# Patient Record
Sex: Male | Born: 1980
Health system: Southern US, Community
[De-identification: ages and names within clinical notes are randomized; demographics above are authoritative.]

## PROBLEM LIST (undated history)

## (undated) DIAGNOSIS — K600 Acute anal fissure: Secondary | ICD-10-CM

## (undated) DIAGNOSIS — A0471 Enterocolitis due to Clostridium difficile, recurrent: Secondary | ICD-10-CM

## (undated) DIAGNOSIS — G4730 Sleep apnea, unspecified: Secondary | ICD-10-CM

## (undated) DIAGNOSIS — K219 Gastro-esophageal reflux disease without esophagitis: Secondary | ICD-10-CM

## (undated) DIAGNOSIS — K648 Other hemorrhoids: Secondary | ICD-10-CM

## (undated) DIAGNOSIS — Z79899 Other long term (current) drug therapy: Secondary | ICD-10-CM

## (undated) DIAGNOSIS — M1990 Unspecified osteoarthritis, unspecified site: Secondary | ICD-10-CM

## (undated) DIAGNOSIS — M9110 Juvenile osteochondrosis of head of femur [Legg-Calve-Perthes], unspecified leg: Secondary | ICD-10-CM

## (undated) DIAGNOSIS — K5190 Ulcerative colitis, unspecified, without complications: Secondary | ICD-10-CM

## (undated) DIAGNOSIS — E559 Vitamin D deficiency, unspecified: Secondary | ICD-10-CM

## (undated) DIAGNOSIS — K611 Rectal abscess: Secondary | ICD-10-CM

## (undated) HISTORY — DX: Rectal abscess: K61.1

## (undated) HISTORY — PX: KNEE ARTHROSCOPY: SUR90

## (undated) HISTORY — DX: Unspecified osteoarthritis, unspecified site: M19.90

## (undated) HISTORY — DX: Acute anal fissure: K60.0

## (undated) HISTORY — PX: HIP SURGERY: SHX245

## (undated) HISTORY — DX: Other hemorrhoids: K64.8

## (undated) HISTORY — DX: Juvenile osteochondrosis of head of femur (Legg-Calve-Perthes), unspecified leg: M91.10

## (undated) HISTORY — PX: TYMPANOSTOMY TUBE PLACEMENT: SHX32

## (undated) HISTORY — DX: Ulcerative colitis, unspecified, without complications: K51.90

## (undated) HISTORY — DX: Vitamin D deficiency, unspecified: E55.9

## (undated) HISTORY — DX: Enterocolitis due to Clostridium difficile, recurrent: A04.71

## (undated) HISTORY — DX: Sleep apnea, unspecified: G47.30

## (undated) HISTORY — DX: Gastro-esophageal reflux disease without esophagitis: K21.9

---
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
## 1998-11-25 ENCOUNTER — Emergency Department (HOSPITAL_COMMUNITY): Admission: EM | Admit: 1998-11-25 | Discharge: 1998-11-25 | Payer: Self-pay | Admitting: Emergency Medicine

## 2001-11-05 ENCOUNTER — Encounter: Payer: Self-pay | Admitting: Orthopedic Surgery

## 2001-11-05 ENCOUNTER — Ambulatory Visit (HOSPITAL_COMMUNITY): Admission: RE | Admit: 2001-11-05 | Discharge: 2001-11-05 | Payer: Self-pay | Admitting: Orthopedic Surgery

## 2010-12-28 ENCOUNTER — Other Ambulatory Visit (HOSPITAL_COMMUNITY): Payer: Self-pay | Admitting: Orthopedic Surgery

## 2010-12-28 ENCOUNTER — Ambulatory Visit (HOSPITAL_COMMUNITY)
Admission: RE | Admit: 2010-12-28 | Discharge: 2010-12-28 | Disposition: A | Discharge status: Home / Self Care | Payer: Managed Care, Other (non HMO) | Source: Ambulatory Visit | Attending: Orthopedic Surgery | Admitting: Orthopedic Surgery

## 2010-12-28 DIAGNOSIS — Z77018 Contact with and (suspected) exposure to other hazardous metals: Secondary | ICD-10-CM

## 2010-12-28 DIAGNOSIS — Z1389 Encounter for screening for other disorder: Secondary | ICD-10-CM | POA: Insufficient documentation

## 2013-10-19 ENCOUNTER — Ambulatory Visit (INDEPENDENT_AMBULATORY_CARE_PROVIDER_SITE_OTHER): Payer: Managed Care, Other (non HMO) | Admitting: Emergency Medicine

## 2013-10-19 VITALS — BP 134/86 | HR 88 | Temp 98.6°F | Resp 18 | Ht 65.5 in | Wt 217.0 lb

## 2013-10-19 DIAGNOSIS — J018 Other acute sinusitis: Secondary | ICD-10-CM

## 2013-10-19 DIAGNOSIS — J209 Acute bronchitis, unspecified: Secondary | ICD-10-CM

## 2013-10-19 MED ORDER — PROMETHAZINE-CODEINE 6.25-10 MG/5ML PO SYRP: 5.0000 mL | ORAL_SOLUTION | Freq: Four times a day (QID) | ORAL | Status: DC | PRN

## 2013-10-19 MED ORDER — PSEUDOEPHEDRINE-GUAIFENESIN ER 60-600 MG PO TB12: 1.0000 | ORAL_TABLET | Freq: Two times a day (BID) | ORAL | Status: AC

## 2013-10-19 MED ORDER — AMOXICILLIN-POT CLAVULANATE 875-125 MG PO TABS: 1.0000 | ORAL_TABLET | Freq: Two times a day (BID) | ORAL | Status: DC

## 2013-10-19 NOTE — Patient Instructions (Signed)
Bronchitis Bronchitis is the body's way of reacting to injury and/or infection (inflammation) of the bronchi. Bronchi are the air tubes that extend from the windpipe into the lungs. If the inflammation becomes severe, it may cause shortness of breath. CAUSES  Inflammation may be caused by:  A virus.  Germs (bacteria).  Dust.  Allergens.  Pollutants and many other irritants. The cells lining the bronchial tree are covered with tiny hairs (cilia). These constantly beat upward, away from the lungs, toward the mouth. This keeps the lungs free of pollutants. When these cells become too irritated and are unable to do their job, mucus begins to develop. This causes the characteristic cough of bronchitis. The cough clears the lungs when the cilia are unable to do their job. Without either of these protective mechanisms, the mucus would settle in the lungs. Then you would develop pneumonia. Smoking is a common cause of bronchitis and can contribute to pneumonia. Stopping this habit is the single most important thing you can do to help yourself. TREATMENT   Your caregiver may prescribe an antibiotic if the cough is caused by bacteria. Also, medicines that open up your airways make it easier to breathe. Your caregiver may also recommend or prescribe an expectorant. It will loosen the mucus to be coughed up. Only take over-the-counter or prescription medicines for pain, discomfort, or fever as directed by your caregiver.  Removing whatever causes the problem (smoking, for example) is critical to preventing the problem from getting worse.  Cough suppressants may be prescribed for relief of cough symptoms.  Inhaled medicines may be prescribed to help with symptoms now and to help prevent problems from returning.  For those with recurrent (chronic) bronchitis, there may be a need for steroid medicines. SEEK IMMEDIATE MEDICAL CARE IF:   During treatment, you develop more pus-like mucus (purulent  sputum).  You have a fever.  You become progressively more ill.  You have increased difficulty breathing, wheezing, or shortness of breath. It is necessary to seek immediate medical care if you are elderly or sick from any other disease. MAKE SURE YOU:   Understand these instructions.  Will watch your condition.  Will get help right away if you are not doing well or get worse. Document Released: 10/07/2005 Document Revised: 06/09/2013 Document Reviewed: 06/01/2013 [facility]ExitCare Patient Information 2014 [place], [place]. Sinusitis Sinusitis is redness, soreness, and swelling (inflammation) of the paranasal sinuses. Paranasal sinuses are air pockets within the bones of your face (beneath the eyes, the middle of the forehead, or above the eyes). In healthy paranasal sinuses, mucus is able to drain out, and air is able to circulate through them by way of your nose. However, when your paranasal sinuses are inflamed, mucus and air can become trapped. This can allow bacteria and other germs to grow and cause infection. Sinusitis can develop quickly and last only a short time (acute) or continue over a long period (chronic). Sinusitis that lasts for more than 12 weeks is considered chronic.  CAUSES  Causes of sinusitis include:  Allergies.  Structural abnormalities, such as displacement of the cartilage that separates your nostrils (deviated septum), which can decrease the air flow through your nose and sinuses and affect sinus drainage.  Functional abnormalities, such as when the small hairs (cilia) that line your sinuses and help remove mucus do not work properly or are not present. SYMPTOMS  Symptoms of acute and chronic sinusitis are the same. The primary symptoms are pain and pressure around the affected sinuses. Other  symptoms include:  Upper toothache.  Earache.  Headache.  Bad breath.  Decreased sense of smell and taste.  A cough, which worsens when you are lying  flat.  Fatigue.  Fever.  Thick drainage from your nose, which often is green and may contain pus (purulent).  Swelling and warmth over the affected sinuses. DIAGNOSIS  Your caregiver will perform a physical exam. During the exam, your caregiver may:  Look in your nose for signs of abnormal growths in your nostrils (nasal polyps).  Tap over the affected sinus to check for signs of infection.  View the inside of your sinuses (endoscopy) with a special imaging device with a light attached (endoscope), which is inserted into your sinuses. If your caregiver suspects that you have chronic sinusitis, one or more of the following tests may be recommended:  Allergy tests.  Nasal culture A sample of mucus is taken from your nose and sent to a lab and screened for bacteria.  Nasal cytology A sample of mucus is taken from your nose and examined by your caregiver to determine if your sinusitis is related to an allergy. TREATMENT  Most cases of acute sinusitis are related to a viral infection and will resolve on their own within 10 days. Sometimes medicines are prescribed to help relieve symptoms (pain medicine, decongestants, nasal steroid sprays, or saline sprays).  However, for sinusitis related to a bacterial infection, your caregiver will prescribe antibiotic medicines. These are medicines that will help kill the bacteria causing the infection.  Rarely, sinusitis is caused by a fungal infection. In theses cases, your caregiver will prescribe antifungal medicine. For some cases of chronic sinusitis, surgery is needed. Generally, these are cases in which sinusitis recurs more than 3 times per year, despite other treatments. HOME CARE INSTRUCTIONS   Drink plenty of water. Water helps thin the mucus so your sinuses can drain more easily.  Use a humidifier.  Inhale steam 3 to 4 times a day (for example, sit in the bathroom with the shower running).  Apply a warm, moist washcloth to your face 3  to 4 times a day, or as directed by your caregiver.  Use saline nasal sprays to help moisten and clean your sinuses.  Take over-the-counter or prescription medicines for pain, discomfort, or fever only as directed by your caregiver. SEEK IMMEDIATE MEDICAL CARE IF:  You have increasing pain or severe headaches.  You have nausea, vomiting, or drowsiness.  You have swelling around your face.  You have vision problems.  You have a stiff neck.  You have difficulty breathing. MAKE SURE YOU:   Understand these instructions.  Will watch your condition.  Will get help right away if you are not doing well or get worse. Document Released: 10/07/2005 Document Revised: 12/30/2011 Document Reviewed: 10/22/2011 [facility]ExitCare Patient Information 2014 [place]ExitCare, [place].

## 2013-10-19 NOTE — Progress Notes (Signed)
[facility] [address], [place] [address] [phone]  Date:  10/19/2013   Name:  John [name]   DOB:  08/07/1981   MRN:  [id number]  PCP:  No PCP Per Patient    Chief Complaint: URI   History of Present Illness:  John [name] is a 32 y.o. very pleasant male patient who presents with the following:  Ill since Saturday with cough and fever.  No myalgias or arthralgias.  Since Sunday has experienced a sore throat with odynophagia.  No nausea or vomiting.  No wheezing or shortness of breath.  Cough is productive a mucopurulent sputum.   Says he swallowed and felt something "pop" in his right neck that still hurts when he swallows.  No improvement with over the counter medications or other home remedies. Denies other complaint or health concern today.   There are no active problems to display for this patient.   History reviewed. No pertinent past medical history.  History reviewed. No pertinent past surgical history.  History  Substance Use Topics  . Smoking status: Never Smoker   . Smokeless tobacco: Not on file  . Alcohol Use: No    Family History  Problem Relation Age of Onset  . Diabetes Father   . Cancer Father   . Heart disease Father     No Known Allergies  Medication list has been reviewed and updated.  No current outpatient prescriptions on file prior to visit.   No current facility-administered medications on file prior to visit.    Review of Systems:  As per HPI, otherwise negative.    Physical Examination: Filed Vitals:   10/19/13 0900  BP: 134/86  Pulse: 88  Temp: 98.6 F (37 C)  Resp: 18   Filed Vitals:   10/19/13 0900  Height: 5' 5.5" (1.664 m)  Weight: 217 lb (98.431 kg)   Body mass index is 35.55 kg/(m^2). Ideal Body Weight: Weight in (lb) to have BMI = 25: 152.2  GEN: WDWN, NAD, Non-toxic, A & O x 3 HEENT: Atraumatic, Normocephalic. Neck supple. No masses, No LAD. Ears and Nose: No external deformity.   Purulent nasal drainage CV: RRR, No M/G/R. No JVD. No thrill. No extra heart sounds. PULM: CTA B, no wheezes, crackles, rhonchi. No retractions. No resp. distress. No accessory muscle use. ABD: S, NT, ND, +BS. No rebound. No HSM. EXTR: No c/c/e NEURO Normal gait.  PSYCH: Normally interactive. Conversant. Not depressed or anxious appearing.  Calm demeanor.    Assessment and Plan: Sinusitis Bronchitis augmentin mucinex d Phen c cod  Signed,  Phillips Odor, MD

## 2013-11-15 ENCOUNTER — Encounter: Payer: Self-pay | Admitting: Family Medicine

## 2013-11-15 ENCOUNTER — Ambulatory Visit (INDEPENDENT_AMBULATORY_CARE_PROVIDER_SITE_OTHER): Payer: Managed Care, Other (non HMO) | Admitting: Family Medicine

## 2013-11-15 VITALS — BP 116/80 | HR 74 | Temp 98.1°F | Resp 16 | Ht 65.0 in | Wt 213.6 lb

## 2013-11-15 DIAGNOSIS — R0683 Snoring: Secondary | ICD-10-CM

## 2013-11-15 DIAGNOSIS — R0989 Other specified symptoms and signs involving the circulatory and respiratory systems: Secondary | ICD-10-CM

## 2013-11-15 DIAGNOSIS — G4710 Hypersomnia, unspecified: Secondary | ICD-10-CM

## 2013-11-15 DIAGNOSIS — R400 Somnolence: Secondary | ICD-10-CM

## 2013-11-15 DIAGNOSIS — R5383 Other fatigue: Secondary | ICD-10-CM

## 2013-11-15 DIAGNOSIS — Z0000 Encounter for general adult medical examination without abnormal findings: Secondary | ICD-10-CM

## 2013-11-15 DIAGNOSIS — R0609 Other forms of dyspnea: Secondary | ICD-10-CM

## 2013-11-15 DIAGNOSIS — Z23 Encounter for immunization: Secondary | ICD-10-CM

## 2013-11-15 DIAGNOSIS — R5381 Other malaise: Secondary | ICD-10-CM

## 2013-11-15 LAB — CBC
HCT: 42.8 % (ref 39.0–52.0)
Hemoglobin: 14.9 g/dL (ref 13.0–17.0)
MCH: 30.2 pg (ref 26.0–34.0)
MCHC: 34.8 g/dL (ref 30.0–36.0)
MCV: 86.8 fL (ref 78.0–100.0)
Platelets: 287 10*3/uL (ref 150–400)
RBC: 4.93 MIL/uL (ref 4.22–5.81)
RDW: 13.8 % (ref 11.5–15.5)
WBC: 8.5 10*3/uL (ref 4.0–10.5)

## 2013-11-15 NOTE — Progress Notes (Signed)
Subjective:    Patient ID: John [name], male    DOB: 1981/09/28, 33 y.o.   MRN: [id number]  HPI John [name] is a 33 y.o. male  Here for CPE/annual exam. Fasting currently.   Tetanus/Tdap: over 10 years.  Flu vaccine: declines, no questions or concerns why. Just does not want done.  Dentist: last week. Optho/eye care eval: wears glasses. Due for exam. Last seen about a year ago.    FH: HTN and DM2 in both sides of family. Has not had physical recently. Father diagnosed with colon CA at 31yo.  Granfather with prostate cancer in late 60's-70's.   Hx of seasonal allergies - otc antihistamine if needed   Fatigue - all the time - past year. Does snore, but no known pauses. Daytime somnolence past year and half. Sleeping 7-8 hours per night sleep. Wakes up few times at night if hears noises. Weight 2-5 to 215 past 6-7 years. Denies depression, no other stressors.   There are no active problems to display for this patient.  No past medical history on file. No past surgical history on file. No Known Allergies Prior to Admission medications   Medication Sig Start Date End Date Taking? Authorizing Provider  amoxicillin-clavulanate (AUGMENTIN) 875-125 MG per tablet Take 1 tablet by mouth 2 (two) times daily. 10/19/13   Ellison Carwin, MD  promethazine-codeine (PHENERGAN WITH CODEINE) 6.25-10 MG/5ML syrup Take 5-10 mLs by mouth every 6 (six) hours as needed. 10/19/13   Ellison Carwin, MD  pseudoephedrine-guaifenesin [facility] D) 60-600 MG per tablet Take 1 tablet by mouth every 12 (twelve) hours. 10/19/13 10/19/14  Ellison Carwin, MD   History   Social History  . Marital Status: Married    Spouse Name: N/A    Number of Children: N/A  . Years of Education: N/A   Occupational History  . Not on file.   Social History Main Topics  . Smoking status: Never Smoker   . Smokeless tobacco: Not on file  . Alcohol Use: No  . Drug Use: No  . Sexual Activity: Yes   Other Topics  Concern  . Not on file   Social History Narrative  . 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 Nonsmoker. Married 70 years, 58 month old girl at home. No alcohol, no IDU.  Denies extramarital sexual contact, declines STI.    Review of Systems 13 point review of systems per patient health survey noted.  Negative other than as indicated on reviewed nursing note or above.      Objective:   Physical Exam  Vitals reviewed. Constitutional: He is oriented to person, place, and time. He appears well-developed and well-nourished.  HENT:  Head: Normocephalic and atraumatic.  Right Ear: External ear normal.  Left Ear: External ear normal.  Mouth/Throat: Oropharynx is clear and moist.  Eyes: Conjunctivae and EOM are normal. Pupils are equal, round, and reactive to light.  Neck: Normal range of motion. Neck supple. No thyromegaly present.  Cardiovascular: Normal rate, regular rhythm, normal heart sounds and intact distal pulses.   Pulmonary/Chest: Effort normal and breath sounds normal. No respiratory distress. He has no wheezes.  Abdominal: Soft. He exhibits no distension. There is no tenderness. Hernia confirmed negative in the right inguinal area and confirmed negative in the left inguinal area.  Musculoskeletal: Normal range of motion. He exhibits no edema and no tenderness.  Lymphadenopathy:    He has no cervical adenopathy.  Neurological: He is alert and oriented to person,  place, and time. He has normal reflexes.  Skin: Skin is warm and dry.  Psychiatric: He has a normal mood and affect. His behavior is normal.   Filed Vitals:   11/15/13 1503  BP: 116/80  Pulse: 74  Temp: 98.1 F (36.7 C)  TempSrc: Oral  Resp: 16  Height: 5\' 5"  (1.651 m)  Weight: 213 lb 9.6 oz (96.888 kg)  SpO2: 97%    Visual Acuity Screening   Right eye Left eye Both eyes  Without correction:     With correction: 20/20 20/20 20/20        Assessment & Plan:  John [name] is a 33 y.o.  male Snoring Daytime somnolence - Plan: Ambulatory referral to Sleep Studies for possible OSA. Return after this if fatigue persists.  Will check TSH, CBC in the meantime.   Need for Tdap vaccination - Plan: Tdap vaccine greater than or equal to 7yo IM given.   Annual physical exam - Plan: COMPLETE METABOLIC PANEL WITH GFR, Lipid panel, TSH, CBC, -anticipatory guidance provided below, labs above. Declined STI testing.    Patient Instructions  You should receive a call or letter about your lab results within the next week to 10 days.  tdap given today - repeat in 10 years (or after 5 years with injury).  We will refer you to sleep studies for evaluation for sleep apnea.  Return to the clinic or go to the nearest emergency room if any of your symptoms worsen or new symptoms occur.  Keeping you healthy  Get these tests  Blood pressure- Have your blood pressure checked once a year by your healthcare provider.  Normal blood pressure is 120/80.  Weight- Have your body mass index (BMI) calculated to screen for obesity.  BMI is a measure of body fat based on height and weight. You can also calculate your own BMI at [employer]velBags.it.  Cholesterol- Have your cholesterol checked regularly starting at age 35, sooner may be necessary if you have diabetes, high blood pressure, if a family member developed heart diseases at an early age or if you smoke.   Chlamydia, HIV, and other sexual transmitted disease- Get screened each year until the age of 34 then within three months of each new sexual partner.  Diabetes- Have your blood sugar checked regularly if you have high blood pressure, high cholesterol, a family history of diabetes or if you are overweight.  Get these vaccines  Flu shot- Every fall.  Tetanus shot- Every 10 years.  Menactra- Single dose; prevents meningitis.  Take these steps  Don't smoke- If you do smoke, ask your healthcare provider about quitting. For tips on how to  quit, go to www.smokefree.gov or call 1-800-QUIT-NOW.  Be physically active- Exercise 5 days a week for at least 30 minutes.  If you are not already physically active start slow and gradually work up to 30 minutes of moderate physical activity.  Examples of moderate activity include walking briskly, mowing the yard, dancing, swimming bicycling, etc.  Eat a healthy diet- Eat a variety of healthy foods such as fruits, vegetables, low fat milk, low fat cheese, yogurt, lean meats, poultry, fish, beans, tofu, etc.  For more information on healthy eating, go to www.thenutritionsource.org  Drink alcohol in moderation- Limit alcohol intake two drinks or less a day.  Never drink and drive.  Dentist- Brush and floss teeth twice daily; visit your dentis twice a year.  Depression-Your emotional health is as important as your physical health.  If you're  feeling down, losing interest in things you normally enjoy please talk with your healthcare provider.  Gun Safety- If you keep a gun in your home, keep it unloaded and with the safety lock on.  Bullets should be stored separately.  Helmet use- Always wear a helmet when riding a motorcycle, bicycle, rollerblading or skateboarding.  Safe sex- If you may be exposed to a sexually transmitted infection, use a condom  Seat belts- Seat bels can save your life; always wear one.  Smoke/Carbon Monoxide detectors- These detectors need to be installed on the appropriate level of your home.  Replace batteries at least once a year.  Skin Cancer- When out in the sun, cover up and use sunscreen SPF 15 or higher.  Violence- If anyone is threatening or hurting you, please tell your healthcare provider.

## 2013-11-15 NOTE — Patient Instructions (Signed)
You should receive a call or letter about your lab results within the next week to 10 days.  tdap given today - repeat in 10 years (or after 5 years with injury).  We will refer you to sleep studies for evaluation for sleep apnea.  Return to the clinic or go to the nearest emergency room if any of your symptoms worsen or new symptoms occur.  Keeping you healthy  Get these tests  Blood pressure- Have your blood pressure checked once a year by your healthcare provider.  Normal blood pressure is 120/80.  Weight- Have your body mass index (BMI) calculated to screen for obesity.  BMI is a measure of body fat based on height and weight. You can also calculate your own BMI at [employer]ags.it.  Cholesterol- Have your cholesterol checked regularly starting at age 43, sooner may be necessary if you have diabetes, high blood pressure, if a family member developed heart diseases at an early age or if you smoke.   Chlamydia, HIV, and other sexual transmitted disease- Get screened each year until the age of 65 then within three months of each new sexual partner.  Diabetes- Have your blood sugar checked regularly if you have high blood pressure, high cholesterol, a family history of diabetes or if you are overweight.  Get these vaccines  Flu shot- Every fall.  Tetanus shot- Every 10 years.  Menactra- Single dose; prevents meningitis.  Take these steps  Don't smoke- If you do smoke, ask your healthcare provider about quitting. For tips on how to quit, go to www.smokefree.gov or call 1-800-QUIT-NOW.  Be physically active- Exercise 5 days a week for at least 30 minutes.  If you are not already physically active start slow and gradually work up to 30 minutes of moderate physical activity.  Examples of moderate activity include walking briskly, mowing the yard, dancing, swimming bicycling, etc.  Eat a healthy diet- Eat a variety of healthy foods such as fruits, vegetables, low fat milk, low  fat cheese, yogurt, lean meats, poultry, fish, beans, tofu, etc.  For more information on healthy eating, go to www.thenutritionsource.org  Drink alcohol in moderation- Limit alcohol intake two drinks or less a day.  Never drink and drive.  Dentist- Brush and floss teeth twice daily; visit your dentis twice a year.  Depression-Your emotional health is as important as your physical health.  If you're feeling down, losing interest in things you normally enjoy please talk with your healthcare provider.  Gun Safety- If you keep a gun in your home, keep it unloaded and with the safety lock on.  Bullets should be stored separately.  Helmet use- Always wear a helmet when riding a motorcycle, bicycle, rollerblading or skateboarding.  Safe sex- If you may be exposed to a sexually transmitted infection, use a condom  Seat belts- Seat bels can save your life; always wear one.  Smoke/Carbon Monoxide detectors- These detectors need to be installed on the appropriate level of your home.  Replace batteries at least once a year.  Skin Cancer- When out in the sun, cover up and use sunscreen SPF 15 or higher.  Violence- If anyone is threatening or hurting you, please tell your healthcare provider.

## 2013-11-15 NOTE — Progress Notes (Signed)
   Subjective:    Patient ID: John [name], male    DOB: [birth year], 33 y.o.   MRN: [id number]  HPI    Review of Systems  Constitutional: Positive for activity change and fatigue.  HENT: Negative.   Eyes: Negative.   Respiratory: Negative.   Cardiovascular: Negative.   Gastrointestinal: Negative.   Endocrine: Negative.   Genitourinary: Negative.   Musculoskeletal: Negative.   Skin: Negative.   Allergic/Immunologic: Positive for environmental allergies.  Neurological: Negative.   Hematological: Negative.   Psychiatric/Behavioral: Negative.        Objective:   Physical Exam        Assessment & Plan:

## 2013-11-16 LAB — COMPLETE METABOLIC PANEL WITH GFR
ALT: 34 U/L (ref 0–53)
AST: 28 U/L (ref 0–37)
Albumin: 5 g/dL (ref 3.5–5.2)
Alkaline Phosphatase: 56 U/L (ref 39–117)
BUN: 13 mg/dL (ref 6–23)
CO2: 24 mEq/L (ref 19–32)
Calcium: 10.2 mg/dL (ref 8.4–10.5)
Chloride: 101 mEq/L (ref 96–112)
Creat: 0.92 mg/dL (ref 0.50–1.35)
GFR, Est African American: >89 mL/min
GFR, Est Non African American: >89 mL/min
Glucose, Bld: 83 mg/dL (ref 70–99)
Potassium: 4.2 mEq/L (ref 3.5–5.3)
Sodium: 140 mEq/L (ref 135–145)
Total Bilirubin: 1.3 mg/dL — ABNORMAL HIGH (ref 0.3–1.2)
Total Protein: 8.4 g/dL — ABNORMAL HIGH (ref 6.0–8.3)

## 2013-11-16 LAB — LIPID PANEL
Cholesterol: 187 mg/dL (ref 0–200)
HDL: 33 mg/dL — ABNORMAL LOW (ref >39)
LDL Cholesterol: 105 mg/dL — ABNORMAL HIGH (ref 0–99)
Total CHOL/HDL Ratio: 5.7 Ratio
Triglycerides: 243 mg/dL — ABNORMAL HIGH (ref <150)
VLDL: 49 mg/dL — ABNORMAL HIGH (ref 0–40)

## 2013-11-16 LAB — TSH: TSH: 3.324 u[IU]/mL (ref 0.350–4.500)

## 2014-06-07 ENCOUNTER — Encounter: Payer: Self-pay | Admitting: *Deleted

## 2014-06-08 ENCOUNTER — Ambulatory Visit (INDEPENDENT_AMBULATORY_CARE_PROVIDER_SITE_OTHER): Payer: Managed Care, Other (non HMO) | Admitting: Internal Medicine

## 2014-06-08 ENCOUNTER — Encounter: Payer: Self-pay | Admitting: Internal Medicine

## 2014-06-08 VITALS — BP 118/74 | HR 84 | Ht 65.0 in | Wt 217.0 lb

## 2014-06-08 DIAGNOSIS — Z800 Family history of malignant neoplasm of digestive organs: Secondary | ICD-10-CM

## 2014-06-08 DIAGNOSIS — K219 Gastro-esophageal reflux disease without esophagitis: Secondary | ICD-10-CM

## 2014-06-08 DIAGNOSIS — K648 Other hemorrhoids: Secondary | ICD-10-CM

## 2014-06-08 HISTORY — DX: Gastro-esophageal reflux disease without esophagitis: K21.9

## 2014-06-08 HISTORY — DX: Other hemorrhoids: K64.8

## 2014-06-08 MED ORDER — HYDROCORTISONE ACETATE 25 MG RE SUPP: 25.0000 mg | Freq: Every day | RECTAL | Status: DC

## 2014-06-08 NOTE — Progress Notes (Signed)
Subjective:    Patient ID: John [name], male    DOB: 1981-01-11, 33 y.o.   MRN: [id number]  HPI John is here with c/o intermittent streaking of bright red blood on stool for a few months. No constipation, strainig, etc. Has some concern because father ad grandfather had colon cancer. Has not had a colonoscopy yet.  No Known Allergies Outpatient Prescriptions Prior to Visit  Medication Sig Dispense Refill  . amoxicillin-clavulanate (AUGMENTIN) 875-125 MG per tablet Take 1 tablet by mouth 2 (two) times daily.  20 tablet  0  . promethazine-codeine (PHENERGAN WITH CODEINE) 6.25-10 MG/5ML syrup Take 5-10 mLs by mouth every 6 (six) hours as needed.  120 mL  0  . pseudoephedrine-guaifenesin (MUCINEX D) 60-600 MG per tablet Take 1 tablet by mouth every 12 (twelve) hours.  18 tablet  0   No facility-administered medications prior to visit.   Past Medical History  Diagnosis Date  . GERD (gastroesophageal reflux disease)   . 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 Esophageal reflux 06/08/2014  . Hemorrhoids, internal, with bleeding 06/08/2014   Past Surgical History  Procedure Laterality Date  . Knee arthroscopy Left    History   Social History  . Marital Status: Married    Spouse Name: N/A    Number of Children: 1  . Years of Education: N/A   Occupational History  . Diesel Statistician   Social History Main Topics  . Smoking status: Never Smoker   . Smokeless tobacco: Never Used  . Alcohol Use: No  . Drug Use: No  . Sexual Activity: Yes          Social History Narrative   Married (Dottie [place] CMA [facility]), 1 daughter born 2014   Engineer, building services [place]   Enjoys softball   Family History  Problem Relation Age of Onset  . Diabetes Father   . Colon cancer Father   . Heart disease Father   . Hyperlipidemia Father   . Heart disease Mother   . Hyperlipidemia Mother   . Heart disease Brother   . Heart disease Maternal Grandmother   . Heart disease  Maternal Grandfather   . Mental illness Maternal Grandfather   . Heart disease Paternal Grandmother   . Heart disease Paternal Grandfather   . Colon cancer Paternal Grandfather    Review of Systems URI - on Tx    Objective:   Physical Exam General:  Well-developed, well-nourished and in no acute distress Eyes:  anicteric. Lungs: Clear to auscultation bilaterally. Heart:  S1S2, no rubs, murmurs, gallops. Abdomen:  soft, non-tender, no hepatosplenomegaly, hernia, or mass and BS+.   Rectal: NL anoderm, slightly increased tone, no mass, prostate NL  Neuro:  A&O x 3.  Psych:  appropriate mood and  Affect.   Anoscopy was performed with the patient in the left lateral decubitus positionand revealed Grade I internal hemorrhoids all positions  Data Reviewed: Lab Results  Component Value Date   WBC 8.5 11/15/2013   HGB 14.9 11/15/2013   HCT 42.8 11/15/2013   MCV 86.8 11/15/2013   PLT 287 11/15/2013       Assessment & Plan:  Hemorrhoids, internal, with bleeding Grade 1 all 3 positions Treat w/ HC suppository Reserve banding for recurrent and persistent problems  Family history of colon cancer - father and paternal grandfather Colonoscopy by 52 - with recent sxs reasonable to do now though I think bleeding hemorrhoidal. I told  him to let me know if he wants to schedule and that if bleeding does not stop with [facility] suppositories then definitely need to schedule colonoscopy.  Esophageal reflux Intermittent sxs Try diet change with reduction of caffeine, prn antacids for now

## 2014-06-08 NOTE — Patient Instructions (Signed)
Today you have been given a handout to read on hemorrhoids.  We have sent the following medications to your pharmacy for you to pick up at your convenience: Rectal suppositories  Try to decrease your caffeine consumption.  If you decide to do a colonoscopy let us know and we will get this set up for you.   I appreciate the opportunity to care for you.

## 2014-06-09 DIAGNOSIS — Z800 Family history of malignant neoplasm of digestive organs: Secondary | ICD-10-CM | POA: Insufficient documentation

## 2014-06-09 NOTE — Assessment & Plan Note (Signed)
Grade 1 all 3 positions Treat w/ HC suppository Reserve banding for recurrent and persistent problems

## 2014-06-09 NOTE — Assessment & Plan Note (Signed)
Intermittent sxs Try diet change with reduction of caffeine, prn antacids for now

## 2014-06-09 NOTE — Assessment & Plan Note (Signed)
Colonoscopy by 23 - with recent sxs reasonable to do now though I think bleeding hemorrhoidal. I told him to let me know if he wants to schedule and that if bleeding does not stop with HC suppositories then definitely need to schedule colonoscopy.

## 2014-09-28 ENCOUNTER — Other Ambulatory Visit: Payer: Self-pay

## 2014-09-28 DIAGNOSIS — K625 Hemorrhage of anus and rectum: Secondary | ICD-10-CM

## 2014-09-28 MED ORDER — PEG-KCL-NACL-NASULF-NA ASC-C 100 G PO SOLR: 1.0000 | Freq: Once | ORAL | Status: AC

## 2014-10-20 ENCOUNTER — Encounter: Payer: Self-pay | Admitting: Internal Medicine

## 2014-11-11 ENCOUNTER — Encounter: Payer: Managed Care, Other (non HMO) | Admitting: Internal Medicine

## 2014-11-18 ENCOUNTER — Encounter: Payer: Self-pay | Admitting: Internal Medicine

## 2014-11-18 ENCOUNTER — Ambulatory Visit (AMBULATORY_SURGERY_CENTER): Payer: Managed Care, Other (non HMO) | Admitting: Internal Medicine

## 2014-11-18 VITALS — BP 124/76 | HR 79 | Temp 99.2°F | Resp 17 | Ht 65.0 in | Wt 217.0 lb

## 2014-11-18 DIAGNOSIS — Z800 Family history of malignant neoplasm of digestive organs: Secondary | ICD-10-CM

## 2014-11-18 DIAGNOSIS — Z1211 Encounter for screening for malignant neoplasm of colon: Secondary | ICD-10-CM

## 2014-11-18 MED ORDER — SODIUM CHLORIDE 0.9 % IV SOLN: 500.0000 mL | INTRAVENOUS | Status: DC

## 2014-11-18 NOTE — Patient Instructions (Signed)
YOU HAD AN ENDOSCOPIC PROCEDURE TODAY AT THE [facility]: Refer to the procedure report that was given to you for any specific questions about what was found during the examination.  If the procedure report does not answer your questions, please call your gastroenterologist to clarify.  If you requested that your care partner not be given the details of your procedure findings, then the procedure report has been included in a sealed envelope for you to review at your convenience later.  YOU SHOULD EXPECT: Some feelings of bloating in the abdomen. Passage of more gas than usual.  Walking can help get rid of the air that was put into your GI tract during the procedure and reduce the bloating. If you had a lower endoscopy (such as a colonoscopy or flexible sigmoidoscopy) you may notice spotting of blood in your stool or on the toilet paper. If you underwent a bowel prep for your procedure, then you may not have a normal bowel movement for a few days.  DIET: Your first meal following the procedure should be a light meal and then it is ok to progress to your normal diet.  A half-sandwich or bowl of soup is an example of a good first meal.  Heavy or fried foods are harder to digest and may make you feel nauseous or bloated.  Likewise meals heavy in dairy and vegetables can cause extra gas to form and this can also increase the bloating.  Drink plenty of fluids but you should avoid alcoholic beverages for 24 hours.  ACTIVITY: Your care partner should take you home directly after the procedure.  You should plan to take it easy, moving slowly for the rest of the day.  You can resume normal activity the day after the procedure however you should NOT DRIVE or use heavy machinery for 24 hours (because of the sedation medicines used during the test).    SYMPTOMS TO REPORT IMMEDIATELY: A gastroenterologist can be reached at any hour.  During normal business hours, 8:30 AM to 5:00 PM Monday through Friday,  call [phone].  After hours and on weekends, please call the GI answering service at [phone] who will take a message and have the physician on call contact you.   Following lower endoscopy (colonoscopy or flexible sigmoidoscopy):  Excessive amounts of blood in the stool  Significant tenderness or worsening of abdominal pains  Swelling of the abdomen that is new, acute  Fever of 100F or higher    FOLLOW UP: If any biopsies were taken you will be contacted by phone or by letter within the next 1-3 weeks.  Call your gastroenterologist if you have not heard about the biopsies in 3 weeks.  Our staff will call the home number listed on your records the next business day following your procedure to check on you and address any questions or concerns that you may have at that time regarding the information given to you following your procedure. This is a courtesy call and so if there is no answer at the home number and we have not heard from you through the emergency physician on call, we will assume that you have returned to your regular daily activities without incident.  SIGNATURES/CONFIDENTIALITY: You and/or your care partner have signed paperwork which will be entered into your electronic medical record.  These signatures attest to the fact that that the information above on your After Visit Summary has been reviewed and is understood.  Full responsibility of the confidentiality   of this discharge information lies with you and/or your care-partner.     

## 2014-11-18 NOTE — Op Note (Signed)
[place]  [address] [place], [id number]   COLONOSCOPY PROCEDURE REPORT  PATIENT: [name], John A  MR#: [id number] BIRTHDATE: 1981-03-27 , 33  yrs. old GENDER: male ENDOSCOPIST: Gatha Mayer, MD, [facility] PROCEDURE DATE:  11/18/2014 PROCEDURE:   Colonoscopy, screening First Screening Colonoscopy - Avg.  risk and is 50 yrs.  old or older Yes.  Prior Negative Screening - Now for repeat screening. N/A  History of Adenoma - Now for follow-up colonoscopy & has been > or = to 3 yrs.  N/A  Polyps Removed Today? No.  Polyps Removed Today? No.  Recommend repeat exam, <10 yrs? Polyps Removed Today? No.  Recommend repeat exam, <10 yrs? Yes.  Polyps Removed Today? No.  Recommend repeat exam, <10 yrs? Yes.  High risk (family or personal hx). ASA CLASS:   Class II INDICATIONS:first colonoscopy and patient's immediate family history of colon cancer. MEDICATIONS: Propofol 300 mg IV and Monitored anesthesia care  DESCRIPTION OF PROCEDURE:   After the risks benefits and alternatives of the procedure were thoroughly explained, informed consent was obtained.  The digital rectal exam revealed no abnormalities of the rectum, revealed no prostatic nodules, and revealed the prostate was not enlarged.   The LB [id number] endoscope was introduced through the anus and advanced to the cecum, which was identified by both the appendix and ileocecal valve. No adverse events experienced.   The quality of the prep was excellent, using MoviPrep  The instrument was then slowly withdrawn as the colon was fully examined.      COLON FINDINGS: The colonic mucosa appeared normal throughout. Small internal hemorrhoids were found in the rectum.Marland Kitchen  Retroflexed views rectal and right colon revealed no new abnormalities. The time to cecum=2 minutes 25 seconds.  Withdrawal time=9 minutes 32 seconds. The scope was withdrawn and the procedure completed. COMPLICATIONS: There were no immediate  complications.  ENDOSCOPIC IMPRESSION: 1.   The colonic mucosa appeared normal w/ excellent prep 2.   Small internal hemorrhoids in rectum  RECOMMENDATIONS: 1.  Repeat colonoscopy in 5 years. 2.  Consider hemorrhoid banding if desired  eSigned:  Gatha Mayer, MD, [facility] 11/18/2014 2:23 PM   cc: The Patient

## 2014-11-18 NOTE — Progress Notes (Signed)
Procedure ends, to recovery, report given and VSS. 

## 2014-11-21 ENCOUNTER — Telehealth: Payer: Self-pay

## 2014-11-21 NOTE — Telephone Encounter (Signed)
  Follow up Call-  Call back number 11/18/2014  Post procedure Call Back phone  # [phone] or call Dottie on 3rd floor  Permission to leave phone message No     Patient questions:  Do you have a fever, pain , or abdominal swelling? No. Pain Score  0 *  Have you tolerated food without any problems? Yes.    Have you been able to return to your normal activities? Yes.    Do you have any questions about your discharge instructions: Diet   No. Medications  No. Follow up visit  No.  Do you have questions or concerns about your Care? No.  Actions: * If pain score is 4 or above: No action needed, pain <4.

## 2015-04-03 ENCOUNTER — Encounter: Payer: Self-pay | Admitting: Physician Assistant

## 2015-04-03 ENCOUNTER — Encounter (INDEPENDENT_AMBULATORY_CARE_PROVIDER_SITE_OTHER): Payer: Self-pay

## 2015-04-03 ENCOUNTER — Telehealth: Payer: Self-pay | Admitting: Internal Medicine

## 2015-04-03 ENCOUNTER — Ambulatory Visit (INDEPENDENT_AMBULATORY_CARE_PROVIDER_SITE_OTHER): Payer: Managed Care, Other (non HMO) | Admitting: Physician Assistant

## 2015-04-03 ENCOUNTER — Other Ambulatory Visit (INDEPENDENT_AMBULATORY_CARE_PROVIDER_SITE_OTHER): Payer: Managed Care, Other (non HMO)

## 2015-04-03 VITALS — BP 128/68 | HR 80 | Ht 65.0 in | Wt 218.1 lb

## 2015-04-03 DIAGNOSIS — K649 Unspecified hemorrhoids: Secondary | ICD-10-CM

## 2015-04-03 DIAGNOSIS — R197 Diarrhea, unspecified: Secondary | ICD-10-CM

## 2015-04-03 DIAGNOSIS — R195 Other fecal abnormalities: Secondary | ICD-10-CM | POA: Diagnosis not present

## 2015-04-03 LAB — CBC WITH DIFFERENTIAL/PLATELET
Basophils Absolute: 0 10*3/uL (ref 0.0–0.1)
Basophils Relative: 0.6 % (ref 0.0–3.0)
Eosinophils Absolute: 0.4 10*3/uL (ref 0.0–0.7)
Eosinophils Relative: 6.5 % — ABNORMAL HIGH (ref 0.0–5.0)
HCT: 36 % — ABNORMAL LOW (ref 39.0–52.0)
Hemoglobin: 12 g/dL — ABNORMAL LOW (ref 13.0–17.0)
Lymphocytes Relative: 31.3 % (ref 12.0–46.0)
Lymphs Abs: 2.1 10*3/uL (ref 0.7–4.0)
MCHC: 33.3 g/dL (ref 30.0–36.0)
MCV: 89.3 fl (ref 78.0–100.0)
Monocytes Absolute: 0.7 10*3/uL (ref 0.1–1.0)
Monocytes Relative: 9.7 % (ref 3.0–12.0)
Neutro Abs: 3.5 10*3/uL (ref 1.4–7.7)
Neutrophils Relative %: 51.9 % (ref 43.0–77.0)
Platelets: 360 10*3/uL (ref 150.0–400.0)
RBC: 4.03 Mil/uL — ABNORMAL LOW (ref 4.22–5.81)
RDW: 14.6 % (ref 11.5–15.5)
WBC: 6.8 10*3/uL (ref 4.0–10.5)

## 2015-04-03 LAB — TSH: TSH: 5.21 u[IU]/mL — ABNORMAL HIGH (ref 0.35–4.50)

## 2015-04-03 LAB — IGA: IgA: 205 mg/dL (ref 68–378)

## 2015-04-03 MED ORDER — SACCHAROMYCES BOULARDII 250 MG PO CAPS: 250.0000 mg | ORAL_CAPSULE | Freq: Two times a day (BID) | ORAL | Status: DC

## 2015-04-03 MED ORDER — DICYCLOMINE HCL 20 MG PO TABS: 20.0000 mg | ORAL_TABLET | Freq: Two times a day (BID) | ORAL | Status: DC

## 2015-04-03 MED ORDER — HYDROCORTISONE ACETATE 25 MG RE SUPP: 25.0000 mg | Freq: Two times a day (BID) | RECTAL | Status: DC

## 2015-04-03 NOTE — Progress Notes (Signed)
Patient ID: John [name], male   DOB: 03/18/1981, 34 y.o.   MRN: [id number]     History of Present Illness: John [name] is a pleasant 34 year old male known to Dr. Carlean Purl. He recently had a colonoscopy due to a family history of colon cancer. The colonoscopy was performed on 11/18/2014. The colonic mucosa appeared normal. He was noted to have small internal hemorrhoids in the rectum. He was advised to repeat a colonoscopy in 5 years. He is here today with complaints of diarrhea of one month's duration. He is having diarrhea anywhere from 3-10 times daily. His diarrhea is watery to mushy in consistency and moderate to large in volume. His diarrhea is very strong smelling. He has no nocturnal stooling. He has no urinary hesitancy or urgency. He has no fever or chills or night sweats. He has no nausea or vomiting. He has not used NSAIDs. He tried Imodium which slowed down but then he felt constipated and the diarrhea came back. He has no mucus with his stools. His appetite is good. His weight has been stable. Prior to the onset he had not had any biotics nor has he traveled outside of the country or required any new pets. He does have well water   Past Medical History  Diagnosis Date  . GERD (gastroesophageal reflux disease)   . 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 Esophageal reflux 06/08/2014  . Hemorrhoids, internal, with bleeding 06/08/2014    Past Surgical History  Procedure Laterality Date  . Knee arthroscopy Left    Family History  Problem Relation Age of Onset  . Diabetes Father   . Colon cancer Father 39  . Heart disease Father   . Hyperlipidemia Father   . Heart disease Mother   . Hyperlipidemia Mother   . Heart disease Brother   . Heart disease Maternal Grandmother   . Heart disease Maternal Grandfather   . Mental illness Maternal Grandfather   . Heart disease Paternal Grandmother   . Heart disease Paternal Grandfather   . Colon cancer Paternal Grandfather    History    Substance Use Topics  . Smoking status: Never Smoker   . Smokeless tobacco: Never Used  . Alcohol Use: No   Current Outpatient Prescriptions  Medication Sig Dispense Refill  . loperamide (IMODIUM A-D) 2 MG tablet Take 2 mg by mouth as needed for diarrhea or loose stools.    . dicyclomine (BENTYL) 20 MG tablet Take 1 tablet (20 mg total) by mouth 2 (two) times daily. 60 tablet 3  . hydrocortisone (ANUSOL-HC) 25 MG suppository Place 1 suppository (25 mg total) rectally 2 (two) times daily. X 10 days 20 suppository 1  . saccharomyces boulardii (FLORASTOR) 250 MG capsule Take 1 capsule (250 mg total) by mouth 2 (two) times daily. 60 capsule 3   No current facility-administered medications for this visit.   No Known Allergies    Review of Systems: Per history of present illness otherwise negative   Endoscopies: See history of present illness   Physical Exam: General: Pleasant, well developed male in no acute distress Head: Normocephalic and atraumatic Eyes:  sclerae anicteric, conjunctiva pink  Ears: Normal auditory acuity Lungs: Clear throughout to auscultation Heart: Regular rate and rhythm Abdomen: Soft, non distended, non-tender. No masses, no hepatomegaly. Normal bowel sounds Rectal: Scant brown stool heme positive, internal hemorrhoids Musculoskeletal: Symmetrical with no gross deformities  Extremities: No edema  Neurological: Alert oriented x 4, grossly nonfocal Psychological:  Alert and cooperative. Normal mood and affect  Assessment and Recommendations: 34 year old male with a one-month history of diarrhea here for evaluation. A CBC with differential and TSH will be obtained along with an IgA and TTG as the patient states his stools look oily. A stool for C. difficile will be obtained with the GI pathogen panel along with a pancreatic fecal elastase. In the meantime he's been instructed to adhere to a low residue low-fat diet and to avoid dairy until his diarrhea  resolves. He will empirically be started on florastor 250 mg 2 capsules twice daily for 30 days. He will be given a trial of Bentyl 20 mg twice a day. He will use Anusol HC suppositories 1 per rectum twice a day 10 days for his hemorrhoids. Further recommendations will be made pending the findings of the above testing. He will follow-up in 3-4 weeks, sooner if needed     Matricia Begnaud, Vita Barley PA-C 04/03/2015,

## 2015-04-03 NOTE — Telephone Encounter (Signed)
Patient will come today to see Arta Bruce, PA

## 2015-04-03 NOTE — Patient Instructions (Signed)
We have sent  medications to your pharmacy for you to pick up at your convenience. Your physician has requested that you go to the basement for  lab work before leaving today. No dairy til diarrhea resolves. You have an appointment on 05/19/15 830 am with Dr Carlean Purl.    Fat and Cholesterol Control Diet Fat and cholesterol levels in your blood and organs are influenced by your diet. High levels of fat and cholesterol may lead to diseases of the heart, small and large blood vessels, gallbladder, liver, and pancreas. CONTROLLING FAT AND CHOLESTEROL WITH DIET Although exercise and lifestyle factors are important, your diet is key. That is because certain foods are known to raise cholesterol and others to lower it. The goal is to balance foods for their effect on cholesterol and more importantly, to replace saturated and trans fat with other types of fat, such as monounsaturated fat, polyunsaturated fat, and omega-3 fatty acids. On average, a person should consume no more than 15 to 17 g of saturated fat daily. Saturated and trans fats are considered "bad" fats, and they will raise LDL cholesterol. Saturated fats are primarily found in animal products such as meats, butter, and cream. However, that does not mean you need to give up all your favorite foods. Today, there are good tasting, low-fat, low-cholesterol substitutes for most of the things you like to eat. Choose low-fat or nonfat alternatives. Choose round or loin cuts of red meat. These types of cuts are lowest in fat and cholesterol. Chicken (without the skin), fish, veal, and ground [place] breast are great choices. Eliminate fatty meats, such as hot dogs and salami. Even shellfish have little or no saturated fat. Have a 3 oz (85 g) portion when you eat lean meat, poultry, or fish. Trans fats are also called "partially hydrogenated oils." They are oils that have been scientifically manipulated so that they are solid at room temperature resulting in a  longer shelf life and improved taste and texture of foods in which they are added. Trans fats are found in stick margarine, some tub margarines, cookies, crackers, and baked goods.  When baking and cooking, oils are a great substitute for butter. The monounsaturated oils are especially beneficial since it is believed they lower LDL and raise HDL. The oils you should avoid entirely are saturated tropical oils, such as coconut and palm.  Remember to eat a lot from food groups that are naturally free of saturated and trans fat, including fish, fruit, vegetables, beans, grains (barley, rice, couscous, bulgur wheat), and pasta (without cream sauces).  IDENTIFYING FOODS THAT LOWER FAT AND CHOLESTEROL  Soluble fiber may lower your cholesterol. This type of fiber is found in fruits such as apples, vegetables such as broccoli, potatoes, and carrots, legumes such as beans, peas, and lentils, and grains such as barley. Foods fortified with plant sterols (phytosterol) may also lower cholesterol. You should eat at least 2 g per day of these foods for a cholesterol lowering effect.  Read package labels to identify low-saturated fats, trans fat free, and low-fat foods at the supermarket. Select cheeses that have only 2 to 3 g saturated fat per ounce. Use a heart-healthy tub margarine that is free of trans fats or partially hydrogenated oil. When buying baked goods (cookies, crackers), avoid partially hydrogenated oils. Breads and muffins should be made from whole grains (whole-wheat or whole oat flour, instead of "flour" or "enriched flour"). Buy non-creamy canned soups with reduced salt and no added fats.  FOOD PREPARATION  TECHNIQUES  Never deep-fry. If you must fry, either stir-fry, which uses very little fat, or use non-stick cooking sprays. When possible, broil, bake, or roast meats, and steam vegetables. Instead of putting butter or margarine on vegetables, use lemon and herbs, applesauce, and cinnamon (for squash and  sweet potatoes). Use nonfat yogurt, salsa, and low-fat dressings for salads.  LOW-SATURATED FAT / LOW-FAT FOOD SUBSTITUTES Meats / Saturated Fat (g)  Avoid: Steak, marbled (3 oz/85 g) / 11 g  Choose: Steak, lean (3 oz/85 g) / 4 g  Avoid: Hamburger (3 oz/85 g) / 7 g  Choose: Hamburger, lean (3 oz/85 g) / 5 g  Avoid: Ham (3 oz/85 g) / 6 g  Choose: Ham, lean cut (3 oz/85 g) / 2.4 g  Avoid: Chicken, with skin, dark meat (3 oz/85 g) / 4 g  Choose: Chicken, skin removed, dark meat (3 oz/85 g) / 2 g  Avoid: Chicken, with skin, light meat (3 oz/85 g) / 2.5 g  Choose: Chicken, skin removed, light meat (3 oz/85 g) / 1 g Dairy / Saturated Fat (g)  Avoid: Whole milk (1 cup) / 5 g  Choose: Low-fat milk, 2% (1 cup) / 3 g  Choose: Low-fat milk, 1% (1 cup) / 1.5 g  Choose: Skim milk (1 cup) / 0.3 g  Avoid: Hard cheese (1 oz/28 g) / 6 g  Choose: Skim milk cheese (1 oz/28 g) / 2 to 3 g  Avoid: Cottage cheese, 4% fat (1 cup) / 6.5 g  Choose: Low-fat cottage cheese, 1% fat (1 cup) / 1.5 g  Avoid: Ice cream (1 cup) / 9 g  Choose: Sherbet (1 cup) / 2.5 g  Choose: Nonfat frozen yogurt (1 cup) / 0.3 g  Choose: Frozen fruit bar / trace  Avoid: Whipped cream (1 tbs) / 3.5 g  Choose: Nondairy whipped topping (1 tbs) / 1 g Condiments / Saturated Fat (g)  Avoid: Mayonnaise (1 tbs) / 2 g  Choose: Low-fat mayonnaise (1 tbs) / 1 g  Avoid: Butter (1 tbs) / 7 g  Choose: Extra light margarine (1 tbs) / 1 g  Avoid: Coconut oil (1 tbs) / 11.8 g  Choose: Olive oil (1 tbs) / 1.8 g  Choose: Corn oil (1 tbs) / 1.7 g  Choose: Safflower oil (1 tbs) / 1.2 g  Choose: Sunflower oil (1 tbs) / 1.4 g  Choose: Soybean oil (1 tbs) / 2.4 g  Choose: Canola oil (1 tbs) / 1 g Document Released: 10/07/2005 Document Revised: 02/01/2013 Document Reviewed: 01/05/2014 Exit[employer] Patient Information 2015 [place], [place]. This information is not intended to replace advice given to you by your health care  provider. Make sure you discuss any questions you have with your health care provider.

## 2015-04-04 ENCOUNTER — Other Ambulatory Visit: Payer: Self-pay | Admitting: *Deleted

## 2015-04-04 DIAGNOSIS — R195 Other fecal abnormalities: Secondary | ICD-10-CM

## 2015-04-04 LAB — TISSUE TRANSGLUTAMINASE, IGA: Tissue Transglutaminase Ab, IgA: 1 U/mL (ref <4)

## 2015-04-05 ENCOUNTER — Ambulatory Visit (INDEPENDENT_AMBULATORY_CARE_PROVIDER_SITE_OTHER): Payer: Managed Care, Other (non HMO) | Admitting: Physician Assistant

## 2015-04-05 ENCOUNTER — Encounter: Payer: Self-pay | Admitting: Physician Assistant

## 2015-04-05 VITALS — BP 122/70 | HR 82 | Temp 99.2°F | Resp 16 | Ht 65.0 in | Wt 213.6 lb

## 2015-04-05 DIAGNOSIS — R195 Other fecal abnormalities: Secondary | ICD-10-CM

## 2015-04-05 DIAGNOSIS — R7989 Other specified abnormal findings of blood chemistry: Secondary | ICD-10-CM | POA: Diagnosis not present

## 2015-04-05 LAB — COMPLETE METABOLIC PANEL WITH GFR
ALT: 19 U/L (ref 0–53)
AST: 25 U/L (ref 0–37)
Albumin: 4.2 g/dL (ref 3.5–5.2)
Alkaline Phosphatase: 62 U/L (ref 39–117)
BUN: 13 mg/dL (ref 6–23)
CO2: 25 mEq/L (ref 19–32)
Calcium: 9.4 mg/dL (ref 8.4–10.5)
Chloride: 106 mEq/L (ref 96–112)
Creat: 1 mg/dL (ref 0.50–1.35)
GFR, Est African American: >89 mL/min
GFR, Est Non African American: >89 mL/min
Glucose, Bld: 87 mg/dL (ref 70–99)
Potassium: 4 mEq/L (ref 3.5–5.3)
Sodium: 140 mEq/L (ref 135–145)
Total Bilirubin: 0.5 mg/dL (ref 0.2–1.2)
Total Protein: 7.6 g/dL (ref 6.0–8.3)

## 2015-04-05 LAB — T4, FREE: Free T4: 0.75 ng/dL — ABNORMAL LOW (ref 0.80–1.80)

## 2015-04-05 NOTE — Progress Notes (Signed)
John [name]  MRN: [id number] DOB: 25-Nov-1980  Subjective:  Pt presents to clinic because he had lab work at [facility] and he was told he had high TSH.  He has been seeing [facility] for persistent diarrhea over the past several month. Yesterday, he received the results of a TSH of 5.21 and was sent here for evaluation. TSH before that (from 11/15/13) was 3.324. Pt reports he has felt fatigued, but he has felt that way for several years, has a 22 year old daughter and a 85 week old son, and does not sleep well at night. He has been told that he may have sleep apnea and sent to get a sleep study, but he never went. Pt has no other complaints or symptoms. He denies changes to his hair and skin, weight gain, cold intolerance, constipation, and puffy face.  Pt also states that some labs were ordered for him by his GI doctor, and he was told to go get his blood drawn. He requested that if we draw blood today to evaluate his thyroid, that we perform those labs as well, so that he does not need to get his blood drawn again.   Patient Active Problem List   Diagnosis Date Noted  . Family history of colon cancer - father and paternal grandfather 06/09/2014  . Hemorrhoids, internal, with bleeding 06/08/2014  . Esophageal reflux 06/08/2014    Current Outpatient Prescriptions on File Prior to Visit  Medication Sig Dispense Refill  . hydrocortisone (ANUSOL-HC) 25 MG suppository Place 1 suppository (25 mg total) rectally 2 (two) times daily. X 10 days 20 suppository 1  . saccharomyces boulardii (FLORASTOR) 250 MG capsule Take 1 capsule (250 mg total) by mouth 2 (two) times daily. 60 capsule 3  . dicyclomine (BENTYL) 20 MG tablet Take 1 tablet (20 mg total) by mouth 2 (two) times daily. (Patient not taking: Reported on 04/05/2015) 60 tablet 3   No current facility-administered medications on file prior to visit.    No Known Allergies  Review of Systems  Constitutional: Positive for fatigue (explained - with  2 small children). Negative for fever and chills.  Endocrine: Negative for cold intolerance and heat intolerance.   Objective:  BP 122/70 mmHg  Pulse 82  Temp(Src) 99.2 F (37.3 C) (Oral)  Resp 16  Ht 5' 5"  (1.651 m)  Wt 213 lb 9.6 oz (96.888 kg)  BMI 35.54 kg/m2  SpO2 97%  Physical Exam  Constitutional: He is oriented to person, place, and time and well-developed, well-nourished, and in no distress.  HENT:  Head: Normocephalic and atraumatic.  Right Ear: External ear normal.  Left Ear: External ear normal.  Eyes: Conjunctivae are normal.  Neck: Normal range of motion. No thyroid mass and no thyromegaly present.  Cardiovascular: Normal rate, regular rhythm and normal heart sounds.   No murmur heard. Pulmonary/Chest: Effort normal and breath sounds normal.  Neurological: He is alert and oriented to person, place, and time. Gait normal.  Skin: Skin is warm and dry.  Psychiatric: Mood, memory, affect and judgment normal.    Assessment and Plan :  High serum thyroid stimulating hormone (TSH) - Plan: COMPLETE METABOLIC PANEL WITH GFR, T4, Free  Heme + stool - Plan: IBC panel, Ferritin, Folate, Vitamin B12  At his f/u appt we will discussed the fatigue further and administer a Epworth Sleep questionnaire.  We will wait for labs to determine if treatment is indicated.   Windell Hummingbird PA-C  [facility] 04/05/2015 3:29 PM

## 2015-04-05 NOTE — Progress Notes (Signed)
Patient ID: John [name], male    DOB: June 02, 1981, 34 y.o.   MRN: [id number]  PCP: John Ray DAVID, MD   Subjective:  HPI Pt presents to clinic for evaluation of a high TSH lab result.  He has been seeing [facility] for persistent diarrhea over the past several month. Yesterday, he received the results of a TSH of 5.21 and was sent here for evaluation. TSH before that (from 11/15/13) was 3.324. Pt reports he has felt fatigued, but he has felt that way for several years, has a 36 year old daughter and a 68 week old son, and does not sleep well at night. He has been told that he may have sleep apnea and sent to get a sleep study, but he never went. Pt has no other complaints or symptoms. He denies changes to his hair and skin, weight gain, cold intolerance, constipation, and puffy face.  Pt also states that some labs were ordered for him by his GI doctor, and he was told to go get his blood drawn. He requested that if we draw blood today to evaluate his thyroid, that we perform those labs as well, so that he does not need to get his blood drawn again.   Review of Systems  Constitutional: Positive for fatigue. Negative for fever, chills, diaphoresis, activity change, appetite change and unexpected weight change.  HENT: Negative.   Eyes: Negative.   Respiratory: Negative.   Cardiovascular: Negative.   Gastrointestinal: Positive for diarrhea, blood in stool and anal bleeding (being followed with GI). Negative for nausea, vomiting, abdominal pain, constipation, abdominal distention and rectal pain.  Endocrine: Negative.   Musculoskeletal: Negative for myalgias and arthralgias.  Skin: Negative.   Neurological: Negative.   Hematological: Negative.      Patient Active Problem List   Diagnosis Date Noted  . Family history of colon cancer - father and paternal grandfather 06/09/2014  . Hemorrhoids, internal, with bleeding 06/08/2014  . Esophageal reflux 06/08/2014    Past Medical  History  Diagnosis Date  . GERD (gastroesophageal reflux disease)   . 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 Esophageal reflux 06/08/2014  . Hemorrhoids, internal, with bleeding 06/08/2014    Prior to Admission medications   Medication Sig Start Date End Date Taking? Authorizing Provider  hydrocortisone (ANUSOL-HC) 25 MG suppository Place 1 suppository (25 mg total) rectally 2 (two) times daily. X 10 days 04/03/15  Yes Lori P Hvozdovic, PA-C  saccharomyces boulardii (FLORASTOR) 250 MG capsule Take 1 capsule (250 mg total) by mouth 2 (two) times daily. 04/03/15  Yes Lori P Hvozdovic, PA-C  dicyclomine (BENTYL) 20 MG tablet Take 1 tablet (20 mg total) by mouth 2 (two) times daily. Patient not taking: Reported on 04/05/2015 04/03/15   Cecille Rubin P Hvozdovic, PA-C    No Known Allergies  Past Medical, Surgical Family and Social History reviewed and updated.   Objective:   Vitals: BP 122/70 mmHg  Pulse 82  Temp(Src) 99.2 F (37.3 C) (Oral)  Resp 16  Ht 5\' 5"  (1.651 m)  Wt 213 lb 9.6 oz (96.888 kg)  BMI 35.54 kg/m2  SpO2 97%   Physical Exam  Constitutional: He is oriented to person, place, and time. He appears well-developed and well-nourished. No distress.  HENT:  Head: Normocephalic and atraumatic.  Right Ear: External ear normal.  Left Ear: External ear normal.  Eyes: Conjunctivae and EOM are normal. Pupils are equal, round, and reactive to light.  Neck: Normal  range of motion. Neck supple. No thyromegaly present.  Cardiovascular: Normal rate, regular rhythm, normal heart sounds and intact distal pulses.  Exam reveals no gallop and no friction rub.   No murmur heard. Pulmonary/Chest: Effort normal and breath sounds normal. No respiratory distress. He has no wheezes. He has no rales.  Abdominal: Soft. Bowel sounds are normal. He exhibits no distension. There is no tenderness. There is no rebound and no guarding.  Lymphadenopathy:       Head (right side): No submental, no submandibular, no  tonsillar, no preauricular, no posterior auricular and no occipital adenopathy present.       Head (left side): No submental, no submandibular, no tonsillar, no preauricular, no posterior auricular and no occipital adenopathy present.    He has no cervical adenopathy.  Neurological: He is alert and oriented to person, place, and time. He has normal reflexes.  Skin: Skin is warm, dry and intact.  Psychiatric: He has a normal mood and affect. His speech is normal and behavior is normal.    Assessment & Plan:   John was seen today for an elevated TSH.  Diagnoses and all orders for this visit:  High serum thyroid stimulating hormone (TSH) -    Pt's only symptoms of hypothyroidism is fatigue, which could be due to other causes such as having young children or not sleeping well due to sleep apnea. Plan is to do further testing and evaluated free T4 to determine if he needs treatment or not. -     Await lab results. -     Follow up via MyChart with lab results and can send in a prescription of levothyroxine if needed. -     Follow up in clinic in 8 weeks. Orders: -     COMPLETE METABOLIC PANEL WITH GFR -     T4, Free  Heme + stool -    Pt should continue to follow-up with [facility], follow specialist plan. -     Will send lab results to [place]. Orders: -     IBC panel -     Ferritin -     Folate -     Vitamin B12     Susan Arana, PA-S [facility] 04/05/2015 4:51 PM

## 2015-04-06 ENCOUNTER — Other Ambulatory Visit: Payer: Managed Care, Other (non HMO)

## 2015-04-06 DIAGNOSIS — R195 Other fecal abnormalities: Secondary | ICD-10-CM

## 2015-04-06 DIAGNOSIS — K649 Unspecified hemorrhoids: Secondary | ICD-10-CM

## 2015-04-06 DIAGNOSIS — R197 Diarrhea, unspecified: Secondary | ICD-10-CM

## 2015-04-07 ENCOUNTER — Other Ambulatory Visit: Payer: Self-pay | Admitting: *Deleted

## 2015-04-07 LAB — CLOSTRIDIUM DIFFICILE BY PCR: Toxigenic C. Difficile by PCR: DETECTED — CR

## 2015-04-07 MED ORDER — METRONIDAZOLE 500 MG PO TABS: ORAL_TABLET | ORAL | Status: DC

## 2015-04-12 ENCOUNTER — Other Ambulatory Visit: Payer: Self-pay | Admitting: Physician Assistant

## 2015-04-12 DIAGNOSIS — E038 Other specified hypothyroidism: Secondary | ICD-10-CM

## 2015-04-12 LAB — PANCREATIC ELASTASE, FECAL: Pancreatic Elastase-1, Stool: >500 mcg/g

## 2015-04-12 MED ORDER — LEVOTHROID 25 MCG PO TABS: 25.0000 ug | ORAL_TABLET | Freq: Every day | ORAL | Status: DC

## 2015-04-13 ENCOUNTER — Other Ambulatory Visit: Payer: Self-pay

## 2015-04-13 MED ORDER — METRONIDAZOLE 500 MG PO TABS: 500.0000 mg | ORAL_TABLET | Freq: Three times a day (TID) | ORAL | Status: DC

## 2015-04-21 ENCOUNTER — Telehealth: Payer: Self-pay | Admitting: Internal Medicine

## 2015-04-21 ENCOUNTER — Telehealth: Payer: Self-pay | Admitting: *Deleted

## 2015-04-21 ENCOUNTER — Other Ambulatory Visit: Payer: Self-pay | Admitting: *Deleted

## 2015-04-21 MED ORDER — VANCOMYCIN HCL 125 MG PO CAPS: 125.0000 mg | ORAL_CAPSULE | Freq: Four times a day (QID) | ORAL | Status: DC

## 2015-04-21 MED ORDER — DIPHENOXYLATE-ATROPINE 2.5-0.025 MG PO TABS: 1.0000 | ORAL_TABLET | Freq: Four times a day (QID) | ORAL | Status: DC | PRN

## 2015-04-21 NOTE — Telephone Encounter (Signed)
Called rx for Vancomycin to [facility] as per patient request.

## 2015-04-21 NOTE — Telephone Encounter (Signed)
Spoke with patient's wife and she states he is still having diarrhea after taking Flagyl. They are going out of town today and would like Rx ASAP.

## 2015-04-25 NOTE — Telephone Encounter (Signed)
Prescription sent to Rx

## 2015-05-09 ENCOUNTER — Telehealth: Payer: Self-pay | Admitting: *Deleted

## 2015-05-09 LAB — GASTROINTESTINAL PATHOGEN PANEL PCR
C. difficile Tox A/B, PCR: POSITIVE
Campylobacter, PCR: NOT DETECTED
Cryptosporidium, PCR: NOT DETECTED
E coli (ETEC) LT/ST PCR: NOT DETECTED
E coli (STEC) stx1/stx2, PCR: NOT DETECTED
E coli 0157, PCR: NOT DETECTED
Giardia lamblia, PCR: NOT DETECTED
Norovirus, PCR: NOT DETECTED
Rotavirus A, PCR: NOT DETECTED
Salmonella, PCR: NOT DETECTED
Shigella, PCR: NOT DETECTED

## 2015-05-09 NOTE — Telephone Encounter (Signed)
Great. Thank you very much for checking with him. Sore report for the confusion. He will not need to do the taper at this time.

## 2015-05-09 NOTE — Telephone Encounter (Signed)
Spoke with patient and he states he is doing good. Solid stools for several days now. He did complete the Vancomycin. No new specimen.

## 2015-05-10 ENCOUNTER — Other Ambulatory Visit: Payer: Self-pay | Admitting: Internal Medicine

## 2015-05-10 ENCOUNTER — Telehealth: Payer: Self-pay | Admitting: Internal Medicine

## 2015-05-10 NOTE — Telephone Encounter (Signed)
No diarrhea Has completed vancomycin Advised he did not have to keep f/u appt if all ok

## 2015-05-19 ENCOUNTER — Ambulatory Visit: Payer: Managed Care, Other (non HMO) | Admitting: Internal Medicine

## 2015-06-27 ENCOUNTER — Other Ambulatory Visit: Payer: Self-pay | Admitting: Internal Medicine

## 2015-06-27 MED ORDER — DIPHENOXYLATE-ATROPINE 2.5-0.025 MG PO TABS: 1.0000 | ORAL_TABLET | Freq: Four times a day (QID) | ORAL | Status: DC | PRN

## 2015-06-27 NOTE — Telephone Encounter (Signed)
Is he having diarrhea again? If so may refill but needs another C diff PCR

## 2015-06-27 NOTE — Addendum Note (Signed)
Addended by: [place], Zayquan Bogard E on: 06/27/2015 01:41 PM   Modules accepted: Orders

## 2015-06-27 NOTE — Telephone Encounter (Signed)
Patient is having intermittent diarrhea with formed soft stools in between. 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 If diarrhea persists, he will need C Diff. For now, refill Lomotil.

## 2015-06-27 NOTE — Telephone Encounter (Signed)
May I refill Sir? 

## 2015-06-27 NOTE — Telephone Encounter (Signed)
Sent to [facility]

## 2015-06-29 ENCOUNTER — Encounter: Payer: Self-pay | Admitting: Physician Assistant

## 2015-06-29 ENCOUNTER — Ambulatory Visit (INDEPENDENT_AMBULATORY_CARE_PROVIDER_SITE_OTHER): Payer: Managed Care, Other (non HMO) | Admitting: Physician Assistant

## 2015-06-29 VITALS — BP 124/76 | HR 76 | Temp 99.0°F | Resp 16 | Ht 64.0 in | Wt 215.4 lb

## 2015-06-29 DIAGNOSIS — E039 Hypothyroidism, unspecified: Secondary | ICD-10-CM

## 2015-06-29 NOTE — Progress Notes (Signed)
   John [name]  MRN: [id number] DOB: 1981/05/23  Subjective:  Pt presents to clinic for recheck of his hypothyroidism.  He took the medication for a month but then he stopped it because he did not understand that he had to keep taking it.  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  Patient Active Problem List   Diagnosis Date Noted  . Hypothyroidism 06/29/2015  . Family history of colon cancer - father and paternal grandfather 06/09/2014  . Hemorrhoids, internal, with bleeding 06/08/2014  . Esophageal reflux 06/08/2014    Current Outpatient Prescriptions on File Prior to Visit  Medication Sig Dispense Refill  . LEVOTHROID 25 MCG tablet Take 1 tablet (25 mcg total) by mouth daily before breakfast. 30 tablet 2   No current facility-administered medications on file prior to visit.    Allergies  Allergen Reactions  . Ibuprofen     Lips swelling, rash, legs hurting    Review of Systems Objective:  BP 124/76 mmHg  Pulse 76  Temp(Src) 99 F (37.2 C) (Oral)  Resp 16  Ht 5\' 4"  (1.626 m)  Wt 215 lb 6.4 oz (97.705 kg)  BMI 36.96 kg/m2  SpO2 97%  Physical Exam  Constitutional: He is oriented to person, place, and time and well-developed, well-nourished, and in no distress.  HENT:  Head: Normocephalic and atraumatic.  Right Ear: External ear normal.  Left Ear: External ear normal.  Eyes: Conjunctivae are normal.  Neck: Normal range of motion.  Pulmonary/Chest: Effort normal.  Neurological: He is alert and oriented to person, place, and time. Gait normal.  Skin: Skin is warm and dry.  Psychiatric: Mood, memory, affect and judgment normal.    Assessment and Plan :  Hypothyroidism, unspecified hypothyroidism type - Plan: TSH, T4, Free, CANCELED: TSH, CANCELED: T4, Free   Pt will RTC in 8 weeks for lab only visit and then recheck with me in 4 months.  Windell Hummingbird PA-C  [facility] 06/29/2015 4:50 PM

## 2015-06-29 NOTE — Patient Instructions (Signed)
Restart thyroid medications  In 2 months return to clinic for a lab draw only  In 4 months recheck with me

## 2015-07-04 ENCOUNTER — Other Ambulatory Visit: Payer: Self-pay

## 2015-07-04 DIAGNOSIS — R197 Diarrhea, unspecified: Secondary | ICD-10-CM

## 2015-07-06 ENCOUNTER — Other Ambulatory Visit: Payer: Managed Care, Other (non HMO)

## 2015-07-06 DIAGNOSIS — R197 Diarrhea, unspecified: Secondary | ICD-10-CM

## 2015-07-07 LAB — CLOSTRIDIUM DIFFICILE BY PCR: Toxigenic C. Difficile by PCR: NOT DETECTED

## 2015-07-07 NOTE — Progress Notes (Signed)
Quick Note:  C diff negative  If he is not using a probiotic he should start one My dx is post-infectious IBS  Will f/u Monday re: sxs and further Rx ______

## 2015-07-08 ENCOUNTER — Ambulatory Visit (INDEPENDENT_AMBULATORY_CARE_PROVIDER_SITE_OTHER): Payer: Managed Care, Other (non HMO) | Admitting: Family Medicine

## 2015-07-08 VITALS — BP 118/60 | HR 98 | Temp 100.0°F | Resp 18 | Ht 66.0 in | Wt 212.0 lb

## 2015-07-08 DIAGNOSIS — Z8719 Personal history of other diseases of the digestive system: Secondary | ICD-10-CM | POA: Diagnosis not present

## 2015-07-08 DIAGNOSIS — R197 Diarrhea, unspecified: Secondary | ICD-10-CM

## 2015-07-08 DIAGNOSIS — R1032 Left lower quadrant pain: Secondary | ICD-10-CM

## 2015-07-08 DIAGNOSIS — A09 Infectious gastroenteritis and colitis, unspecified: Secondary | ICD-10-CM | POA: Diagnosis not present

## 2015-07-08 DIAGNOSIS — Z8619 Personal history of other infectious and parasitic diseases: Secondary | ICD-10-CM

## 2015-07-08 LAB — POCT CBC
Granulocyte percent: 71.2 %G (ref 37–80)
HCT, POC: 35.6 % — AB (ref 43.5–53.7)
Hemoglobin: 11.1 g/dL — AB (ref 14.1–18.1)
Lymph, poc: 2.2 (ref 0.6–3.4)
MCH, POC: 23.8 pg — AB (ref 27–31.2)
MCHC: 31.2 g/dL — AB (ref 31.8–35.4)
MCV: 76.3 fL — AB (ref 80–97)
MID (cbc): 1.5 — AB (ref 0–0.9)
MPV: 6.2 fL (ref 0–99.8)
POC Granulocyte: 9.2 — AB (ref 2–6.9)
POC LYMPH PERCENT: 17.2 %L (ref 10–50)
POC MID %: 11.6 %M (ref 0–12)
Platelet Count, POC: 464 10*3/uL — AB (ref 142–424)
RBC: 4.66 M/uL — AB (ref 4.69–6.13)
RDW, POC: 14.7 %
WBC: 12.9 10*3/uL — AB (ref 4.6–10.2)

## 2015-07-08 LAB — POCT SEDIMENTATION RATE: POCT SED RATE: 69 mm/hr — AB (ref 0–22)

## 2015-07-08 MED ORDER — METRONIDAZOLE 500 MG PO TABS: 500.0000 mg | ORAL_TABLET | Freq: Three times a day (TID) | ORAL | Status: DC

## 2015-07-08 MED ORDER — HYDROCODONE-ACETAMINOPHEN 5-325 MG PO TABS: 1.0000 | ORAL_TABLET | ORAL | Status: DC | PRN

## 2015-07-08 NOTE — Patient Instructions (Addendum)
Take the metronidazole one pill 3 times daily. No alcohol on this medication.  Take the hydrocodone pain pills only if needed for severe pain, one every 4-6 hours.  In the event of acute worsening of pain or generalized illness or increased bleeding go to the emergency room  Clostridium Difficile Infection Clostridium difficile (C. difficile) is a bacteria found in the intestinal tract or colon. Under certain conditions, it causes diarrhea and sometimes severe disease. The severe form of the disease is known as pseudomembranous colitis (often called C. difficile colitis). This disease can damage the lining of the colon or cause the colon to become enlarged (toxic megacolon). CAUSES Your colon normally contains many different bacteria, including C. difficile. The balance of bacteria in your colon can change during illness. This is especially true when you take antibiotic medicine. Taking antibiotics may allow the C. difficile to grow, multiply excessively, and make a toxin that then causes illness. The elderly and people with certain medical conditions have a greater risk of getting C. difficile infections. SYMPTOMS  Watery diarrhea.  Fever.  Fatigue.  Loss of appetite.  Nausea.  Abdominal swelling, pain, or tenderness.  Dehydration. DIAGNOSIS Your symptoms may make your caregiver suspect a C. difficile infection, especially if you have used antibiotics in the preceding weeks. However, there are only 2 ways to know for certain whether you have a C. difficile infection:  A lab test that finds the toxin in your stool.  The specific appearance of an abnormality (pseudomembrane) in your colon. This can only be seen by doing a sigmoidoscopy or colonoscopy. These procedures involve passing an instrument through your rectum to look at the inside of your colon. Your caregiver will help determine if these tests are necessary. TREATMENT  Most people are successfully treated with one of two  specific antibiotics, usually given by mouth. Other antibiotics you are receiving are stopped if possible.  Intravenous (IV) fluids and correction of electrolyte imbalance may be necessary.  Rarely, surgery may be needed to remove the infected part of the intestines.  Careful hand washing by you and your caregivers is important to prevent the spread of infection. In the hospital, your caregivers may also put on gowns and gloves to prevent the spread of the C. difficile bacteria. Your room is also cleaned regularly with a solution containing bleach or a product that is known to kill C. difficile. HOME CARE INSTRUCTIONS  Drink enough fluids to keep your urine clear or pale yellow. Avoid milk, caffeine, and alcohol.  Ask your caregiver for specific rehydration instructions.  Try eating small, frequent meals rather than large meals.  Take your antibiotics as directed. Finish them even if you start to feel better.  Do not use medicines to slow diarrhea. This could delay healing or cause complications.  Wash your hands thoroughly after using the bathroom and before preparing food.  Make sure people who live with you wash their hands often, too.  Carefully disinfect all surfaces with a product that contains chlorine bleach. SEEK MEDICAL CARE IF:  Diarrhea persists longer than expected or recurs after completing your course of antibiotic treatment for the C. difficile infection.  You have trouble staying hydrated. SEEK IMMEDIATE MEDICAL CARE IF:  You develop a new fever.  You have increasing abdominal pain or tenderness.  There is blood in your stools, or your stools are dark black and tarry.  You cannot hold down food or liquids. MAKE SURE YOU:  Understand these instructions.  Will watch your  condition.  Will get help right away if you are not doing well or get worse. Document Released: 07/17/2005 Document Revised: 02/21/2014 Document Reviewed: 03/15/2011 [facility] Patient  Information 2015 [place], [place]. This information is not intended to replace advice given to you by your health care provider. Make sure you discuss any questions you have with your health care provider.

## 2015-07-08 NOTE — Progress Notes (Signed)
Patient ID: John [name], male    DOB: May 07, 1981  Age: 34 y.o. MRN: [id number]  Chief Complaint  Patient presents with  . Abdominal Pain    x's 2 days with blood in the stool, diarrhea and fever  . Diarrhea  . Fever    Subjective:   Patient has a history of Clostridium difficile earlier this year. They ended up ultimately having to treat him with vancomycin. He has a gastroenterologist. He did have a colonoscopy not long ago which was clean, no diverticula or polyps. His wife works for Dr. Carlean Purl, the gastroenterologist. Apparently they talked earlier and a few days ago check today Clostridium difficile which was negative. He has been passing blood and diarrhea and having left lower quadrant pain. This been going on for several days. No rectal pain.  Current allergies, medications, problem list, past/family and social histories reviewed.  Objective:  BP 118/60 mmHg  Pulse 98  Temp(Src) 100 F (37.8 C) (Oral)  Resp 18  Ht 5' 6"  (1.676 m)  Wt 212 lb (96.163 kg)  BMI 34.23 kg/m2  SpO2 96%  Chest clear. Heart regular. Abdomen has normal bowel sounds. Soft without organomegaly or masses. Very tender right along the sigmoid colon region. Anus appears normal with no lesions.  We ordered labs and a stool test. He didn't collect a stool specimen and it is a visibly bloody diarrhea.    Assessment & Plan:   Assessment: 1. Bloody diarrhea   2. History of Clostridium difficile colitis   3. LLQ abdominal pain       Plan: Orders Placed This Encounter  Procedures  . Clostridium Difficile by PCR    History of C. difficile earlier this year. This makes him high enough risk that I wish to repeat it even though he has had one already recently.    Order Specific Question:  Is your patient experiencing loose or watery stools (3 or more in 24 hours)?    Answer:  Yes    Order Specific Question:  Has the patient received laxatives in the last 24 hours?    Answer:  No    Order Specific  Question:  Has a negative Cdiff test resulted in the last 7 days?    Answer:  Yes (testing not recommeded)  . POCT CBC  . POCT SEDIMENTATION RATE    Meds ordered this encounter  Medications  . naproxen sodium (ANAPROX) 220 MG tablet    Sig: Take 220 mg by mouth 2 (two) times daily with a meal.  . metroNIDAZOLE (FLAGYL) 500 MG tablet    Sig: Take 1 tablet (500 mg total) by mouth 3 (three) times daily. DO NOT CONSUME ALCOHOL WHILE TAKING THIS MEDICATION.    Dispense:  30 tablet    Refill:  0  . HYDROcodone-acetaminophen (NORCO) 5-325 MG per tablet    Sig: Take 1 tablet by mouth every 4 (four) hours as needed.    Dispense:  12 tablet    Refill:  0     Results for orders placed or performed in visit on 07/08/15  POCT CBC  Result Value Ref Range   WBC 12.9 (A) 4.6 - 10.2 K/uL   Lymph, poc 2.2 0.6 - 3.4   POC LYMPH PERCENT 17.2 10 - 50 %L   MID (cbc) 1.5 (A) 0 - 0.9   POC MID % 11.6 0 - 12 %M   POC Granulocyte 9.2 (A) 2 - 6.9   Granulocyte percent 71.2 37 - 80 %G  RBC 4.66 (A) 4.69 - 6.13 M/uL   Hemoglobin 11.1 (A) 14.1 - 18.1 g/dL   HCT, POC 35.6 (A) 43.5 - 53.7 %   MCV 76.3 (A) 80 - 97 fL   MCH, POC 23.8 (A) 27 - 31.2 pg   MCHC 31.2 (A) 31.8 - 35.4 g/dL   RDW, POC 14.7 %   Platelet Count, POC 464 (A) 142 - 424 K/uL   MPV 6.2 0 - 99.8 fL       Patient Instructions  Take the metronidazole one pill 3 times daily. No alcohol on this medication.  Take the hydrocodone pain pills only if needed for severe pain, one every 4-6 hours.  In the event of acute worsening of pain or generalized illness or increased bleeding go to the emergency room  Clostridium Difficile Infection Clostridium difficile (C. difficile) is a bacteria found in the intestinal tract or colon. Under certain conditions, it causes diarrhea and sometimes severe disease. The severe form of the disease is known as pseudomembranous colitis (often called C. difficile colitis). This disease can damage the lining  of the colon or cause the colon to become enlarged (toxic megacolon). CAUSES Your colon normally contains many different bacteria, including C. difficile. The balance of bacteria in your colon can change during illness. This is especially true when you take antibiotic medicine. Taking antibiotics may allow the C. difficile to grow, multiply excessively, and make a toxin that then causes illness. The elderly and people with certain medical conditions have a greater risk of getting C. difficile infections. SYMPTOMS  Watery diarrhea.  Fever.  Fatigue.  Loss of appetite.  Nausea.  Abdominal swelling, pain, or tenderness.  Dehydration. DIAGNOSIS Your symptoms may make your caregiver suspect a C. difficile infection, especially if you have used antibiotics in the preceding weeks. However, there are only 2 ways to know for certain whether you have a C. difficile infection:  A lab test that finds the toxin in your stool.  The specific appearance of an abnormality (pseudomembrane) in your colon. This can only be seen by doing a sigmoidoscopy or colonoscopy. These procedures involve passing an instrument through your rectum to look at the inside of your colon. Your caregiver will help determine if these tests are necessary. TREATMENT  Most people are successfully treated with one of two specific antibiotics, usually given by mouth. Other antibiotics you are receiving are stopped if possible.  Intravenous (IV) fluids and correction of electrolyte imbalance may be necessary.  Rarely, surgery may be needed to remove the infected part of the intestines.  Careful hand washing by you and your caregivers is important to prevent the spread of infection. In the hospital, your caregivers may also put on gowns and gloves to prevent the spread of the C. difficile bacteria. Your room is also cleaned regularly with a solution containing bleach or a product that is known to kill C. difficile. HOME CARE  INSTRUCTIONS  Drink enough fluids to keep your urine clear or pale yellow. Avoid milk, caffeine, and alcohol.  Ask your caregiver for specific rehydration instructions.  Try eating small, frequent meals rather than large meals.  Take your antibiotics as directed. Finish them even if you start to feel better.  Do not use medicines to slow diarrhea. This could delay healing or cause complications.  Wash your hands thoroughly after using the bathroom and before preparing food.  Make sure people who live with you wash their hands often, too.  Carefully disinfect all surfaces with  a product that contains chlorine bleach. SEEK MEDICAL CARE IF:  Diarrhea persists longer than expected or recurs after completing your course of antibiotic treatment for the C. difficile infection.  You have trouble staying hydrated. SEEK IMMEDIATE MEDICAL CARE IF:  You develop a new fever.  You have increasing abdominal pain or tenderness.  There is blood in your stools, or your stools are dark black and tarry.  You cannot hold down food or liquids. MAKE SURE YOU:  Understand these instructions.  Will watch your condition.  Will get help right away if you are not doing well or get worse. Document Released: 07/17/2005 Document Revised: 02/21/2014 Document Reviewed: 03/15/2011 [facility] Patient Information 2015 [place], [place]. This information is not intended to replace advice given to you by your health care Idania Desouza. Make sure you discuss any questions you have with your health care Briella Hobday.      No Follow-up on file.   HOPPER,DAVID, MD 07/08/2015

## 2015-07-10 ENCOUNTER — Telehealth: Payer: Self-pay | Admitting: Internal Medicine

## 2015-07-10 ENCOUNTER — Telehealth: Payer: Self-pay

## 2015-07-10 DIAGNOSIS — R197 Diarrhea, unspecified: Secondary | ICD-10-CM

## 2015-07-10 LAB — CLOSTRIDIUM DIFFICILE BY PCR: Toxigenic C. Difficile by PCR: NOT DETECTED

## 2015-07-10 NOTE — Telephone Encounter (Signed)
Letter printed and faxed

## 2015-07-10 NOTE — Telephone Encounter (Signed)
Pt states he would like to have an oow note faxed to [phone] attn: Dottie and you may reach pt at [phone] if needed The note need to be for Today and Tomorrow

## 2015-07-10 NOTE — Telephone Encounter (Signed)
Instructions given to patient's wife.  Referral sent

## 2015-07-10 NOTE — Telephone Encounter (Signed)
Persistent diarrhea, bleeding and had fever. C diff PCR neg To [facility] 2 d ago retested for C diff - pending, wbc 12 Started metronidazole  Fever gone, stil w/ LLQ pain and bloody diarrhea   Have recommended unprepped flex sig Wed 9/20 0730 If C diff returns + will probably switch to vancomycin and cancel but do not want to lose time in w/u if needed

## 2015-07-11 ENCOUNTER — Encounter (HOSPITAL_COMMUNITY): Payer: Self-pay

## 2015-07-11 ENCOUNTER — Encounter (HOSPITAL_COMMUNITY)
Admission: EM | Disposition: A | Discharge status: Home / Self Care | Payer: Self-pay | Source: Home / Self Care | Attending: Internal Medicine

## 2015-07-11 ENCOUNTER — Inpatient Hospital Stay (HOSPITAL_COMMUNITY)
Admission: EM | Admit: 2015-07-11 | Discharge: 2015-07-12 | DRG: 378 | Disposition: A | Discharge status: Home / Self Care | Payer: Managed Care, Other (non HMO) | Attending: Internal Medicine | Admitting: Internal Medicine

## 2015-07-11 DIAGNOSIS — K529 Noninfective gastroenteritis and colitis, unspecified: Secondary | ICD-10-CM | POA: Diagnosis present

## 2015-07-11 DIAGNOSIS — E876 Hypokalemia: Secondary | ICD-10-CM | POA: Diagnosis present

## 2015-07-11 DIAGNOSIS — Z8249 Family history of ischemic heart disease and other diseases of the circulatory system: Secondary | ICD-10-CM

## 2015-07-11 DIAGNOSIS — A09 Infectious gastroenteritis and colitis, unspecified: Secondary | ICD-10-CM | POA: Diagnosis not present

## 2015-07-11 DIAGNOSIS — K2900 Acute gastritis without bleeding: Secondary | ICD-10-CM | POA: Diagnosis present

## 2015-07-11 DIAGNOSIS — T380X5A Adverse effect of glucocorticoids and synthetic analogues, initial encounter: Secondary | ICD-10-CM | POA: Diagnosis present

## 2015-07-11 DIAGNOSIS — D75839 Thrombocytosis, unspecified: Secondary | ICD-10-CM

## 2015-07-11 DIAGNOSIS — D473 Essential (hemorrhagic) thrombocythemia: Secondary | ICD-10-CM | POA: Diagnosis present

## 2015-07-11 DIAGNOSIS — D72829 Elevated white blood cell count, unspecified: Secondary | ICD-10-CM | POA: Diagnosis present

## 2015-07-11 DIAGNOSIS — K922 Gastrointestinal hemorrhage, unspecified: Secondary | ICD-10-CM

## 2015-07-11 DIAGNOSIS — D62 Acute posthemorrhagic anemia: Secondary | ICD-10-CM | POA: Diagnosis present

## 2015-07-11 DIAGNOSIS — K219 Gastro-esophageal reflux disease without esophagitis: Secondary | ICD-10-CM | POA: Diagnosis present

## 2015-07-11 DIAGNOSIS — Z833 Family history of diabetes mellitus: Secondary | ICD-10-CM | POA: Diagnosis not present

## 2015-07-11 DIAGNOSIS — K449 Diaphragmatic hernia without obstruction or gangrene: Secondary | ICD-10-CM | POA: Diagnosis present

## 2015-07-11 DIAGNOSIS — E871 Hypo-osmolality and hyponatremia: Secondary | ICD-10-CM | POA: Diagnosis present

## 2015-07-11 DIAGNOSIS — Z800 Family history of malignant neoplasm of digestive organs: Secondary | ICD-10-CM | POA: Diagnosis not present

## 2015-07-11 DIAGNOSIS — K625 Hemorrhage of anus and rectum: Secondary | ICD-10-CM | POA: Diagnosis present

## 2015-07-11 DIAGNOSIS — R112 Nausea with vomiting, unspecified: Secondary | ICD-10-CM | POA: Diagnosis present

## 2015-07-11 DIAGNOSIS — E039 Hypothyroidism, unspecified: Secondary | ICD-10-CM | POA: Diagnosis present

## 2015-07-11 DIAGNOSIS — K210 Gastro-esophageal reflux disease with esophagitis: Secondary | ICD-10-CM | POA: Diagnosis present

## 2015-07-11 DIAGNOSIS — R197 Diarrhea, unspecified: Secondary | ICD-10-CM | POA: Diagnosis present

## 2015-07-11 DIAGNOSIS — K5190 Ulcerative colitis, unspecified, without complications: Secondary | ICD-10-CM | POA: Diagnosis present

## 2015-07-11 HISTORY — PX: ESOPHAGOGASTRODUODENOSCOPY: SHX5428

## 2015-07-11 HISTORY — PX: FLEXIBLE SIGMOIDOSCOPY: SHX5431

## 2015-07-11 LAB — CBC
HCT: 33 % — ABNORMAL LOW (ref 39.0–52.0)
Hemoglobin: 10.9 g/dL — ABNORMAL LOW (ref 13.0–17.0)
MCH: 25.5 pg — ABNORMAL LOW (ref 26.0–34.0)
MCHC: 33 g/dL (ref 30.0–36.0)
MCV: 77.1 fL — ABNORMAL LOW (ref 78.0–100.0)
Platelets: 433 10*3/uL — ABNORMAL HIGH (ref 150–400)
RBC: 4.28 MIL/uL (ref 4.22–5.81)
RDW: 13.9 % (ref 11.5–15.5)
WBC: 17.3 10*3/uL — ABNORMAL HIGH (ref 4.0–10.5)

## 2015-07-11 LAB — CBC WITH DIFFERENTIAL/PLATELET
Basophils Absolute: 0 10*3/uL (ref 0.0–0.1)
Basophils Relative: 0 %
Eosinophils Absolute: 0.2 10*3/uL (ref 0.0–0.7)
Eosinophils Relative: 1 %
HCT: 32.6 % — ABNORMAL LOW (ref 39.0–52.0)
Hemoglobin: 10.7 g/dL — ABNORMAL LOW (ref 13.0–17.0)
Lymphocytes Relative: 11 %
Lymphs Abs: 1.7 10*3/uL (ref 0.7–4.0)
MCH: 25.3 pg — ABNORMAL LOW (ref 26.0–34.0)
MCHC: 32.8 g/dL (ref 30.0–36.0)
MCV: 77.1 fL — ABNORMAL LOW (ref 78.0–100.0)
Monocytes Absolute: 1.7 10*3/uL — ABNORMAL HIGH (ref 0.1–1.0)
Monocytes Relative: 11 %
Neutro Abs: 12.2 10*3/uL — ABNORMAL HIGH (ref 1.7–7.7)
Neutrophils Relative %: 77 %
Platelets: 432 10*3/uL — ABNORMAL HIGH (ref 150–400)
RBC: 4.23 MIL/uL (ref 4.22–5.81)
RDW: 14 % (ref 11.5–15.5)
WBC: 15.8 10*3/uL — ABNORMAL HIGH (ref 4.0–10.5)

## 2015-07-11 LAB — COMPREHENSIVE METABOLIC PANEL
ALT: 27 U/L (ref 17–63)
AST: 19 U/L (ref 15–41)
Albumin: 3.4 g/dL — ABNORMAL LOW (ref 3.5–5.0)
Alkaline Phosphatase: 73 U/L (ref 38–126)
Anion gap: 13 (ref 5–15)
BUN: 11 mg/dL (ref 6–20)
CO2: 24 mmol/L (ref 22–32)
Calcium: 8.5 mg/dL — ABNORMAL LOW (ref 8.9–10.3)
Chloride: 97 mmol/L — ABNORMAL LOW (ref 101–111)
Creatinine, Ser: 0.87 mg/dL (ref 0.61–1.24)
GFR calc Af Amer: >60 mL/min (ref >60)
GFR calc non Af Amer: >60 mL/min (ref >60)
Glucose, Bld: 117 mg/dL — ABNORMAL HIGH (ref 65–99)
Potassium: 3.1 mmol/L — ABNORMAL LOW (ref 3.5–5.1)
Sodium: 134 mmol/L — ABNORMAL LOW (ref 135–145)
Total Bilirubin: 0.5 mg/dL (ref 0.3–1.2)
Total Protein: 8 g/dL (ref 6.5–8.1)

## 2015-07-11 LAB — MAGNESIUM: Magnesium: 2 mg/dL (ref 1.7–2.4)

## 2015-07-11 LAB — BASIC METABOLIC PANEL
Anion gap: 12 (ref 5–15)
BUN: 11 mg/dL (ref 6–20)
CO2: 25 mmol/L (ref 22–32)
Calcium: 8.7 mg/dL — ABNORMAL LOW (ref 8.9–10.3)
Chloride: 99 mmol/L — ABNORMAL LOW (ref 101–111)
Creatinine, Ser: 0.94 mg/dL (ref 0.61–1.24)
GFR calc Af Amer: >60 mL/min (ref >60)
GFR calc non Af Amer: >60 mL/min (ref >60)
Glucose, Bld: 102 mg/dL — ABNORMAL HIGH (ref 65–99)
Potassium: 3.4 mmol/L — ABNORMAL LOW (ref 3.5–5.1)
Sodium: 136 mmol/L (ref 135–145)

## 2015-07-11 LAB — PROTIME-INR
INR: 1.24 (ref 0.00–1.49)
Prothrombin Time: 15.8 seconds — ABNORMAL HIGH (ref 11.6–15.2)

## 2015-07-11 LAB — TYPE AND SCREEN
ABO/RH(D): A POS
Antibody Screen: NEGATIVE

## 2015-07-11 LAB — APTT: aPTT: 30 seconds (ref 24–37)

## 2015-07-11 LAB — PHOSPHORUS: Phosphorus: 3.6 mg/dL (ref 2.5–4.6)

## 2015-07-11 LAB — POC OCCULT BLOOD, ED: Fecal Occult Bld: POSITIVE — AB

## 2015-07-11 LAB — TSH: TSH: 2.729 u[IU]/mL (ref 0.350–4.500)

## 2015-07-11 LAB — ABO/RH: ABO/RH(D): A POS

## 2015-07-11 SURGERY — EGD (ESOPHAGOGASTRODUODENOSCOPY)
Anesthesia: Moderate Sedation

## 2015-07-11 SURGERY — SIGMOIDOSCOPY, FLEXIBLE
Anesthesia: Moderate Sedation

## 2015-07-11 MED ORDER — POTASSIUM CHLORIDE CRYS ER 20 MEQ PO TBCR
40.0000 meq | EXTENDED_RELEASE_TABLET | Freq: Once | ORAL | Status: AC
  Administered 2015-07-11: 40 meq via ORAL
  Filled 2015-07-11: qty 2

## 2015-07-11 MED ORDER — ACETAMINOPHEN 325 MG PO TABS
650.0000 mg | ORAL_TABLET | Freq: Four times a day (QID) | ORAL | Status: DC | PRN
  Administered 2015-07-11: 650 mg via ORAL
  Filled 2015-07-11: qty 2

## 2015-07-11 MED ORDER — MESALAMINE 1.2 G PO TBEC
4.8000 g | DELAYED_RELEASE_TABLET | Freq: Every day | ORAL | Status: DC
  Administered 2015-07-11 – 2015-07-12 (×2): 4.8 g via ORAL
  Filled 2015-07-11 (×3): qty 4

## 2015-07-11 MED ORDER — CHLORHEXIDINE GLUCONATE 0.12 % MT SOLN
15.0000 mL | Freq: Two times a day (BID) | OROMUCOSAL | Status: DC
  Administered 2015-07-12: 15 mL via OROMUCOSAL
  Filled 2015-07-11 (×4): qty 15

## 2015-07-11 MED ORDER — METRONIDAZOLE 500 MG PO TABS
500.0000 mg | ORAL_TABLET | Freq: Three times a day (TID) | ORAL | Status: DC
  Administered 2015-07-11 (×3): 500 mg via ORAL
  Filled 2015-07-11 (×6): qty 1

## 2015-07-11 MED ORDER — SODIUM CHLORIDE 0.9 % IV BOLUS (SEPSIS)
1000.0000 mL | Freq: Once | INTRAVENOUS | Status: AC
  Administered 2015-07-11: 1000 mL via INTRAVENOUS

## 2015-07-11 MED ORDER — ENOXAPARIN SODIUM 40 MG/0.4ML ~~LOC~~ SOLN
40.0000 mg | SUBCUTANEOUS | Status: DC
  Administered 2015-07-11: 40 mg via SUBCUTANEOUS
  Filled 2015-07-11 (×2): qty 0.4

## 2015-07-11 MED ORDER — ACETAMINOPHEN 650 MG RE SUPP: 650.0000 mg | Freq: Four times a day (QID) | RECTAL | Status: DC | PRN

## 2015-07-11 MED ORDER — BUTAMBEN-TETRACAINE-BENZOCAINE 2-2-14 % EX AERO
INHALATION_SPRAY | CUTANEOUS | Status: DC | PRN
  Administered 2015-07-11: 1 via TOPICAL

## 2015-07-11 MED ORDER — ONDANSETRON HCL 4 MG/2ML IJ SOLN
4.0000 mg | Freq: Once | INTRAMUSCULAR | Status: AC
  Administered 2015-07-11: 4 mg via INTRAVENOUS
  Filled 2015-07-11: qty 2

## 2015-07-11 MED ORDER — DIPHENHYDRAMINE HCL 50 MG/ML IJ SOLN
INTRAMUSCULAR | Status: DC | PRN
  Administered 2015-07-11: 25 mg via INTRAVENOUS

## 2015-07-11 MED ORDER — MIDAZOLAM HCL 10 MG/2ML IJ SOLN
INTRAMUSCULAR | Status: DC | PRN
  Administered 2015-07-11: 2 mg via INTRAVENOUS
  Administered 2015-07-11: 3 mg via INTRAVENOUS
  Administered 2015-07-11 (×2): 2 mg via INTRAVENOUS

## 2015-07-11 MED ORDER — FENTANYL CITRATE (PF) 100 MCG/2ML IJ SOLN
INTRAMUSCULAR | Status: AC
  Filled 2015-07-11: qty 2

## 2015-07-11 MED ORDER — DIPHENHYDRAMINE HCL 50 MG/ML IJ SOLN
INTRAMUSCULAR | Status: AC
  Filled 2015-07-11: qty 1

## 2015-07-11 MED ORDER — SODIUM CHLORIDE 0.9 % IV SOLN
INTRAVENOUS | Status: DC
  Administered 2015-07-11 (×2): via INTRAVENOUS

## 2015-07-11 MED ORDER — ONDANSETRON HCL 4 MG PO TABS
4.0000 mg | ORAL_TABLET | Freq: Four times a day (QID) | ORAL | Status: DC | PRN
Start: 2015-07-11 — End: 2015-07-12

## 2015-07-11 MED ORDER — POTASSIUM CHLORIDE 10 MEQ/100ML IV SOLN
10.0000 meq | INTRAVENOUS | Status: AC
  Administered 2015-07-11: 10 meq via INTRAVENOUS
  Filled 2015-07-11 (×3): qty 100

## 2015-07-11 MED ORDER — ONDANSETRON HCL 4 MG/2ML IJ SOLN: 4.0000 mg | Freq: Four times a day (QID) | INTRAMUSCULAR | Status: DC | PRN

## 2015-07-11 MED ORDER — MIDAZOLAM HCL 5 MG/ML IJ SOLN
INTRAMUSCULAR | Status: AC
  Filled 2015-07-11: qty 2

## 2015-07-11 MED ORDER — FENTANYL CITRATE (PF) 100 MCG/2ML IJ SOLN
INTRAMUSCULAR | Status: DC | PRN
  Administered 2015-07-11: 50 ug via INTRAVENOUS
  Administered 2015-07-11 (×2): 25 ug via INTRAVENOUS

## 2015-07-11 MED ORDER — HYDROMORPHONE HCL 1 MG/ML IJ SOLN: 0.5000 mg | INTRAMUSCULAR | Status: DC | PRN

## 2015-07-11 MED ORDER — SODIUM CHLORIDE 0.9 % IJ SOLN
3.0000 mL | Freq: Two times a day (BID) | INTRAMUSCULAR | Status: DC
  Administered 2015-07-11: 3 mL via INTRAVENOUS

## 2015-07-11 MED ORDER — METHYLPREDNISOLONE SODIUM SUCC 40 MG IJ SOLR
40.0000 mg | Freq: Every day | INTRAMUSCULAR | Status: DC
  Administered 2015-07-11 – 2015-07-12 (×2): 40 mg via INTRAVENOUS
  Filled 2015-07-11 (×2): qty 1

## 2015-07-11 MED ORDER — LEVOTHYROXINE SODIUM 25 MCG PO TABS
25.0000 ug | ORAL_TABLET | Freq: Every day | ORAL | Status: DC
  Administered 2015-07-11 – 2015-07-12 (×2): 25 ug via ORAL
  Filled 2015-07-11 (×3): qty 1

## 2015-07-11 NOTE — ED Provider Notes (Signed)
CSN: [id number]     Arrival date & time 07/11/15  0344 History   None    Chief Complaint  Patient presents with  . Rectal Bleeding     (Consider location/radiation/quality/duration/timing/severity/associated sxs/prior Treatment) HPI  Blood pressure 139/78, pulse 87, temperature 98.1 F (36.7 C), temperature source Oral, resp. rate 18, height 5\' 5"  (1.651 m), weight 215 lb (97.523 kg), SpO2 99 %.  John [name] is a 34 y.o. male complaining of persistent bright red blood per rectum mixed with normally colored stool onset 1 week ago, tactile fever and left lower quadrant abdominal pain blood-streaked emesis onset at midnight last night. Patient has history of C. difficile, he's been tested twice in the last week. He had a colonoscopy in April by Dr. Carlean Purl are without abnormalities. Patient reports mild intermittent colicky left lower quadrant pain easily controlled with Tylenol and Vicodin also reports fever Tmax 103 x2 days ago. Patient also started to have bloody emesis onset last night, 2 episodes   Past Medical History  Diagnosis Date  . GERD (gastroesophageal reflux disease)   . 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 Esophageal reflux 06/08/2014  . Hemorrhoids, internal, with bleeding 06/08/2014   Past Surgical History  Procedure Laterality Date  . Knee arthroscopy Left    Family History  Problem Relation Age of Onset  . Diabetes Father   . Colon cancer Father 56  . Heart disease Father   . Hyperlipidemia Father   . Heart disease Mother   . Hyperlipidemia Mother   . Heart disease Brother   . Heart disease Maternal Grandmother   . Heart disease Maternal Grandfather   . Mental illness Maternal Grandfather   . Heart disease Paternal Grandmother   . Heart disease Paternal Grandfather   . Colon cancer Paternal Grandfather    Social History  Substance Use Topics  . Smoking status: Never Smoker   . Smokeless tobacco: Never Used  . Alcohol Use: No    Review of Systems  10  systems reviewed and found to be negative, except as noted in the HPI.   Allergies  Ibuprofen  Home Medications   Prior to Admission medications   Medication Sig Start Date End Date Taking? Authorizing Provider  acetaminophen (TYLENOL) 500 MG tablet Take 1,000 mg by mouth every 6 (six) hours as needed.   Yes Historical Provider, MD  LEVOTHROID 25 MCG tablet Take 1 tablet (25 mcg total) by mouth daily before breakfast. 04/12/15  Yes Mancel Bale, PA-C  metroNIDAZOLE (FLAGYL) 500 MG tablet Take 1 tablet (500 mg total) by mouth 3 (three) times daily. DO NOT CONSUME ALCOHOL WHILE TAKING THIS MEDICATION. 07/08/15  Yes Posey Boyer, MD  HYDROcodone-acetaminophen (NORCO) 5-325 MG per tablet Take 1 tablet by mouth every 4 (four) hours as needed. 07/08/15   Posey Boyer, MD   BP 135/67 mmHg  Pulse 79  Temp(Src) 98.8 F (37.1 C) (Oral)  Resp 18  Ht 5\' 5"  (1.651 m)  Wt 215 lb (97.523 kg)  BMI 35.78 kg/m2  SpO2 97% Physical Exam  Constitutional: He is oriented to person, place, and time. He appears well-developed and well-nourished. No distress.  HENT:  Head: Normocephalic.  Mouth/Throat: Oropharynx is clear and moist.  Eyes: Conjunctivae and EOM are normal. Pupils are equal, round, and reactive to light.  Neck: Normal range of motion.  Cardiovascular: Normal rate, regular rhythm and intact distal pulses.   Pulmonary/Chest: Effort normal and breath sounds normal.  No stridor. No respiratory distress. He has no wheezes. He has no rales. He exhibits no tenderness.  Abdominal: Soft. Bowel sounds are normal. He exhibits no distension and no mass. There is no tenderness. There is no rebound and no guarding.  Genitourinary:  Digital rectal exam with normal rectal tone, normal stool color.   Musculoskeletal: Normal range of motion.  Neurological: He is alert and oriented to person, place, and time.  Psychiatric: He has a normal mood and affect.  Nursing note and vitals reviewed.   ED Course   Procedures (including critical care time) Labs Review Labs Reviewed  COMPREHENSIVE METABOLIC PANEL - Abnormal; Notable for the following:    Sodium 134 (*)    Potassium 3.1 (*)    Chloride 97 (*)    Glucose, Bld 117 (*)    Calcium 8.5 (*)    Albumin 3.4 (*)    All other components within normal limits  CBC - Abnormal; Notable for the following:    WBC 17.3 (*)    Hemoglobin 10.9 (*)    HCT 33.0 (*)    MCV 77.1 (*)    MCH 25.5 (*)    Platelets 433 (*)    All other components within normal limits  POC OCCULT BLOOD, ED - Abnormal; Notable for the following:    Fecal Occult Bld POSITIVE (*)    All other components within normal limits  CULTURE, BLOOD (ROUTINE X 2)  CULTURE, BLOOD (ROUTINE X 2)  TYPE AND SCREEN  ABO/RH    Imaging Review No results found. I have personally reviewed and evaluated these images and lab results as part of my medical decision-making.   EKG Interpretation None      MDM   Final diagnoses:  Lower GI bleed  Thrombocythemia  Leukocytosis    Filed Vitals:   07/11/15 0400 07/11/15 0654  BP: 139/78 135/67  Pulse: 87 79  Temp: 98.1 F (36.7 C) 98.8 F (37.1 C)  TempSrc: Oral Oral  Resp: 18 18  Height: 5\' 5"  (1.651 m)   Weight: 215 lb (97.523 kg)   SpO2: 99% 97%    Medications  sodium chloride 0.9 % bolus 1,000 mL (1,000 mLs Intravenous New Bag/Given 07/11/15 0704)  ondansetron (ZOFRAN) injection 4 mg (4 mg Intravenous Given 07/11/15 0704)  potassium chloride SA (K-DUR,KLOR-CON) CR tablet 40 mEq (40 mEq Oral Given 07/11/15 0704)    John [name] is a pleasant 34 y.o. male presenting with persistent bloody diarrhea over the course of one week. Patient has mild intermittent colicky left lower abdominal pain, abdominal exam is benign today. He declines pain medication. Patient has had 2 negative C. difficile test in the last week. Patient reports fever at home, he is taking Flagyl for presumed C. difficile. He follows with Dr. Carlean Purl who has a  flexible sigmoidoscopy scheduled tomorrow at 7:30 AM. Patients white blood cell count is rising to 17.3. His potassium is low at 3.1. H/H 10.9/33.0, was 11.1/35.6 x3 days ago and 12.0/36.0 3 months ago.   Gastroenterology consult from Dr. Hilarie Fredrickson appreciated: He recommends admitting the patient to the hospital for serial CBCs, he will expedite the flexible sigmoidoscopy. Dr. Charlies Silvers will admit Pt to tele bed.    Monico Blitz, PA-C 07/11/15 8786  Jola Schmidt, MD 07/11/15 [id number]

## 2015-07-11 NOTE — H&P (Addendum)
[facility] History and Physical  John [name] YFV:[id number] DOB: 04/10/1981 DOA: 07/11/2015  Referring physician: ER PA: Monico Blitz PCP: Merri Ray DAVID, MD  Chief Complaint: Bloody diarrhea  HPI:  34 year old male with past medical history of hypothyroidism, has had C. difficile infection about few months ago and was treated with Flagyl and because of persistent diarrhea he was changed to vancomycin. He subsequently has gotten better although stool was never fully formed. He recently had oral surgery and was placed on clindamycin and then he noted his diarrhea worsened. Patient reports blood in the stool but no pus. He has had quite a few episodes of diarrhea prior to this admission. He was seen in urgent care center 4 days prior to this admission and was placed back on Flagyl. His stool was sent for C. difficile analysis and it was negative. Because the diarrhea never improved even with Flagyl he was advised to come to ED for evaluation. He reports associated fevers of up to 10 31F on Saturday (4 days ago). No significant reports of abdominal pain. No nausea or vomiting. No other reports of chest pain, shortness of breath or palpitations. No lightheadedness or dizziness or loss of consciousness.  In ED, patient was hemodynamically stable. Blood work was notable for white blood cell count of 17.3, hemoglobin 10.9, sodium 134, potassium 3.1. GI has seen the patient in consultation and plans for endoscopy and flexible sigmoidoscopy today.   Assessment & Plan    Principal Problem:   Bloody diarrhea / Acute blood loss anemia - Patient presents with bloody diarrhea. Of concern is obviously C. difficile infection. He has had C. difficile about few months ago prior to this admission but most recent stool analysis negative for C. Difficile. Stool sent for GI pathogen panel and stool culture. - We resume the patient's Flagyl which he was started 07/08/2015. We'll see with GI if they  still recommend continuing this. - Continue supportive care with IV fluids. - GI has seen the patient in consultation and recommended endoscopy and flexible sigmoidoscopy. - Hemoglobin is stable at 10.9. Continue to monitor CBC on daily basis. - Continue to keep nothing by mouth prior to procedures.  Active Problems:   Leukocytosis - Unclear etiology. Possible infectious diarrhea, unspecified organism at this time. As mentioned, recent stool for C. difficile was negative. - GI pathogen panel and stool culture is pending - We have resumed patient's Flagyl which was started 07/08/2015.    Hypothyroidism - Resume Synthroid. - Check TSH      Hypokalemia - Secondary to GI losses.  - Supplemented.     Hyponatremia - Likely prerenal, secondary to GI losses.  - Continue IV fluids.  - Follow-up BMP tomorrow morning.     DVT prophylaxis:  - SCD's bilaterally   Radiological Exams on Admission: No results found.   Code Status: Full Family Communication: Plan of care discussed with the patient and his parents at the bedside  Disposition Plan: Admit for further evaluation  Leisa Lenz, MD  [facility] Pager [phone]  Time spent in minutes: 75 minutes  Review of Systems:  Constitutional: Negative for fever, chills and malaise/fatigue. Negative for diaphoresis.  HENT: Negative for hearing loss, ear pain, nosebleeds, congestion, sore throat, neck pain, tinnitus and ear discharge.   Eyes: Negative for blurred vision, double vision, photophobia, pain, discharge and redness.  Respiratory: Negative for cough, hemoptysis, sputum production, shortness of breath, wheezing and stridor.   Cardiovascular: Negative for chest pain, palpitations, orthopnea, claudication  and leg swelling.  Gastrointestinal: per GI.  Genitourinary: Negative for dysuria, urgency, frequency, hematuria and flank pain.  Musculoskeletal: Negative for myalgias, back pain, joint pain and falls.  Skin: Negative  for itching and rash.  Neurological: Negative for dizziness and weakness. Negative for tingling, tremors, sensory change, speech change, focal weakness, loss of consciousness and headaches.  Endo/Heme/Allergies: Negative for environmental allergies and polydipsia. Does not bruise/bleed easily.  Psychiatric/Behavioral: Negative for suicidal ideas. The patient is not nervous/anxious.      Past Medical History  Diagnosis Date  . GERD (gastroesophageal reflux disease)   . 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 Esophageal reflux 06/08/2014  . Hemorrhoids, internal, with bleeding 06/08/2014   Past Surgical History  Procedure Laterality Date  . Knee arthroscopy Left    Social History:  reports that he has never smoked. He has never used smokeless tobacco. He reports that he does not drink alcohol or use illicit drugs.  Allergies  Allergen Reactions  . Ibuprofen     Lips swelling, rash, legs hurting    Family History:  Family History  Problem Relation Age of Onset  . Diabetes Father   . Colon cancer Father 107  . Heart disease Father   . Hyperlipidemia Father   . Heart disease Mother   . Hyperlipidemia Mother   . Heart disease Brother   . Heart disease Maternal Grandmother   . Heart disease Maternal Grandfather   . Mental illness Maternal Grandfather   . Heart disease Paternal Grandmother   . Heart disease Paternal Grandfather   . Colon cancer Paternal Grandfather      Prior to Admission medications   Medication Sig Start Date End Date Taking? Authorizing Adoni Greenough  acetaminophen (TYLENOL) 500 MG tablet Take 1,000 mg by mouth every 6 (six) hours as needed.   Yes Historical Caitlyn Buchanan, MD  LEVOTHROID 25 MCG tablet Take 1 tablet (25 mcg total) by mouth daily before breakfast. 04/12/15  Yes Mancel Bale, PA-C  metroNIDAZOLE (FLAGYL) 500 MG tablet Take 1 tablet (500 mg total) by mouth 3 (three) times daily. DO NOT CONSUME ALCOHOL WHILE TAKING THIS MEDICATION. 07/08/15  Yes Posey Boyer,  MD  HYDROcodone-acetaminophen (NORCO) 5-325 MG per tablet Take 1 tablet by mouth every 4 (four) hours as needed. 07/08/15   Posey Boyer, MD   Physical Exam: Filed Vitals:   07/11/15 0400 07/11/15 0654  BP: 139/78 135/67  Pulse: 87 79  Temp: 98.1 F (36.7 C) 98.8 F (37.1 C)  TempSrc: Oral Oral  Resp: 18 18  Height: 5' 5"  (1.651 m)   Weight: 97.523 kg (215 lb)   SpO2: 99% 97%    Physical Exam  Constitutional: Appears well-developed and well-nourished. No distress.  HENT: Normocephalic. No tonsillar erythema or exudates Eyes: Conjunctivae  are normal. No scleral icterus.  Neck: Normal ROM. Neck supple. No JVD. No tracheal deviation. No thyromegaly.  CVS: RRR, S1/S2 +, no murmurs, no gallops, no carotid bruit.  Pulmonary: Effort and breath sounds normal, no stridor, rhonchi, wheezes, rales.  Abdominal: Soft. BS +,  no distension, tenderness, rebound or guarding.  Musculoskeletal: Normal range of motion. No edema and no tenderness.  Lymphadenopathy: No lymphadenopathy noted, cervical, inguinal. Neuro: Alert. Normal reflexes, muscle tone coordination. No focal neurologic deficits. Skin: Skin is warm and dry. No rash noted.  No erythema. No pallor.  Psychiatric: Normal mood and affect. Behavior, judgment, thought content normal.   Labs on Admission:  Basic Metabolic Panel:  Recent Labs Lab 07/11/15 0441  NA 134*  K 3.1*  CL 97*  CO2 24  GLUCOSE 117*  BUN 11  CREATININE 0.87  CALCIUM 8.5*   Liver Function Tests:  Recent Labs Lab 07/11/15 0441  AST 19  ALT 27  ALKPHOS 73  BILITOT 0.5  PROT 8.0  ALBUMIN 3.4*   No results for input(s): LIPASE, AMYLASE in the last 168 hours. No results for input(s): AMMONIA in the last 168 hours. CBC:  Recent Labs Lab 07/08/15 1041 07/11/15 0441  WBC 12.9* 17.3*  HGB 11.1* 10.9*  HCT 35.6* 33.0*  MCV 76.3* 77.1*  PLT  --  433*   Cardiac Enzymes: No results for input(s): CKTOTAL, CKMB, CKMBINDEX, TROPONINI in the last  168 hours. BNP: Invalid input(s): POCBNP CBG: No results for input(s): GLUCAP in the last 168 hours.  If 7PM-7AM, please contact night-coverage [email or web address] Password [facility] 07/11/2015, 7:27 AM

## 2015-07-11 NOTE — Op Note (Signed)
[facility] [place], [id number]   FLEXIBLE SIGMOIDOSCOPY PROCEDURE REPORT  PATIENT: [name], John A  MR#: [id number] BIRTHDATE: 28-May-1981 , 42  yrs. old GENDER: male ENDOSCOPIST: Jerene Bears, MD PROCEDURE DATE:  07/11/2015 PROCEDURE:   Sigmoidoscopy, diagnostic and Sigmoidoscopy with biopsy  ASA CLASS:   Class II INDICATIONS:Bloody diarrhea, lower abdominal cramping, history of C. difficile with recently negative PCR. MEDICATIONS: this was the total dose used for all procedures at this session, Benadryl 25 mg IV, Fentanyl 100 mcg IV, and Versed 8 mg IV   DESCRIPTION OF PROCEDURE:   After the risks benefits and alternatives of the procedure were thoroughly explained, informed consent was obtained.  Digital exam revealed no abnormalities of the rectum. The Pentax adult colonoscope was introduced through the anus  and advanced to the descending colon , The exam was Without limitations.    The quality of the prep was adequate.  Estimated blood loss is zero unless otherwise noted in this procedure report. The instrument was then slowly withdrawn as the mucosa was fully examined   COLON FINDINGS: Moderate to severe colitis and proctitis throughout the examined colon (rectum to distal descending colon). Inflammation is continuous and described by edema, erythema, superficial ulceration, friability, granularity, and loss of normal mucosal pattern. The appearance is consistent with ulcerative colitis.  Multiple biopsies of the area were performed using cold forceps.    Retroflexion was not performed.    The scope was then withdrawn from the patient and the procedure terminated.  COMPLICATIONS: There were no immediate complications.  ENDOSCOPIC IMPRESSION: Moderate to severe colitis from the rectum to distal descending colon, most consistent with IBD; multiple biopsies obtained      RECOMMENDATIONS: 1.  Begin methylprednisolone 40 mg IV 2.   Begin Lialda 4.8 g daily 3.  Avoid NSAIDs 4.  Office follow-up with Dr.  Carlean Purl  eSigned:  Jerene Bears, MD 07/11/2015 3:40 PM   CC: the patient, Silvano Rusk, PCP  PATIENT NAME:  [name], John A MR#: [id number]

## 2015-07-11 NOTE — Care Management Note (Signed)
Case Management Note  Patient Details  Name: John [name] MRN: [id number] Date of Birth: [birth year]  Subjective/Objective:                   Bloody diarrhea, nausea,vomiting Fever,and hematemesis Action/Plan:  Discharge planning Expected Discharge Date:   (unknown)               Expected Discharge Plan:  Home/Self Care  In-House Referral:     Discharge planning Services  CM Consult  Post Acute Care Choice:    Choice offered to:     DME Arranged:    DME Agency:     HH Arranged:    HH Agency:     Status of Service:  In process, will continue to follow  Medicare Important Message Given:    Date Medicare IM Given:    Medicare IM give by:    Date Additional Medicare IM Given:    Additional Medicare Important Message give by:     If discussed at [place] of Stay Meetings, dates discussed:    Additional Comments: Utilization Review complete.  CM will continue to follow for needs; anticipate home self care at discharge.   Dellie Catholic, RN 07/11/2015, 1:00 PM

## 2015-07-11 NOTE — Consult Note (Signed)
Consultation  Referring Provider: [facility] Primary Care Physician:  John Ray DAVID, MD Primary Gastroenterologist:  Dr.Gessner  Reason for Consultation:   Bloody diarrhea, nausea,vomiting  Fever,and hematemesis  HPI: John [name] is a 34 y.o. male   Known to Dr.Gessner  Who had undergone Colonoscopy in 10/2014 for screening due to family hx of Colon cancer in pt's father,-this was a negative exam except for small internal hemorrhoids. Pt was then seen in 03/2015 with c/o one month hx of diarrhea with 3-4 BM's daily. He was found to be C diff +, presumably community acquired as no recent antibiotics, etc. He was treated initially with Flagyl, then required vancomycin due to persistent sxs. He has not ever gotten completely back to his baseline , and had continued with intermittent mushy to loose stools but only 1-2 daily.  Unfortunately he was treated with Clindamycin  About 2 weeks ago after a dental surgery , and started having diarrhea again which became bloody. He has had BM's every 2-3 hours over the past week all mixed with blood. He c/o some lower abdominal   Cramping. 4 nights ago he had a temp 203 for at home last evening had a temp of 101. He has had progressive weakness and malaise. He had gone to urgent care on 07/06/2015 and was restarted on metronidazole. He also was retested for C. difficile on 9:15 which was negative and again on 07/08/2015 which was negative. He became nauseated about 3 days ago but has only had a couple of episodes of vomiting area and one of those was last evening and he vomited up some streaks of bright red blood. He presented to the emergency room early this morning, because of all of the above symptoms. Labs today show WBC of 17.3, hemoglobin 10.9 hematocrit of 33.1 and MCV of 77, potassium of 3.1. Hemoglobin had been 12 in June.   Past Medical History  Diagnosis Date  . GERD (gastroesophageal reflux disease)   . 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  Esophageal reflux 06/08/2014  . Hemorrhoids, internal, with bleeding 06/08/2014    Past Surgical History  Procedure Laterality Date  . Knee arthroscopy Left     Prior to Admission medications   Medication Sig Start Date End Date Taking? Authorizing Provider  acetaminophen (TYLENOL) 500 MG tablet Take 1,000 mg by mouth every 6 (six) hours as needed.   Yes Historical Provider, MD  LEVOTHROID 25 MCG tablet Take 1 tablet (25 mcg total) by mouth daily before breakfast. 04/12/15  Yes Mancel Bale, PA-C  metroNIDAZOLE (FLAGYL) 500 MG tablet Take 1 tablet (500 mg total) by mouth 3 (three) times daily. DO NOT CONSUME ALCOHOL WHILE TAKING THIS MEDICATION. 07/08/15  Yes Posey Boyer, MD  HYDROcodone-acetaminophen (NORCO) 5-325 MG per tablet Take 1 tablet by mouth every 4 (four) hours as needed. 07/08/15   Posey Boyer, MD    No current facility-administered medications for this encounter.   Current Outpatient Prescriptions  Medication Sig Dispense Refill  . acetaminophen (TYLENOL) 500 MG tablet Take 1,000 mg by mouth every 6 (six) hours as needed.    Marland Kitchen LEVOTHROID 25 MCG tablet Take 1 tablet (25 mcg total) by mouth daily before breakfast. 30 tablet 2  . metroNIDAZOLE (FLAGYL) 500 MG tablet Take 1 tablet (500 mg total) by mouth 3 (three) times daily. DO NOT CONSUME ALCOHOL WHILE TAKING THIS MEDICATION. 30 tablet 0  . HYDROcodone-acetaminophen (NORCO) 5-325 MG per tablet Take  1 tablet by mouth every 4 (four) hours as needed. 12 tablet 0    Allergies as of 07/11/2015 - Review Complete 07/11/2015  Allergen Reaction Noted  . Ibuprofen  06/29/2015    Family History  Problem Relation Age of Onset  . Diabetes Father   . Colon cancer Father 82  . Heart disease Father   . Hyperlipidemia Father   . Heart disease Mother   . Hyperlipidemia Mother   . Heart disease Brother   . Heart disease Maternal Grandmother   . Heart disease Maternal Grandfather   . Mental illness Maternal Grandfather   . Heart  disease Paternal Grandmother   . Heart disease Paternal Grandfather   . Colon cancer Paternal Grandfather     Social History   Social History  . Marital Status: Married    Spouse Name: N/A  . Number of Children: 2  . Years of Education: N/A   Occupational History  . Diesel Statistician   Social History Main Topics  . Smoking status: Never Smoker   . Smokeless tobacco: Never Used  . Alcohol Use: No  . Drug Use: No  . Sexual Activity: Yes   Other Topics Concern  . Not on file   Social History Narrative   Married (Dottie Swingle CMA [facility]), 1 daughter born 2014   Engineer, building services [place]   Enjoys softball    Review of Systems: Pertinent positive and negative review of systems were noted in the above HPI section.  All other review of systems was otherwise negative.  Physical Exam: Vital signs in last 24 hours: Temp:  [98.1 F (36.7 C)-98.8 F (37.1 C)] 98.8 F (37.1 C) (09/20 0654) Pulse Rate:  [79-87] 79 (09/20 0654) Resp:  [18] 18 (09/20 0654) BP: (135-139)/(67-78) 135/67 mmHg (09/20 0654) SpO2:  [97 %-99 %] 97 % (09/20 0654) Weight:  [215 lb (97.523 kg)] 215 lb (97.523 kg) (09/20 0400)   General:   Alert,  Well-developed, well-nourished,WM  pleasant and cooperative in NAD, several family members at bedside Head:  Normocephalic and atraumatic. Eyes:  Sclera clear, no icterus.   Conjunctiva pink. Ears:  Normal auditory acuity. Nose:  No deformity, discharge,  or lesions. Mouth:  No deformity or lesions.   Neck:  Supple; no masses or thyromegaly. Lungs:  Clear throughout to auscultation.   No wheezes, crackles, or rhonchi. Heart:  Regular rate and rhythm; no murmurs, clicks, rubs,  or gallops. Abdomen:  Soft,tender LMQ/LLQ,no guarding or rebound, BS active,nonpalp mass or hsm.   Rectal: not done -bloody stool noted per ER Msk:  Symmetrical without gross deformities. . Pulses:  Normal pulses noted. Extremities:  Without clubbing or  edema. Neurologic:  Alert and  oriented x4;  grossly normal neurologically. Skin:  Intact without significant lesions or rashes.. Psych:  Alert and cooperative. Normal mood and affect.  Intake/Output from previous day:   Intake/Output this shift:    Lab Results:  Recent Labs  07/08/15 1041 07/11/15 0441  WBC 12.9* 17.3*  HGB 11.1* 10.9*  HCT 35.6* 33.0*  PLT  --  433*   BMET  Recent Labs  07/11/15 0441  NA 134*  K 3.1*  CL 97*  CO2 24  GLUCOSE 117*  BUN 11  CREATININE 0.87  CALCIUM 8.5*   LFT  Recent Labs  07/11/15 0441  PROT 8.0  ALBUMIN 3.4*  AST 19  ALT 27  ALKPHOS 73  BILITOT 0.5   PT/INR No results for input(s): LABPROT,  INR in the last 72 hours. Hepatitis Panel No results for input(s): HEPBSAG, HCVAB, HEPAIGM, HEPBIGM in the last 72 hours.        IMPRESSION:   #25 34 year old male with history of C. difficile initially diagnosed June 2016 treated with metronidazole and then vancomycin. He has had recurrence of profuse diarrhea over the past week after a course of clindamycin. Diarrhea has been bloody, he has lower abdominal crampy pain fever, nausea, and one episode of blood-streaked emesis. 2 recent C. difficile have been negative though remains on differential Rule out other infectious etiologies. Am concerned that he may have underlying IBD. Hematemesis was minimal with blood streaking and likely secondary to retching and/or mild esophagitis #2 anemia now microcytic #3 hypokalemia secondary to above #4 hypothyroidism #5 family history of colon cancer-colonoscopy January 2016 negative septum internal hemorrhoids.  PLAN: #1 GI pathogen panel and repeat stool for C. difficile have been collected #2 hydrate, IV PPI and antiemetics #3 Will schedule for unprepped flexible sigmoidoscopy and EGD with Dr.Pyrtle this afternoon. Both procedures have been explained in detail the to the patient and family and they are agreeable to proceed. #4  potassium replacement  per medicine service   Amy Valley  07/11/2015, 8:52 AM

## 2015-07-11 NOTE — ED Notes (Signed)
Bed: WTR5 Expected date:  Expected time:  Means of arrival:  Comments: 

## 2015-07-11 NOTE — Op Note (Signed)
[facility] [place], [address]   ENDOSCOPY PROCEDURE REPORT  PATIENT: John [name], John [name]  MR#: [id number] BIRTHDATE: 04/05/1981 , 34  yrs. old GENDER: male ENDOSCOPIST: Jerene Bears, MD PROCEDURE DATE:  07/11/2015 PROCEDURE:  EGD, diagnostic ASA CLASS:     Class II INDICATIONS:  nausea, vomiting, scant hematemesis, bloody diarrhea.  MEDICATIONS: this was the total dose used for all procedures at this session, Benadryl 25 mg IV, Fentanyl 100 mcg IV, and Versed 9 mg IV  TOPICAL ANESTHETIC: Cetacaine Spray  DESCRIPTION OF PROCEDURE: After the risks benefits and alternatives of the procedure were thoroughly explained, informed consent was obtained.  The Pentax Gastroscope [id number] endoscope was introduced through the mouth and advanced to the second portion of the duodenum , Without limitations.  The instrument was slowly withdrawn as the mucosa was fully examined.  ESOPHAGUS: Reflux esophagitis was found at the gastroesophageal junction.  Esophagitis was LA Class B.  Esophagus otherwise normal   STOMACH: [name] small hiatal hernia was noted.   Acute gastritis (erythema) was found in the cardia.  This is consistent with retch-related gastritis.  The stomach otherwise appeared normal.  DUODENUM: The duodenal mucosa showed no abnormalities in the bulb and 2nd part of the duodenum.  Retroflexed views revealed as previously described.     The scope was then withdrawn from the patient and the procedure completed.  COMPLICATIONS: There were no immediate complications.  ENDOSCOPIC IMPRESSION: 1.   Reflux esophagitis at the gastroesophageal junction 2.   Small hiatal hernia 3.   Retch gastritis (inflammation) was found in the cardia 4.   The stomach otherwise appeared normal 5.   The duodenal mucosa showed no abnormalities in the bulb and 2nd part of the duodenum  RECOMMENDATIONS: 1.  Zantac 75 mg or Pepcid 20 mg twice daily for 2-4 weeks 2.  Anti-emetics  as needed  eSigned:  Jerene Bears, MD 07/11/2015 3:46 PM  CC: the patient, PCP, Silvano Rusk

## 2015-07-11 NOTE — ED Notes (Signed)
Report given to Elnita Maxwell, RN  Tech asked pt if he could provide a stool sample at this time, pt said he could not.

## 2015-07-11 NOTE — ED Notes (Signed)
Pt complains f bright red rectal bleeding for one week and an intermittent fever

## 2015-07-11 NOTE — ED Notes (Signed)
Pt has hx of cdiff, was negative on Thursday

## 2015-07-12 ENCOUNTER — Other Ambulatory Visit: Payer: Managed Care, Other (non HMO) | Admitting: Internal Medicine

## 2015-07-12 DIAGNOSIS — K6389 Other specified diseases of intestine: Secondary | ICD-10-CM

## 2015-07-12 LAB — CBC
HCT: 32.1 % — ABNORMAL LOW (ref 39.0–52.0)
Hemoglobin: 10.5 g/dL — ABNORMAL LOW (ref 13.0–17.0)
MCH: 25.4 pg — ABNORMAL LOW (ref 26.0–34.0)
MCHC: 32.7 g/dL (ref 30.0–36.0)
MCV: 77.5 fL — ABNORMAL LOW (ref 78.0–100.0)
Platelets: 449 10*3/uL — ABNORMAL HIGH (ref 150–400)
RBC: 4.14 MIL/uL — ABNORMAL LOW (ref 4.22–5.81)
RDW: 14.2 % (ref 11.5–15.5)
WBC: 16.9 10*3/uL — ABNORMAL HIGH (ref 4.0–10.5)

## 2015-07-12 LAB — BASIC METABOLIC PANEL
Anion gap: 10 (ref 5–15)
BUN: 13 mg/dL (ref 6–20)
CO2: 26 mmol/L (ref 22–32)
Calcium: 8.9 mg/dL (ref 8.9–10.3)
Chloride: 101 mmol/L (ref 101–111)
Creatinine, Ser: 0.84 mg/dL (ref 0.61–1.24)
GFR calc Af Amer: >60 mL/min (ref >60)
GFR calc non Af Amer: >60 mL/min (ref >60)
Glucose, Bld: 140 mg/dL — ABNORMAL HIGH (ref 65–99)
Potassium: 3.9 mmol/L (ref 3.5–5.1)
Sodium: 137 mmol/L (ref 135–145)

## 2015-07-12 LAB — GLUCOSE, CAPILLARY: Glucose-Capillary: 108 mg/dL — ABNORMAL HIGH (ref 65–99)

## 2015-07-12 MED ORDER — MESALAMINE 1.2 G PO TBEC: 4.8000 g | DELAYED_RELEASE_TABLET | Freq: Every day | ORAL | Status: DC

## 2015-07-12 MED ORDER — RANITIDINE HCL 75 MG PO TABS: 75.0000 mg | ORAL_TABLET | Freq: Two times a day (BID) | ORAL | Status: DC

## 2015-07-12 MED ORDER — PREDNISONE 10 MG PO TABS: 40.0000 mg | ORAL_TABLET | Freq: Every day | ORAL | Status: DC

## 2015-07-12 NOTE — Progress Notes (Signed)
Patient ID: John [name], male   DOB: Jan 04, 1981, 34 y.o.   MRN: [id number]    Progress Note   Subjective   Feeling better- stool frequency the same but no blood with last BM. Denies any pain or cramping, feels he could eat -no further nausea or vomiting. Stool studies pending  Colon bx pending   Objective   Vital signs in last 24 hours: Temp:  [98.5 F (36.9 C)-102.5 F (39.2 C)] 98.5 F (36.9 C) (09/21 4332) Pulse Rate:  [70-139] 70 (09/21 0614) Resp:  [20-31] 20 (09/21 0614) BP: (128-213)/(58-109) 129/73 mmHg (09/21 0614) SpO2:  [93 %-100 %] 99 % (09/21 0614) Last BM Date: 07/11/15 General:    white male  in NAD Heart:  Regular rate and rhythm; no murmurs Lungs: Respirations even and unlabored, lungs CTA bilaterally Abdomen:  Soft, minimally tender and nondistended. Normal bowel sounds. Extremities:  Without edema. Neurologic:  Alert and oriented,  grossly normal neurologically. Psych:  Cooperative. Normal mood and affect.  Intake/Output from previous day:   Intake/Output this shift:    Lab Results:  Recent Labs  07/11/15 0441 07/11/15 1015 07/12/15 0543  WBC 17.3* 15.8* 16.9*  HGB 10.9* 10.7* 10.5*  HCT 33.0* 32.6* 32.1*  PLT 433* 432* 449*   BMET  Recent Labs  07/11/15 0441 07/11/15 1015 07/12/15 0543  NA 134* 136 137  K 3.1* 3.4* 3.9  CL 97* 99* 101  CO2 24 25 26   GLUCOSE 117* 102* 140*  BUN 11 11 13   CREATININE 0.87 0.94 0.84  CALCIUM 8.5* 8.7* 8.9   LFT  Recent Labs  07/11/15 0441  PROT 8.0  ALBUMIN 3.4*  AST 19  ALT 27  ALKPHOS 73  BILITOT 0.5   PT/INR  Recent Labs  07/11/15 1015  LABPROT 15.8*  INR 1.24         Assessment / Plan:    #1 34 yo male with new dx of colitis- probably ulcerative colitis-moderately severe BX pending as are stool studies but negative for cdiff within past week. He is stable, and feeling better [facility] for discharge home today after he get Solumedrol this am Please discharge on prednisone  40 mg  po qam x 2 weeks then decrease to 30 mg po daily- will taper thereafter.  Lialda  4 tabs po qam  Stop Flagyl  imodium as needed Soft diet  home to rest-out of work this week  Will arrange follow up with Amy Esterwood PA-C or Dr Carlean Purl in 2 weeks- pt's wife works at our office- they are aware to call for any worsening of sxs after disharge   Principal Problem:   Bloody diarrhea Active Problems:   Hypothyroidism   Acute blood loss anemia   Leukocytosis   Hypokalemia   Hyponatremia     LOS: 1 day   Amy Esterwood  07/12/2015, 9:27 AM

## 2015-07-12 NOTE — Progress Notes (Signed)
Pt discharged home. IV d/c with cath intact. VSS. Discharge instructions with prescriptions given and pt verbalized understanding.

## 2015-07-12 NOTE — Discharge Instructions (Addendum)
Bloody Stools Bloody stools often mean that there is a problem in the digestive tract. Your caregiver may use the term "melena" to describe black, tarry, and bad smelling stools or "hematochezia" to describe red or maroon-colored stools. Blood seen in the stool can be caused by bleeding anywhere along the intestinal tract.  A black stool usually means that blood is coming from the upper part of the gastrointestinal tract (esophagus, stomach, or small bowel). Passing maroon-colored stools or bright red blood usually means that blood is coming from lower down in the large bowel or the rectum. However, sometimes massive bleeding in the stomach or small intestine can cause bright red bloody stools.  Consuming black licorice, lead, iron pills, medicines containing bismuth subsalicylate, or blueberries can also cause black stools. Your caregiver can test black stools to see if blood is present. It is important that the cause of the bleeding be found. Treatment can then be started, and the problem can be corrected. Rectal bleeding may not be serious, but you should not assume everything is okay until you know the cause.It is very important to follow up with your caregiver or a specialist in gastrointestinal problems. CAUSES  Blood in the stools can come from various underlying causes.Often, the cause is not found during your first visit. Testing is often needed to discover the cause of bleeding in the gastrointestinal tract. Causes range from simple to serious or even life-threatening.Possible causes include:  Hemorrhoids.These are veins that are full of blood (engorged) in the rectum. They cause pain, inflammation, and may bleed.  Anal fissures.These are areas of painful tearing which may bleed. They are often caused by passing hard stool.  Diverticulosis.These are pouches that form on the colon over time, with age, and may bleed significantly.  Diverticulitis.This is inflammation in areas with  diverticulosis. It can cause pain, fever, and bloody stools, although bleeding is rare.  Proctitis and colitis. These are inflamed areas of the rectum or colon. They may cause pain, fever, and bloody stools.  Polyps and cancer. Colon cancer is a leading cause of preventable cancer death.It often starts out as precancerous polyps that can be removed during a colonoscopy, preventing progression into cancer. Sometimes, polyps and cancer may cause rectal bleeding.  Gastritis and ulcers.Bleeding from the upper gastrointestinal tract (near the stomach) may travel through the intestines and produce black, sometimes tarry, often bad smelling stools. In certain cases, if the bleeding is fast enough, the stools may not be black, but red and the condition may be life-threatening. SYMPTOMS  You may have stools that are bright red and bloody, that are normal color with blood on them, or that are dark black and tarry. In some cases, you may only have blood in the toilet bowl. Any of these cases need medical care. You may also have:  Pain at the anus or anywhere in the rectum.  Lightheadedness or feeling faint.  Extreme weakness.  Nausea or vomiting.  Fever. DIAGNOSIS Your caregiver may use the following methods to find the cause of your bleeding:  Taking a medical history. Age is important. Older people tend to develop polyps and cancer more often. If there is anal pain and a hard, large stool associated with bleeding, a tear of the anus may be the cause. If blood drips into the toilet after a bowel movement, bleeding hemorrhoids may be the problem. The color and frequency of the bleeding are additional considerations. In most cases, the medical history provides clues, but seldom the final  answer.  A visual and finger (digital) exam. Your caregiver will inspect the anal area, looking for tears and hemorrhoids. A finger exam can provide information when there is tenderness or a growth inside. In men, the  prostate is also examined.  Endoscopy. Several types of small, long scopes (endoscopes) are used to view the colon.  In the office, your caregiver may use a rigid, or more commonly, a flexible viewing sigmoidoscope. This exam is called flexible sigmoidoscopy. It is performed in 5 to 10 minutes.  A more thorough exam is accomplished with a colonoscope. It allows your caregiver to view the entire 5 to 6 foot long colon. Medicine to help you relax (sedative) is usually given for this exam. Frequently, a bleeding lesion may be present beyond the reach of the sigmoidoscope. So, a colonoscopy may be the best exam to start with. Both exams are usually done on an outpatient basis. This means the patient does not stay overnight in the hospital or surgery center.  An upper endoscopy may be needed to examine your stomach. Sedation is used and a flexible endoscope is put in your mouth, down to your stomach.  A barium enema X-ray. This is an X-ray exam. It uses liquid barium inserted by enema into the rectum. This test alone may not identify an actual bleeding point. X-rays highlight abnormal shadows, such as those made by lumps (tumors), diverticuli, or colitis. TREATMENT  Treatment depends on the cause of your bleeding.   For bleeding from the stomach or colon, the caregiver doing your endoscopy or colonoscopy may be able to stop the bleeding as part of the procedure.  Inflammation or infection of the colon can be treated with medicines.  Many rectal problems can be treated with creams, suppositories, or warm baths.  Surgery is sometimes needed.  Blood transfusions are sometimes needed if you have lost a lot of blood.  For any bleeding problem, let your caregiver know if you take aspirin or other blood thinners regularly. HOME CARE INSTRUCTIONS   Take any medicines exactly as prescribed.  Keep your stools soft by eating a diet high in fiber. Prunes (1 to 3 a day) work well for many people.  Drink  enough water and fluids to keep your urine clear or pale yellow.  Take sitz baths if advised. A sitz bath is when you sit in a bathtub with warm water for 10 to 15 minutes to soak, soothe, and cleanse the rectal area.  If enemas or suppositories are advised, be sure you know how to use them. Tell your caregiver if you have problems with this.  Monitor your bowel movements to look for signs of improvement or worsening. SEEK MEDICAL CARE IF:   You do not improve in the time expected.  Your condition worsens after initial improvement.  You develop any new symptoms. SEEK IMMEDIATE MEDICAL CARE IF:   You develop severe or prolonged rectal bleeding.  You vomit blood.  You feel weak or faint.  You have a fever. MAKE SURE YOU:  Understand these instructions.  Will watch your condition.  Will get help right away if you are not doing well or get worse. Document Released: 09/27/2002 Document Revised: 12/30/2011 Document Reviewed: 02/22/2011 [facility] Patient Information 2015 [place], [place]. This information is not intended to replace advice given to you by your health care provider. Make sure you discuss any questions you have with your health care provider. Prednisone tablets What is this medicine? PREDNISONE (PRED ni sone) is a corticosteroid.  It is commonly used to treat inflammation of the skin, joints, lungs, and other organs. Common conditions treated include asthma, allergies, and arthritis. It is also used for other conditions, such as blood disorders and diseases of the adrenal glands. This medicine may be used for other purposes; ask your health care provider or pharmacist if you have questions. COMMON BRAND NAME(S): Deltasone, Predone, Sterapred, Sterapred DS What should I tell my health care provider before I take this medicine? They need to know if you have any of these conditions: -Cushing's syndrome -diabetes -glaucoma -heart disease -high blood pressure -infection  (especially a virus infection such as chickenpox, cold sores, or herpes) -kidney disease -liver disease -mental illness -myasthenia gravis -osteoporosis -seizures -stomach or intestine problems -thyroid disease -an unusual or allergic reaction to lactose, prednisone, other medicines, foods, dyes, or preservatives -pregnant or trying to get pregnant -breast-feeding How should I use this medicine? Take this medicine by mouth with a glass of water. Follow the directions on the prescription label. Take this medicine with food. If you are taking this medicine once a day, take it in the morning. Do not take more medicine than you are told to take. Do not suddenly stop taking your medicine because you may develop a severe reaction. Your doctor will tell you how much medicine to take. If your doctor wants you to stop the medicine, the dose may be slowly lowered over time to avoid any side effects. Talk to your pediatrician regarding the use of this medicine in children. Special care may be needed. Overdosage: If you think you have taken too much of this medicine contact a poison control center or emergency room at once. NOTE: This medicine is only for you. Do not share this medicine with others. What if I miss a dose? If you miss a dose, take it as soon as you can. If it is almost time for your next dose, talk to your doctor or health care professional. You may need to miss a dose or take an extra dose. Do not take double or extra doses without advice. What may interact with this medicine? Do not take this medicine with any of the following medications: -metyrapone -mifepristone This medicine may also interact with the following medications: -aminoglutethimide -amphotericin B -aspirin and aspirin-like medicines -barbiturates -certain medicines for diabetes, like glipizide or glyburide -cholestyramine -cholinesterase inhibitors -cyclosporine -digoxin -diuretics -ephedrine -male hormones,  like estrogens and birth control pills -isoniazid -ketoconazole -NSAIDS, medicines for pain and inflammation, like ibuprofen or naproxen -phenytoin -rifampin -toxoids -vaccines -warfarin This list may not describe all possible interactions. Give your health care provider a list of all the medicines, herbs, non-prescription drugs, or dietary supplements you use. Also tell them if you smoke, drink alcohol, or use illegal drugs. Some items may interact with your medicine. What should I watch for while using this medicine? Visit your doctor or health care professional for regular checks on your progress. If you are taking this medicine over a prolonged period, carry an identification card with your name and address, the type and dose of your medicine, and your doctor's name and address. This medicine may increase your risk of getting an infection. Tell your doctor or health care professional if you are around anyone with measles or chickenpox, or if you develop sores or blisters that do not heal properly. If you are going to have surgery, tell your doctor or health care professional that you have taken this medicine within the last twelve months. Ask your  doctor or health care professional about your diet. You may need to lower the amount of salt you eat. This medicine may affect blood sugar levels. If you have diabetes, check with your doctor or health care professional before you change your diet or the dose of your diabetic medicine. What side effects may I notice from receiving this medicine? Side effects that you should report to your doctor or health care professional as soon as possible: -allergic reactions like skin rash, itching or hives, swelling of the face, lips, or tongue -changes in emotions or moods -changes in vision -depressed mood -eye pain -fever or chills, cough, sore throat, pain or difficulty passing urine -increased thirst -swelling of ankles, feet Side effects that  usually do not require medical attention (report to your doctor or health care professional if they continue or are bothersome): -confusion, excitement, restlessness -headache -nausea, vomiting -skin problems, acne, thin and shiny skin -trouble sleeping -weight gain This list may not describe all possible side effects. Call your doctor for medical advice about side effects. You may report side effects to FDA at 1-800-FDA-1088. Where should I keep my medicine? Keep out of the reach of children. Store at room temperature between 15 and 30 degrees C (59 and 86 degrees F). Protect from light. Keep container tightly closed. Throw away any unused medicine after the expiration date. NOTE: This sheet is a summary. It may not cover all possible information. If you have questions about this medicine, talk to your doctor, pharmacist, or health care provider.  2015, [employer]. (2011-05-23 10:57:14) Mesalamine, 5-ASA extended-release capsules (Apriso) What is this medicine? MESALAMINE (me SAL a meen) is used to maintain remission of ulcerative colitis. This medicine may be used for other purposes; ask your health care provider or pharmacist if you have questions. COMMON BRAND NAME(S): Apriso What should I tell my health care provider before I take this medicine? They need to know if you have any of these conditions: -kidney disease -liver disease -phenylketonuria -pyloric stenosis -an unusual or allergic reaction to mesalamine, salicylates, other medicines, foods, dyes, or preservatives -pregnant or trying to get pregnant -breast-feeding How should I use this medicine? Take this medicine by mouth with a glass of water. Follow the directions on the prescription label. Do not cut, crush, or chew this medicine. Take your medicine at regular intervals. Do not take it more often than directed. Do not stop taking except on your doctor's advice. Talk to your pediatrician regarding the use of  this medicine in children. Special care may be needed. Overdosage: If you think you have taken too much of this medicine contact a poison control center or emergency room at once. NOTE: This medicine is only for you. Do not share this medicine with others. What if I miss a dose? If you miss a dose, take it as soon as you can. If it is almost time for your next dose, take only that dose. Do not take double or extra doses. What may interact with this medicine? -antacids This list may not describe all possible interactions. Give your health care provider a list of all the medicines, herbs, non-prescription drugs, or dietary supplements you use. Also tell them if you smoke, drink alcohol, or use illegal drugs. Some items may interact with your medicine. What should I watch for while using this medicine? Tell your doctor or healthcare professional if your symptoms get worse. Do not change the brand of this medicine that you are taking without talking to your  doctor or health care professional. All brands do not have the same dose. What side effects may I notice from receiving this medicine? Side effects that you should report to your doctor or health care professional as soon as possible: -allergic reactions like skin rash, itching or hives, swelling of the face, lips, or tongue -bloody diarrhea -chest pain -difficulty breathing, wheezing -fever -pain or difficulty passing urine -unusually weak or tired -yellowing of the eyes or skin Side effects that usually do not require medical attention (report to your doctor or health care professional if they continue or are bothersome): -headache -nausea, vomiting -stomach gas -stomach pain or cramps This list may not describe all possible side effects. Call your doctor for medical advice about side effects. You may report side effects to FDA at 1-800-FDA-1088. Where should I keep my medicine? Keep out of the reach of children. Store at room  temperature between 20 and 25 degrees C (68 and 77 degrees F). Throw away any unused medicine after the expiration date. NOTE: This sheet is a summary. It may not cover all possible information. If you have questions about this medicine, talk to your doctor, pharmacist, or health care provider.  2015, [employer]. (2007-12-18 17:04:00)

## 2015-07-12 NOTE — Discharge Summary (Addendum)
Physician Discharge Summary  John [name] XHB:[id number] DOB: February 08, 1981 DOA: 07/11/2015  PCP: Merri Ray DAVID, MD  Admit date: 07/11/2015 Discharge date: 07/12/2015  Recommendations for Outpatient Follow-up:  1. Take prednisone 40 mg daily for 2 weeks on discharge, through 07/26/2015 and continue 30 mg daily. Please follow-up with GI per scheduled appointment. They will taper prednisone. Spoke with the pt about potential side effects of prednisone and mesalamine. Also provided written info on side effects.  Discharge Diagnoses:  Principal Problem:   Bloody diarrhea Active Problems:   Leukocytosis   Hypothyroidism   Acute blood loss anemia   Hypokalemia   Hyponatremia   Discharge Condition: stable   Diet recommendation: as tolerated   History of present illness:  34 year old male with past medical history of hypothyroidism, has had C. difficile infection about few months ago and was treated with Flagyl and because of persistent diarrhea he was changed to vancomycin. He subsequently has gotten better although stool was never fully formed. He recently had oral surgery and was placed on clindamycin and then he noted his diarrhea worsened. Patient reported blood in the stool but no pus. He has had quite a few episodes of diarrhea prior to this admission. He was seen in urgent care center 4 days prior to this admission and was placed back on Flagyl. His stool was sent for C. difficile analysis and it was negative.   In ED, patient was hemodynamically stable. Blood work was notable for white blood cell count of 17.3, hemoglobin 10.9, sodium 134, potassium 3.1. GI has seen the patient in consultation and plans for endoscopy and flexible sigmoidoscopy today.  Hospital Course:   Assessment & Plan    Principal Problem:  Bloody diarrhea / Acute blood loss anemia / Inflammatory bowel disease  - Patient presents with bloody diarrhea.  Stool for C. difficile was negative on recent  studies. GI pathogen panel pending. Stool culture re incubated for better growth. - Patient underwent upper endoscopy 07/11/2015 with findings of gastritis. Continue zantac per GI on discharge.  He also underwent flexible sigmoidoscopy the same day and findings were consistent with moderate to severe colitis from the rectum to distal descending colon most consistent with inflammatory bowel disease. Biopsies were obtained and will be followed up by GI on outpatient basis. - Patient will continue prednisone on discharge as noted above, 40 mg daily for 2 weeks and then 30 mg daily until seen by GI office who will taper prednisone thereafter.   Active Problems:  Leukocytosis - Persistent leukocytosis. Most likely exacerbated by steroids.  - Patient was initially on Flagyl on the admission for possible C. Difficile. Flagyl stopped. - GI pathogen panel pending and stool culture re incubated for better growth.   Hypothyroidism - Resume Synthroid. - TSH within normal limits    Hypokalemia - Secondary to GI losses.  - Supplemented and within normal limits   Hyponatremia - Likely prerenal, secondary to GI losses.  - Normalized with fluids   DVT prophylaxis:  - SCD's bilaterally in hospital  Signed:  Leisa Lenz, MD  [facility] 07/12/2015, 10:07 AM  Pager #: [phone]  Time spent in minutes: less than 30 minutes   Discharge Exam: Filed Vitals:   07/12/15 0614  BP: 129/73  Pulse: 70  Temp: 98.5 F (36.9 C)  Resp: 20   Filed Vitals:   07/11/15 1636 07/11/15 1855 07/11/15 2159 07/12/15 0614  BP: 128/62 139/72 128/70 129/73  Pulse: 89 106 85 70  Temp: 98.7 F (  37.1 C) 102.5 F (39.2 C) 99.5 F (37.5 C) 98.5 F (36.9 C)  TempSrc: Oral Oral Oral Oral  Resp: 20 20 20 20   Height:      Weight:      SpO2: 95% 95% 93% 99%    General: Pt is alert, follows commands appropriately, not in acute distress Cardiovascular: Regular rate and rhythm, S1/S2 +, no  murmurs Respiratory: Clear to auscultation bilaterally, no wheezing, no crackles, no rhonchi Abdominal: Soft, non tender, non distended, bowel sounds +, no guarding Extremities: no edema, no cyanosis, pulses palpable bilaterally DP and PT Neuro: Grossly nonfocal  Discharge Instructions  Discharge Instructions    Call MD for:  difficulty breathing, headache or visual disturbances    Complete by:  As directed      Call MD for:  persistant nausea and vomiting    Complete by:  As directed      Call MD for:  severe uncontrolled pain    Complete by:  As directed       Diet - low sodium heart healthy    Complete by:  As directed      Discharge instructions    Complete by:  As directed   1. Take prednisone 40 mg daily for 2 weeks on discharge, through 07/26/2015. And continue 30 mg daily. Please follow-up with GI per scheduled appointment. They will taper prednisone.     Increase activity slowly    Complete by:  As directed             Medication List    STOP taking these medications        metroNIDAZOLE 500 MG tablet  Commonly known as:  FLAGYL      TAKE these medications        acetaminophen 500 MG tablet  Commonly known as:  TYLENOL  Take 1,000 mg by mouth every 6 (six) hours as needed.     HYDROcodone-acetaminophen 5-325 MG per tablet  Commonly known as:  NORCO  Take 1 tablet by mouth every 4 (four) hours as needed.     LEVOTHROID 25 MCG tablet  Generic drug:  levothyroxine  Take 1 tablet (25 mcg total) by mouth daily before breakfast.     mesalamine 1.2 G EC tablet  Commonly known as:  LIALDA  Take 4 tablets (4.8 g total) by mouth daily with breakfast.     predniSONE 10 MG tablet  Commonly known as:  DELTASONE  Take 4 tablets (40 mg total) by mouth daily with breakfast.     ranitidine 75 MG tablet  Commonly known as:  ZANTAC 75  Take 1 tablet (75 mg total) by mouth 2 (two) times daily.            Follow-up Information    Follow up with Carlota Raspberry, JEFFREY  DAVID, MD. Schedule an appointment as soon as possible for a visit in 1 week.   Specialty:  Pediatrics   Why:  Follow up appt after recent hospitalization   Contact information:   [address] [place] [address] [phone]        The results of significant diagnostics from this hospitalization (including imaging, microbiology, ancillary and laboratory) are listed below for reference.    Significant Diagnostic Studies: No results found.  Microbiology: Recent Results (from the past 240 hour(s))  Clostridium Difficile by PCR     Status: None   Collection Time: 07/06/15 11:05 AM  Result Value Ref Range Status   Toxigenic C Difficile  by pcr Not Detected Not Detected Final    Comment: This test is for use only with liquid or soft stools; performance characteristics of other clinical specimen types have not been established.   This assay was performed by Cepheid GeneXpert(R) PCR. The performance characteristics of this assay have been determined by [employer]. Performance characteristics refer to the analytical performance of the test.   Clostridium Difficile by PCR     Status: None   Collection Time: 07/08/15 11:14 AM  Result Value Ref Range Status   Toxigenic C Difficile by pcr Not Detected Not Detected Final    Comment: This test is for use only with liquid or soft stools; performance characteristics of other clinical specimen types have not been established.   This assay was performed by Cepheid GeneXpert(R) PCR. The performance characteristics of this assay have been determined by [employer]. Performance characteristics refer to the analytical performance of the test.   Stool culture     Status: None (Preliminary result)   Collection Time: 07/11/15  8:01 AM  Result Value Ref Range Status   Specimen Description STOOL  Final   Special Requests NONE  Final   Culture   Final    Culture reincubated for better growth Performed at [facility]    Report Status PENDING  Incomplete     Labs: Basic Metabolic Panel:  Recent Labs Lab 07/11/15 0441 07/11/15 1015 07/12/15 0543  NA 134* 136 137  K 3.1* 3.4* 3.9  CL 97* 99* 101  CO2 24 25 26   GLUCOSE 117* 102* 140*  BUN 11 11 13   CREATININE 0.87 0.94 0.84  CALCIUM 8.5* 8.7* 8.9  MG  --  2.0  --   PHOS  --  3.6  --    Liver Function Tests:  Recent Labs Lab 07/11/15 0441  AST 19  ALT 27  ALKPHOS 73  BILITOT 0.5  PROT 8.0  ALBUMIN 3.4*   No results for input(s): LIPASE, AMYLASE in the last 168 hours. No results for input(s): AMMONIA in the last 168 hours. CBC:  Recent Labs Lab 07/08/15 1041 07/11/15 0441 07/11/15 1015 07/12/15 0543  WBC 12.9* 17.3* 15.8* 16.9*  NEUTROABS  --   --  12.2*  --   HGB 11.1* 10.9* 10.7* 10.5*  HCT 35.6* 33.0* 32.6* 32.1*  MCV 76.3* 77.1* 77.1* 77.5*  PLT  --  433* 432* 449*   Cardiac Enzymes: No results for input(s): CKTOTAL, CKMB, CKMBINDEX, TROPONINI in the last 168 hours. BNP: BNP (last 3 results) No results for input(s): BNP in the last 8760 hours.  ProBNP (last 3 results) No results for input(s): PROBNP in the last 8760 hours.  CBG:  Recent Labs Lab 07/12/15 0746  GLUCAP 108*

## 2015-07-13 LAB — GI PATHOGEN PANEL BY PCR, STOOL
C difficile toxin A/B: NOT DETECTED
Campylobacter by PCR: NOT DETECTED
Cryptosporidium by PCR: NOT DETECTED
E coli (ETEC) LT/ST: NOT DETECTED
E coli (STEC): NOT DETECTED
E coli 0157 by PCR: NOT DETECTED
G lamblia by PCR: NOT DETECTED
Norovirus GI/GII: NOT DETECTED
Rotavirus A by PCR: NOT DETECTED
Salmonella by PCR: NOT DETECTED
Shigella by PCR: NOT DETECTED

## 2015-07-13 LAB — HIGH SENSITIVITY CRP: CRP, High Sensitivity: 209.91 mg/L — ABNORMAL HIGH (ref 0.00–3.00)

## 2015-07-14 ENCOUNTER — Telehealth: Payer: Self-pay

## 2015-07-14 LAB — STOOL CULTURE

## 2015-07-14 NOTE — Progress Notes (Signed)
Quick Note:  Week of Oct 10 ______

## 2015-07-14 NOTE — Telephone Encounter (Signed)
Patient's wife reported patient with urgent diarrhea again this am.  Stools about once an hour.  Currently on 40 mg of prednisone.

## 2015-07-14 NOTE — Telephone Encounter (Signed)
Ok.. Spoke with Carla Drape, he will increase prednisone to 60 mg po qam, restart Bentyl 10 mg 3-4 times daily and may also [place] lomotil 3-4 x daily as needed.  Out of work next week - may need to see him next week if not making any progress Stool studies were all negative

## 2015-07-16 LAB — CULTURE, BLOOD (ROUTINE X 2)
Culture: NO GROWTH
Culture: NO GROWTH

## 2015-07-17 ENCOUNTER — Inpatient Hospital Stay (HOSPITAL_COMMUNITY): Payer: Managed Care, Other (non HMO)

## 2015-07-17 ENCOUNTER — Encounter (HOSPITAL_COMMUNITY): Payer: Self-pay | Admitting: Radiology

## 2015-07-17 ENCOUNTER — Inpatient Hospital Stay (HOSPITAL_COMMUNITY)
Admission: AD | Admit: 2015-07-17 | Discharge: 2015-07-26 | DRG: 348 | Disposition: A | Discharge status: Home / Self Care | Payer: Managed Care, Other (non HMO) | Source: Ambulatory Visit | Attending: Gastroenterology | Admitting: Gastroenterology

## 2015-07-17 DIAGNOSIS — R198 Other specified symptoms and signs involving the digestive system and abdomen: Secondary | ICD-10-CM | POA: Diagnosis present

## 2015-07-17 DIAGNOSIS — R1033 Periumbilical pain: Secondary | ICD-10-CM | POA: Diagnosis not present

## 2015-07-17 DIAGNOSIS — E871 Hypo-osmolality and hyponatremia: Secondary | ICD-10-CM | POA: Diagnosis present

## 2015-07-17 DIAGNOSIS — K644 Residual hemorrhoidal skin tags: Secondary | ICD-10-CM | POA: Diagnosis present

## 2015-07-17 DIAGNOSIS — Z79891 Long term (current) use of opiate analgesic: Secondary | ICD-10-CM | POA: Diagnosis not present

## 2015-07-17 DIAGNOSIS — R1030 Lower abdominal pain, unspecified: Secondary | ICD-10-CM | POA: Diagnosis not present

## 2015-07-17 DIAGNOSIS — R109 Unspecified abdominal pain: Secondary | ICD-10-CM

## 2015-07-17 DIAGNOSIS — K51918 Ulcerative colitis, unspecified with other complication: Secondary | ICD-10-CM

## 2015-07-17 DIAGNOSIS — R140 Abdominal distension (gaseous): Secondary | ICD-10-CM

## 2015-07-17 DIAGNOSIS — Z800 Family history of malignant neoplasm of digestive organs: Secondary | ICD-10-CM | POA: Diagnosis not present

## 2015-07-17 DIAGNOSIS — K5190 Ulcerative colitis, unspecified, without complications: Secondary | ICD-10-CM

## 2015-07-17 DIAGNOSIS — K51314 Ulcerative (chronic) rectosigmoiditis with abscess: Secondary | ICD-10-CM | POA: Diagnosis present

## 2015-07-17 DIAGNOSIS — T380X5A Adverse effect of glucocorticoids and synthetic analogues, initial encounter: Secondary | ICD-10-CM | POA: Diagnosis present

## 2015-07-17 DIAGNOSIS — Z7952 Long term (current) use of systemic steroids: Secondary | ICD-10-CM

## 2015-07-17 DIAGNOSIS — R7612 Nonspecific reaction to cell mediated immunity measurement of gamma interferon antigen response without active tuberculosis: Secondary | ICD-10-CM

## 2015-07-17 DIAGNOSIS — R197 Diarrhea, unspecified: Secondary | ICD-10-CM | POA: Diagnosis not present

## 2015-07-17 DIAGNOSIS — R7989 Other specified abnormal findings of blood chemistry: Secondary | ICD-10-CM | POA: Diagnosis present

## 2015-07-17 DIAGNOSIS — K210 Gastro-esophageal reflux disease with esophagitis: Secondary | ICD-10-CM | POA: Diagnosis present

## 2015-07-17 DIAGNOSIS — K2970 Gastritis, unspecified, without bleeding: Secondary | ICD-10-CM | POA: Diagnosis present

## 2015-07-17 DIAGNOSIS — R319 Hematuria, unspecified: Secondary | ICD-10-CM | POA: Diagnosis present

## 2015-07-17 DIAGNOSIS — Z79899 Other long term (current) drug therapy: Secondary | ICD-10-CM | POA: Diagnosis not present

## 2015-07-17 DIAGNOSIS — K51018 Ulcerative (chronic) pancolitis with other complication: Secondary | ICD-10-CM | POA: Diagnosis not present

## 2015-07-17 DIAGNOSIS — E039 Hypothyroidism, unspecified: Secondary | ICD-10-CM | POA: Diagnosis present

## 2015-07-17 DIAGNOSIS — R945 Abnormal results of liver function studies: Secondary | ICD-10-CM

## 2015-07-17 DIAGNOSIS — K6289 Other specified diseases of anus and rectum: Secondary | ICD-10-CM

## 2015-07-17 DIAGNOSIS — Z8249 Family history of ischemic heart disease and other diseases of the circulatory system: Secondary | ICD-10-CM

## 2015-07-17 DIAGNOSIS — Z833 Family history of diabetes mellitus: Secondary | ICD-10-CM

## 2015-07-17 DIAGNOSIS — K449 Diaphragmatic hernia without obstruction or gangrene: Secondary | ICD-10-CM | POA: Diagnosis present

## 2015-07-17 DIAGNOSIS — K760 Fatty (change of) liver, not elsewhere classified: Secondary | ICD-10-CM | POA: Diagnosis present

## 2015-07-17 DIAGNOSIS — K610 Anal abscess: Secondary | ICD-10-CM | POA: Diagnosis present

## 2015-07-17 DIAGNOSIS — R110 Nausea: Secondary | ICD-10-CM

## 2015-07-17 LAB — COMPREHENSIVE METABOLIC PANEL
ALT: 94 U/L — ABNORMAL HIGH (ref 17–63)
AST: 55 U/L — ABNORMAL HIGH (ref 15–41)
Albumin: 3.1 g/dL — ABNORMAL LOW (ref 3.5–5.0)
Alkaline Phosphatase: 138 U/L — ABNORMAL HIGH (ref 38–126)
Anion gap: 9 (ref 5–15)
BUN: 14 mg/dL (ref 6–20)
CO2: 25 mmol/L (ref 22–32)
Calcium: 8.6 mg/dL — ABNORMAL LOW (ref 8.9–10.3)
Chloride: 96 mmol/L — ABNORMAL LOW (ref 101–111)
Creatinine, Ser: 0.78 mg/dL (ref 0.61–1.24)
GFR calc Af Amer: >60 mL/min (ref >60)
GFR calc non Af Amer: >60 mL/min (ref >60)
Glucose, Bld: 115 mg/dL — ABNORMAL HIGH (ref 65–99)
Potassium: 4.4 mmol/L (ref 3.5–5.1)
Sodium: 130 mmol/L — ABNORMAL LOW (ref 135–145)
Total Bilirubin: 1 mg/dL (ref 0.3–1.2)
Total Protein: 7.8 g/dL (ref 6.5–8.1)

## 2015-07-17 LAB — CBC
HCT: 34.6 % — ABNORMAL LOW (ref 39.0–52.0)
Hemoglobin: 11.2 g/dL — ABNORMAL LOW (ref 13.0–17.0)
MCH: 24.9 pg — ABNORMAL LOW (ref 26.0–34.0)
MCHC: 32.4 g/dL (ref 30.0–36.0)
MCV: 76.9 fL — ABNORMAL LOW (ref 78.0–100.0)
Platelets: 598 10*3/uL — ABNORMAL HIGH (ref 150–400)
RBC: 4.5 MIL/uL (ref 4.22–5.81)
RDW: 14.6 % (ref 11.5–15.5)
WBC: 21.6 10*3/uL — ABNORMAL HIGH (ref 4.0–10.5)

## 2015-07-17 LAB — C DIFFICILE QUICK SCREEN W PCR REFLEX
C Diff antigen: NEGATIVE
C Diff interpretation: NEGATIVE
C Diff toxin: NEGATIVE

## 2015-07-17 LAB — PROTIME-INR
INR: 1.16 (ref 0.00–1.49)
Prothrombin Time: 15 seconds (ref 11.6–15.2)

## 2015-07-17 MED ORDER — METHYLPREDNISOLONE SODIUM SUCC 40 MG IJ SOLR
30.0000 mg | Freq: Two times a day (BID) | INTRAMUSCULAR | Status: DC
  Administered 2015-07-17 – 2015-07-22 (×11): 30 mg via INTRAVENOUS
  Filled 2015-07-17 (×13): qty 0.75

## 2015-07-17 MED ORDER — HYDROCODONE-ACETAMINOPHEN 5-325 MG PO TABS
1.0000 | ORAL_TABLET | ORAL | Status: DC | PRN
  Administered 2015-07-18 – 2015-07-24 (×8): 2 via ORAL
  Filled 2015-07-17 (×8): qty 2

## 2015-07-17 MED ORDER — LEVOTHYROXINE SODIUM 25 MCG PO TABS
25.0000 ug | ORAL_TABLET | Freq: Every day | ORAL | Status: DC
  Administered 2015-07-18 – 2015-07-26 (×9): 25 ug via ORAL
  Filled 2015-07-17 (×10): qty 1

## 2015-07-17 MED ORDER — ONDANSETRON HCL 4 MG PO TABS: 4.0000 mg | ORAL_TABLET | Freq: Four times a day (QID) | ORAL | Status: DC | PRN

## 2015-07-17 MED ORDER — SODIUM CHLORIDE 0.9 % IV SOLN: 250.0000 mL | INTRAVENOUS | Status: DC | PRN

## 2015-07-17 MED ORDER — SODIUM CHLORIDE 0.9 % IJ SOLN
3.0000 mL | Freq: Two times a day (BID) | INTRAMUSCULAR | Status: DC
  Administered 2015-07-17 – 2015-07-26 (×9): 3 mL via INTRAVENOUS

## 2015-07-17 MED ORDER — METHYLPREDNISOLONE SODIUM SUCC 125 MG IJ SOLR
60.0000 mg | Freq: Two times a day (BID) | INTRAMUSCULAR | Status: DC
  Filled 2015-07-17 (×2): qty 0.96

## 2015-07-17 MED ORDER — HYDROMORPHONE HCL 1 MG/ML IJ SOLN
1.0000 mg | INTRAMUSCULAR | Status: DC | PRN
  Administered 2015-07-17 – 2015-07-22 (×17): 1 mg via INTRAVENOUS
  Filled 2015-07-17 (×18): qty 1

## 2015-07-17 MED ORDER — FAMOTIDINE 20 MG PO TABS
20.0000 mg | ORAL_TABLET | Freq: Two times a day (BID) | ORAL | Status: DC
  Administered 2015-07-17 – 2015-07-26 (×18): 20 mg via ORAL
  Filled 2015-07-17 (×21): qty 1

## 2015-07-17 MED ORDER — ACETAMINOPHEN 325 MG PO TABS: 650.0000 mg | ORAL_TABLET | Freq: Four times a day (QID) | ORAL | Status: DC | PRN

## 2015-07-17 MED ORDER — ACETAMINOPHEN 650 MG RE SUPP: 650.0000 mg | Freq: Four times a day (QID) | RECTAL | Status: DC | PRN

## 2015-07-17 MED ORDER — ALUM & MAG HYDROXIDE-SIMETH 200-200-20 MG/5ML PO SUSP: 30.0000 mL | Freq: Four times a day (QID) | ORAL | Status: DC | PRN

## 2015-07-17 MED ORDER — SODIUM CHLORIDE 0.9 % IV SOLN
250.0000 mL | INTRAVENOUS | Status: DC | PRN
  Administered 2015-07-17: 250 mL via INTRAVENOUS

## 2015-07-17 MED ORDER — SODIUM CHLORIDE 0.9 % IJ SOLN
3.0000 mL | INTRAMUSCULAR | Status: DC | PRN
  Administered 2015-07-21 – 2015-07-22 (×2): 3 mL via INTRAVENOUS
  Filled 2015-07-17 (×2): qty 3

## 2015-07-17 MED ORDER — IOHEXOL 300 MG/ML  SOLN
25.0000 mL | INTRAMUSCULAR | Status: AC
  Administered 2015-07-17 (×2): 25 mL via ORAL

## 2015-07-17 MED ORDER — ONDANSETRON HCL 4 MG/2ML IJ SOLN
4.0000 mg | Freq: Four times a day (QID) | INTRAMUSCULAR | Status: DC | PRN
  Filled 2015-07-17: qty 2

## 2015-07-17 MED ORDER — IOHEXOL 300 MG/ML  SOLN
100.0000 mL | Freq: Once | INTRAMUSCULAR | Status: AC | PRN
  Administered 2015-07-17: 100 mL via INTRAVENOUS

## 2015-07-17 NOTE — H&P (Signed)
[facility] Admission Note   Primary Care Physician:  Merri Ray DAVID, MD Primary Gastroenterologist:  Dr. Carlean Purl  CHIEF COMPLAINT:  Diarrhea, abdominal pain  HPI: John [name] is a 34 y.o. male who is known to Dr. Carlean Purl. John had a colonoscopy in January 2016 for screening purposes due to a family history of colon cancer in the patient's father. The colonoscopy was a negative exam except for small internal hemorrhoids. John was then evaluated in the office in June 2016 with a one-month history of diarrhea with 3-4 bowel movements daily. At that time he was found to be C. difficile positive. He was treated initially with Flagyl but subsequently required vancomycin due to persistent symptoms. He has never returned to his baseline and has continued having intermittent mushy to loose stools several times daily. He was admitted to the hospital September 20 with diarrhea. Unfortunately, he is being treated with clindamycin for dental surgery 2 weeks prior to admission. After taking clindamycin he developed diarrhea which became bloody. He having bowel movements every 2-3 hours associated with lower abdominal pain. He also had a temp of 103. He also had hematemesis blood streaking which was felt to likely be due to retching or mild esophagitis. He had an EGD and flexible sigmoidoscopy on 07/11/2015. EGD: ENDOSCOPIC IMPRESSION: 1. Reflux esophagitis at the gastroesophageal junction 2. Small hiatal hernia 3. Retch gastritis (inflammation) was found in the cardia 4. The stomach otherwise appeared normal 5. The duodenal mucosa showed no abnormalities in the bulb and 2nd part of the duodenum RECOMMENDATIONS: 1. Zantac 75 mg or Pepcid 20  Flex sig:  ENDOSCOPIC IMPRESSION: Moderate to severe colitis from the rectum to distal descending colon, most consistent with IBD; multiple biopsies obtained Page 1 of 2 RECOMMENDATIONS: 1. Begin  methylprednisolone 40 mg IV 2. Begin Lialda 4.8 g daily 3. Avoid NSAIDs 4. Office follow-up with Dr. Carlean Purl Pathology:Diagnosis Colon, biopsy, sigmoid - MODERATELY ACTIVE CHRONIC COLITIS, SEE COMMENT. - NEGATIVE FOR DYSPLASIA. Microscopic Comment The morphologic features are that of idiopathic inflammatory bowel disease. The chronic mucosal injury appears diffuse within each of the biopsies submitted; consistent with an ulcerative colitis pattern of mucosal injury. A CMV immunostain is pending and will be reported in an addendum. (CRR:ecj 07/12/2015)  While in the hospital stool for C. difficile was negative. GI pathogen panel was negative. He was discharged home on September 21 on 40 mg of prednisone daily for 2 weeks and then 30 mg daily until seen by GI. He contacted the office on the 23rd with worsening diarrhea and was instructed to increase his prednisone to 60 mg daily. He was also started on Bentyl 10 mg 3-4 times daily and Lomotil 3-4 times daily as needed. Over the weekend he was having a bowel movement every hour. Yesterday he took Lomotil and Bentyl which he says temporarily decreased his diarrhea but caused him to have severe abdominal pain and cramping. He felt nauseous throughout the weekend but did not vomit. He called the office today and was advised to come to the hospital for admission. He has had intermittent blood in his stools. He has had chills. He reports that yesterday and today he has some burning on urination and saw some blood in his urine as well. He had been taking Lialda at home but it did not decrease his diarrhea.  Other pertinent past history includes hypothyroidism.  Past Medical History  Diagnosis Date  . GERD (gastroesophageal reflux disease)   . 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 Esophageal reflux 06/08/2014  . Hemorrhoids, internal, with bleeding 06/08/2014    Past Surgical History  Procedure Laterality Date  . Knee arthroscopy Left   .  Esophagogastroduodenoscopy N/A 07/11/2015    Procedure: ESOPHAGOGASTRODUODENOSCOPY (EGD);  Surgeon: Jerene Bears, MD;  Location: [facility];  Service: Gastroenterology;  Laterality: N/A;  . Flexible sigmoidoscopy N/A 07/11/2015    Procedure: FLEXIBLE SIGMOIDOSCOPY;  Surgeon: Jerene Bears, MD;  Location: [facility];  Service: Gastroenterology;  Laterality: N/A;    Prior to Admission medications   Medication Sig Start Date End Date Taking? Authorizing Provider  acetaminophen (TYLENOL) 500 MG tablet Take 1,000 mg by mouth every 6 (six) hours as needed.    Historical Provider, MD  HYDROcodone-acetaminophen (NORCO) 5-325 MG per tablet Take 1 tablet by mouth every 4 (four) hours as needed. 07/08/15   Posey Boyer, MD  LEVOTHROID 25 MCG tablet Take 1 tablet (25 mcg total) by mouth daily before breakfast. 04/12/15   Mancel Bale, PA-C  mesalamine (LIALDA) 1.2 G EC tablet Take 4 tablets (4.8 g total) by mouth daily with breakfast. 07/12/15   Robbie Lis, MD  predniSONE (DELTASONE) 10 MG tablet Take 4 tablets (40 mg total) by mouth daily with breakfast. 07/12/15 08/02/15  Robbie Lis, MD  ranitidine (ZANTAC 75) 75 MG tablet Take 1 tablet (75 mg total) by mouth 2 (two) times daily. 07/12/15   Robbie Lis, MD    Current Facility-Administered Medications  Medication Dose Route Frequency Provider Last Rate Last Dose  . 0.9 %  sodium chloride infusion  250 mL Intravenous PRN Lori P Hvozdovic, PA-C      . acetaminophen (TYLENOL) tablet 650 mg  650 mg Oral Q6H PRN Lori P Hvozdovic, PA-C       Or  . acetaminophen (TYLENOL) suppository 650 mg  650 mg Rectal Q6H PRN Lori P Hvozdovic, PA-C      . alum & mag hydroxide-simeth (MAALOX/MYLANTA) 200-200-20 MG/5ML suspension 30 mL  30 mL Oral Q6H PRN Lori P Hvozdovic, PA-C      . HYDROcodone-acetaminophen (NORCO/VICODIN) 5-325 MG per tablet 1-2 tablet  1-2 tablet Oral Q4H PRN Lori P Hvozdovic, PA-C      . HYDROmorphone (DILAUDID) injection 1 mg  1 mg Intravenous  Q4H PRN Lori P Hvozdovic, PA-C      . iohexol (OMNIPAQUE) 300 MG/ML solution 25 mL  25 mL Oral Q1 Hr x 2 Medication Radiologist, MD   25 mL at 07/17/15 1052  . methylPREDNISolone sodium succinate (SOLU-MEDROL) 40 mg/mL injection 30 mg  30 mg Intravenous Q12H Lori P Hvozdovic, PA-C      . ondansetron (ZOFRAN) tablet 4 mg  4 mg Oral Q6H PRN Lori P Hvozdovic, PA-C       Or  . ondansetron (ZOFRAN) injection 4 mg  4 mg Intravenous Q6H PRN Lori P Hvozdovic, PA-C      . sodium chloride 0.9 % injection 3 mL  3 mL Intravenous Q12H Lori P Hvozdovic, PA-C      . sodium chloride 0.9 % injection 3 mL  3 mL Intravenous PRN Lori P Hvozdovic, PA-C        Allergies as of 07/17/2015 - Review Complete 07/11/2015  Allergen Reaction Noted  . Ibuprofen  06/29/2015    Family History  Problem Relation Age of Onset  . Diabetes Father   . Colon cancer Father 69  . Heart disease Father   . Hyperlipidemia Father   .  Heart disease Mother   . Hyperlipidemia Mother   . Heart disease Brother   . Heart disease Maternal Grandmother   . Heart disease Maternal Grandfather   . Mental illness Maternal Grandfather   . Heart disease Paternal Grandmother   . Heart disease Paternal Grandfather   . Colon cancer Paternal Grandfather     Social History   Social History  . Marital Status: Married    Spouse Name: N/A  . Number of Children: 2  . Years of Education: N/A   Occupational History  . Diesel Statistician   Social History Main Topics  . Smoking status: Never Smoker   . Smokeless tobacco: Never Used  . Alcohol Use: No  . Drug Use: No  . Sexual Activity: Yes   Other Topics Concern  . Not on file   Social History Narrative   Married (Dottie Newport CMA [facility]), 1 daughter born 2014   Diesel Equities trader   Enjoys softball    REVIEW OF SYSTEMS: Constitutional: No fever or night sweats but has had chills ENT:  No nosebleeds, no hearing loss, no sore throat Pulm:  No cough, no  SOB CV: no chest pain, non palpitations GU:  Admits to dysuria and streaks of blood in his urine GI: Complains of nausea. No vomiting. Has had bloody diarrhea. Heme:  No abnormal bleeding or bruising Transfusions:  None Neuro:  No headazches, dizziness, weakness Derm:  No rashes Endocrine:  History hypothyroidism    PHYSICAL EXAM:  Temp:  [98.8 F (37.1 C)] 98.8 F (37.1 C) (09/26 1005) Pulse Rate:  [94] 94 (09/26 1005) Resp:  [16] 16 (09/26 1005) BP: (130)/(75) 130/75 mmHg (09/26 1005) SpO2:  [99 %] 99 % (09/26 1005) Last BM Date: 07/17/15  General:  Alert, oriented, pale Caucasian male in no apparent distress Ears:  No obvious external abnormalities Mouth:  Mucosa dry, no oral lesions Neck: Soft, supple, full range of motion, no cervical adenopathy Lungs:  Clear to auscultation  Heart:  Regular rate and rhythm Abdomen:  Soft, mild tenderness to palpation left side of abdomen, no rebound or guarding, positive bowel sounds, no organomegaly.    Rectal:  Not done-bloody stool per patient Msk:  Symmetrical without gross deformities Pulses:  Normal pulses noted Extremities:  Without cyanosis, clubbing, or edema. Neurologic: Alert and oriented 4, grossly normal neurologically. Skin:  Intact without significant lesions or rashes. Psych:  Alert and cooperative. Normal mood and affect.    IMPRESSION/PLAN:  #1. Ulcerative colitis. John was diagnosed with C. difficile in June 2016 and treated with metronidazole and then vancomycin. He subsequently had recurrence of diarrhea after a course of clindamycin. He was admitted last month and flexible sigmoidoscopy revealed biopsy confirmed ulcerative colitis. He has failed to respond to oral steroids and mesalamine. He is admitted at this time for IV fluids and Solu-Medrol. We will  hold mesalamine at this time as it may be exacerbating his diarrhea. A repeat C. difficile PCR in GI pathogen panel will be obtained. CBC, comprehensive metabolic  panel will be obtained. A TB quantiferon antihepatitis studies will be obtained in anticipation of possible biologic therapy in the near future. CT of the abdomen/pelvis will be obtained as well to further evaluate his diarrhea and abdominal pain. Clear liquids for now. Antiemetics as needed. Will avoid Lomotil and anticholinergisc at this time as they seem to exacerbate his abdominal pain.  #2. Dysuria. A urinalysis and urine culture will be obtained.  #3.  Hypothyroidism. Patient will continue his current dose of Levothroid    LOS: 0 days   Hvozdovic, Vita Barley  07/17/2015, 11:34 AM Pager [phone]    Attending physician's note   I have taken a history, examined the patient and reviewed the chart. I agree with the Advanced Practitioner's note, impression and recommendations.  Ulcerative colitis diagnosed on flex sig last week during hospitalization. Extent of colonic involvement is not known but is at least to descending colon by flex sig. Biopsies showed moderately active chronic colitis and CMV was negative. History of C diff 3 months ago and repeat C diff last week was negative. He is readmitted today with worsening lower and periumbilical abdominal pain, fever, diarrhea, weakness.  Will discontinue antidiarrheals and antispasmotics for now as his symptoms appeared to have worsened with these medications yesterday. Hold 5-ASA medications for now as well in case Lialda was exacerbating his diarrhea. GI pathogen panel. Order prebiologic testing. Consider Remicade. CT scan of abd/pelvis today given abdominal pain, fever on corticosteroids-R/O worsening colitis, perforation, etc. IV solumedrol, pain control and bowel rest for now. Management plans reviewed with Drs. Pyrtle and Gessner.   Ladene Artist, MD FACG [phone] Mon-Fri 8a-5p [phone] Mon-Fri 5p-8a, weekends, holidays or per [facility]

## 2015-07-18 ENCOUNTER — Inpatient Hospital Stay (HOSPITAL_COMMUNITY): Payer: Managed Care, Other (non HMO)

## 2015-07-18 LAB — HEPATITIS A ANTIBODY, TOTAL: Hep A Total Ab: NEGATIVE

## 2015-07-18 LAB — CBC
HCT: 32.2 % — ABNORMAL LOW (ref 39.0–52.0)
Hemoglobin: 10.2 g/dL — ABNORMAL LOW (ref 13.0–17.0)
MCH: 24.6 pg — ABNORMAL LOW (ref 26.0–34.0)
MCHC: 31.7 g/dL (ref 30.0–36.0)
MCV: 77.6 fL — ABNORMAL LOW (ref 78.0–100.0)
Platelets: 657 10*3/uL — ABNORMAL HIGH (ref 150–400)
RBC: 4.15 MIL/uL — ABNORMAL LOW (ref 4.22–5.81)
RDW: 14.8 % (ref 11.5–15.5)
WBC: 17.7 10*3/uL — ABNORMAL HIGH (ref 4.0–10.5)

## 2015-07-18 LAB — COMPREHENSIVE METABOLIC PANEL
ALT: 127 U/L — ABNORMAL HIGH (ref 17–63)
AST: 55 U/L — ABNORMAL HIGH (ref 15–41)
Albumin: 2.6 g/dL — ABNORMAL LOW (ref 3.5–5.0)
Alkaline Phosphatase: 160 U/L — ABNORMAL HIGH (ref 38–126)
Anion gap: 9 (ref 5–15)
BUN: 15 mg/dL (ref 6–20)
CO2: 26 mmol/L (ref 22–32)
Calcium: 8.7 mg/dL — ABNORMAL LOW (ref 8.9–10.3)
Chloride: 98 mmol/L — ABNORMAL LOW (ref 101–111)
Creatinine, Ser: 0.75 mg/dL (ref 0.61–1.24)
GFR calc Af Amer: >60 mL/min (ref >60)
GFR calc non Af Amer: >60 mL/min (ref >60)
Glucose, Bld: 123 mg/dL — ABNORMAL HIGH (ref 65–99)
Potassium: 4.4 mmol/L (ref 3.5–5.1)
Sodium: 133 mmol/L — ABNORMAL LOW (ref 135–145)
Total Bilirubin: 0.8 mg/dL (ref 0.3–1.2)
Total Protein: 7.4 g/dL (ref 6.5–8.1)

## 2015-07-18 LAB — URINALYSIS, ROUTINE W REFLEX MICROSCOPIC
Glucose, UA: NEGATIVE mg/dL
Hgb urine dipstick: NEGATIVE
Ketones, ur: NEGATIVE mg/dL
Leukocytes, UA: NEGATIVE
Nitrite: NEGATIVE
Protein, ur: 100 mg/dL — AB
Specific Gravity, Urine: 1.038 — ABNORMAL HIGH (ref 1.005–1.030)
Urobilinogen, UA: 0.2 mg/dL (ref 0.0–1.0)
pH: 6 (ref 5.0–8.0)

## 2015-07-18 LAB — URINE MICROSCOPIC-ADD ON

## 2015-07-18 LAB — URINE CULTURE
Culture: NO GROWTH
Special Requests: NORMAL

## 2015-07-18 LAB — HEPATITIS C ANTIBODY: HCV Ab: 0.1 s/co ratio (ref 0.0–0.9)

## 2015-07-18 LAB — HEPATITIS B SURFACE ANTIBODY,QUALITATIVE: Hep B S Ab: NONREACTIVE

## 2015-07-18 LAB — HEPATITIS B SURFACE ANTIGEN: Hepatitis B Surface Ag: NEGATIVE

## 2015-07-18 MED ORDER — SACCHAROMYCES BOULARDII 250 MG PO CAPS
250.0000 mg | ORAL_CAPSULE | Freq: Two times a day (BID) | ORAL | Status: DC
  Administered 2015-07-18 – 2015-07-26 (×16): 250 mg via ORAL
  Filled 2015-07-18 (×18): qty 1

## 2015-07-18 NOTE — Clinical Documentation Improvement (Signed)
   Abnormal Lab/Test Results:  *This admission, lab has reported sodium values of 130 and 133.**  Possible Clinical Conditions associated with below indicators  *Hyponatremia  *Hypernatremia  *Normal sodium values  Other Condition  Cannot Clinically Determine   Supporting Information: *Patient has been experiencing diarrhea, abdominal pain and nausea *Admitted with possible worsening colitis, perforation, etc, per H&P*  Treatment Provided: *Normal Saline ordered 243mL bolus at 126mL/hr prn**   Please exercise your independent, professional judgment when responding. A specific answer is not anticipated or expected.   Thank You,  [place] [phone]

## 2015-07-18 NOTE — Care Management Note (Signed)
Case Management Note  Patient Details  Name: Mali A Bevill MRN: [id number] Date of Birth: 07/02/1981  Subjective/Objective:                   dx with UC: exacerbation Action/Plan: Discharge planning  Expected Discharge Date:                  Expected Discharge Plan:  Home/Self Care  In-House Referral:     Discharge planning Services  CM Consult  Post Acute Care Choice:    Choice offered to:     DME Arranged:    DME Agency:     HH Arranged:    HH Agency:     Status of Service:  In process, will continue to follow  Medicare Important Message Given:    Date Medicare IM Given:    Medicare IM give by:    Date Additional Medicare IM Given:    Additional Medicare Important Message give by:     If discussed at [place] of Stay Meetings, dates discussed:    Additional Comments: Utilization Review Complete.  Anticipate home self care. Dellie Catholic, RN 07/18/2015, 3:26 PM

## 2015-07-18 NOTE — Progress Notes (Addendum)
[facility] Progress Note  Subjective:   WBC yesterday 21.6, 17.7 today. Hgb10.2. Had a large loose BM this morning. No appetite, no vomiting or nausea. Less abd pain. LFTs with alkphos 160, AST 55, ALT 127. For ultrasound this morning.Hepatits bloodwork negative. Quantiferon pending.CT with diffuse colonic thickening.Urine culture no growth.Would like to try full liquids--tired of broth.   Objective:  Vital signs in last 24 hours: Temp:  [98.3 F (36.8 C)-98.8 F (37.1 C)] 98.3 F (36.8 C) (09/27 0553) Pulse Rate:  [65-94] 71 (09/27 0553) Resp:  [16-20] 16 (09/27 0553) BP: (118-146)/(65-75) 118/65 mmHg (09/27 0553) SpO2:  [98 %-100 %] 98 % (09/27 0553) Weight:  [193 lb (87.544 kg)] 193 lb (87.544 kg) (09/26 1800) Last BM Date: 07/17/15 General:   Alert, well-developed, in NAD Heart:  Regular rate and rhythm; no murmurs Pulm;lungs clear Abdomen:  Soft, nontender and nondistended. Normal bowel sounds, without guarding, and without rebound.   Extremities:  Without edema. Neurologic: Alert and  oriented x4;  grossly normal neurologically. Psych:  Alert and cooperative. Normal mood and affect.  Intake/Output from previous day: 09/26 0701 - 09/27 0700 In: -  Out: 950 [Urine:950] Intake/Output this shift:    Lab Results:  Recent Labs  07/17/15 1103 07/18/15 0605  WBC 21.6* 17.7*  HGB 11.2* 10.2*  HCT 34.6* 32.2*  PLT 598* 657*   BMET  Recent Labs  07/17/15 1103 07/18/15 0605  NA 130* 133*  K 4.4 4.4  CL 96* 98*  CO2 25 26  GLUCOSE 115* 123*  BUN 14 15  CREATININE 0.78 0.75  CALCIUM 8.6* 8.7*   LFT  Recent Labs  07/18/15 0605  PROT 7.4  ALBUMIN 2.6*  AST 55*  ALT 127*  ALKPHOS 160*  BILITOT 0.8   PT/INR  Recent Labs  07/17/15 1103  LABPROT 15.0  INR 1.16   Hepatitis Panel  Recent Labs  07/17/15 1139  HEPBSAG Negative  HCVAB 0.1    Ct Abdomen Pelvis W Contrast  07/17/2015   CLINICAL DATA:  Newly diagnosed ulcerative  colitis. Lower abdominal pain.  EXAM: CT ABDOMEN AND PELVIS WITH CONTRAST  TECHNIQUE: Multidetector CT imaging of the abdomen and pelvis was performed using the standard protocol following bolus administration of intravenous contrast.  CONTRAST:  174mL OMNIPAQUE IOHEXOL 300 MG/ML  SOLN  COMPARISON:  None.  FINDINGS: Insert basis  Liver, gallbladder, spleen, pancreas, adrenals and kidneys are normal.  Mild diffuse circumferential wall thickening throughout the entire colon, most pronounced in the cecum and ascending colon. There are small pericolonic lymph nodes diffusely. Appendix is visualized and is normal. Small bowel and stomach are unremarkable. No free fluid, free air or adenopathy. Urinary bladder is decompressed, grossly unremarkable. Aorta is normal caliber.  No acute bony abnormality.  Bilateral L5 pars defects noted.  IMPRESSION: Mild diffuse colonic wall thickening, most pronounced in the right colon compatible with patient's recent given history of ulcerative colitis. Small scattered pericolonic lymph nodes. No free air or fluid collection. Appendix is normal. Small bowel is normal.   Electronically Signed   By: Rolm Baptise M.D.   On: 07/17/2015 13:38    ASSESSMENT/PLAN:  34 yo male dx with UC by flex sig last week. Biopsies showed moderately active chronic colitis and CMV was negative. History of C diff 3 months ago and repeat C diff last week was negative.ADmitted yesterday with worsening diarrhea and pain. CT with diffuse colonic thickening, more pronounced on the right. Noted to have elevation  of LFTs (?secondary to colonic inflammation, less likely [place]), for [place] today. Pending results of quantiferon, pt would likely benefit from initiation of biologic therapy (likely remicade while in hospital).Will review with Dr. Fuller Plan.Gi path panel not yet sent--will d/c and instead check stool culture and O&P. D/C enteric precautions.    LOS: 1 day   Hvozdovic, Deloris Ping 07/18/2015, Pager  [phone]     Attending physician's note   I have taken an interval history, reviewed the chart and examined the patient. I agree with the Advanced Practitioner's note, impression and recommendations. Pt feels better today. Much less abdominal pain, now only pain with diarrhea. Had about 5 loose stool since admission. Continue current mgmt. Anticipate starting Remicade in hospital as discussed with Dr. Carlean Purl. Repeat C diff was negative. Awaiting repeat stool studies although GI pathogen panel was negative on 9/20 so probably won't delay Remicade if repeat stool studies are still pending and he is otherwise cleared to start. Awaiting quantiferon gold.   Pricilla Riffle. Fuller Plan, MD [facility] [phone] Mon-Fri 8a-5p [phone] Mon-Fri 5p-8a, weekends, holidays or per [facility]

## 2015-07-19 ENCOUNTER — Inpatient Hospital Stay (HOSPITAL_COMMUNITY): Payer: Managed Care, Other (non HMO)

## 2015-07-19 LAB — COMPREHENSIVE METABOLIC PANEL
ALT: 116 U/L — ABNORMAL HIGH (ref 17–63)
AST: 37 U/L (ref 15–41)
Albumin: 2.7 g/dL — ABNORMAL LOW (ref 3.5–5.0)
Alkaline Phosphatase: 155 U/L — ABNORMAL HIGH (ref 38–126)
Anion gap: 9 (ref 5–15)
BUN: 16 mg/dL (ref 6–20)
CO2: 26 mmol/L (ref 22–32)
Calcium: 8.7 mg/dL — ABNORMAL LOW (ref 8.9–10.3)
Chloride: 97 mmol/L — ABNORMAL LOW (ref 101–111)
Creatinine, Ser: 0.75 mg/dL (ref 0.61–1.24)
GFR calc Af Amer: >60 mL/min (ref >60)
GFR calc non Af Amer: >60 mL/min (ref >60)
Glucose, Bld: 132 mg/dL — ABNORMAL HIGH (ref 65–99)
Potassium: 4.4 mmol/L (ref 3.5–5.1)
Sodium: 132 mmol/L — ABNORMAL LOW (ref 135–145)
Total Bilirubin: 0.7 mg/dL (ref 0.3–1.2)
Total Protein: 7.4 g/dL (ref 6.5–8.1)

## 2015-07-19 LAB — CBC WITH DIFFERENTIAL/PLATELET
Basophils Absolute: 0 10*3/uL (ref 0.0–0.1)
Basophils Relative: 0 %
Eosinophils Absolute: 0 10*3/uL (ref 0.0–0.7)
Eosinophils Relative: 0 %
HCT: 30.6 % — ABNORMAL LOW (ref 39.0–52.0)
Hemoglobin: 9.9 g/dL — ABNORMAL LOW (ref 13.0–17.0)
Lymphocytes Relative: 8 %
Lymphs Abs: 1.2 10*3/uL (ref 0.7–4.0)
MCH: 24.9 pg — ABNORMAL LOW (ref 26.0–34.0)
MCHC: 32.4 g/dL (ref 30.0–36.0)
MCV: 77.1 fL — ABNORMAL LOW (ref 78.0–100.0)
Monocytes Absolute: 1.2 10*3/uL — ABNORMAL HIGH (ref 0.1–1.0)
Monocytes Relative: 8 %
Neutro Abs: 12.2 10*3/uL — ABNORMAL HIGH (ref 1.7–7.7)
Neutrophils Relative %: 84 %
Platelets: 684 10*3/uL — ABNORMAL HIGH (ref 150–400)
RBC: 3.97 MIL/uL — ABNORMAL LOW (ref 4.22–5.81)
RDW: 14.9 % (ref 11.5–15.5)
WBC: 14.6 10*3/uL — ABNORMAL HIGH (ref 4.0–10.5)

## 2015-07-19 LAB — QUANTIFERON IN TUBE
QFT TB AG MINUS NIL VALUE: 0 IU/mL
QUANTIFERON MITOGEN VALUE: 0.02 IU/mL
QUANTIFERON TB AG VALUE: 0.02 IU/mL
QUANTIFERON TB GOLD: UNDETERMINED
Quantiferon Nil Value: 0.02 IU/mL

## 2015-07-19 LAB — C-REACTIVE PROTEIN: CRP: 17.8 mg/dL — ABNORMAL HIGH (ref <1.0)

## 2015-07-19 LAB — QUANTIFERON TB GOLD ASSAY (BLOOD)

## 2015-07-19 MED ORDER — SODIUM CHLORIDE 0.9 % IV SOLN: 5.0000 mg/kg | Freq: Once | INTRAVENOUS | Status: DC

## 2015-07-19 MED ORDER — HYDROCORTISONE ACETATE 25 MG RE SUPP
25.0000 mg | Freq: Two times a day (BID) | RECTAL | Status: DC
  Administered 2015-07-23 – 2015-07-24 (×3): 25 mg via RECTAL
  Filled 2015-07-19 (×12): qty 1

## 2015-07-19 MED ORDER — SODIUM CHLORIDE 0.9 % IV SOLN
INTRAVENOUS | Status: DC
  Administered 2015-07-19 (×2): via INTRAVENOUS

## 2015-07-19 MED ORDER — ONDANSETRON HCL 4 MG/2ML IJ SOLN
4.0000 mg | Freq: Four times a day (QID) | INTRAMUSCULAR | Status: DC
  Filled 2015-07-19: qty 2

## 2015-07-19 MED ORDER — SODIUM CHLORIDE 0.9 % IV SOLN
5.0000 mg/kg | Freq: Once | INTRAVENOUS | Status: AC
  Administered 2015-07-19: 400 mg via INTRAVENOUS
  Filled 2015-07-19: qty 40

## 2015-07-19 MED ORDER — ONDANSETRON HCL 8 MG PO TABS
8.0000 mg | ORAL_TABLET | Freq: Four times a day (QID) | ORAL | Status: DC
  Administered 2015-07-26: 8 mg via ORAL
  Filled 2015-07-19 (×26): qty 1
  Filled 2015-07-19: qty 2
  Filled 2015-07-19 (×5): qty 1

## 2015-07-19 MED ORDER — ONDANSETRON HCL 4 MG/2ML IJ SOLN
4.0000 mg | Freq: Four times a day (QID) | INTRAMUSCULAR | Status: DC
Start: 2015-07-19 — End: 2015-07-19
  Administered 2015-07-19: 4 mg via INTRAVENOUS

## 2015-07-19 MED ORDER — ONDANSETRON HCL 4 MG PO TABS: 4.0000 mg | ORAL_TABLET | Freq: Four times a day (QID) | ORAL | Status: DC

## 2015-07-19 MED ORDER — ENOXAPARIN SODIUM 40 MG/0.4ML ~~LOC~~ SOLN
40.0000 mg | SUBCUTANEOUS | Status: DC
  Administered 2015-07-19 – 2015-07-23 (×4): 40 mg via SUBCUTANEOUS
  Filled 2015-07-19 (×7): qty 0.4

## 2015-07-19 NOTE — Progress Notes (Signed)
ANTICOAGULATION CONSULT NOTE - Initial Consult  Pharmacy Consult for lovenox Indication: VTE prophylaxis  Allergies  Allergen Reactions  . Ibuprofen     Lips swelling, rash, legs hurting    Patient Measurements: Height: 5' 5"  (165.1 cm) Weight: 193 lb (87.544 kg) IBW/kg (Calculated) : 61.5  Vital Signs: Temp: 97.7 F (36.5 C) (09/28 0352) Temp Source: Oral (09/28 0352) BP: 129/72 mmHg (09/28 0352) Pulse Rate: 60 (09/28 0352)  Labs:  Recent Labs  07/17/15 1103 07/18/15 0605 07/19/15 0540  HGB 11.2* 10.2* 9.9*  HCT 34.6* 32.2* 30.6*  PLT 598* 657* 684*  LABPROT 15.0  --   --   INR 1.16  --   --   CREATININE 0.78 0.75 0.75    Estimated Creatinine Clearance: 129.8 mL/min (by C-G formula based on Cr of 0.75).   Medical History: Past Medical History  Diagnosis Date  . GERD (gastroesophageal reflux disease)   . 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 Esophageal reflux 06/08/2014  . Hemorrhoids, internal, with bleeding 06/08/2014    Assessment: Patient's a 34 y.o M with severe colitis and suspected IBD from EGD on 9/20 who was admitted on 9/26 with c/o diarrhea and abd pain.  GI currently consulted for management of UC.  To start lovenox for VTE px.   Plan:  - lovenox 69m SQ q24h - pharmacy will sign off.  Re-consult uKoreaif crcl<30 or plt decreased 50% from baseline or is <100K  Thank you for asking pharmacy to participate in this patient's care.  Pham, Anh P 07/19/2015,2:54 PM

## 2015-07-19 NOTE — Progress Notes (Signed)
[facility] Progress Note  Subjective:  WBC 14.6, Hgb 9.9.  Alk phos 155, ALT 116.Na 132. [place] Abd; IMPRESSION: 1. No ultrasound evidence for acute cholecystitis. Small amount of debris within the gallbladder. 2. Fatty liver.  Quantiferon indeterminate (likely due to steroids) -will check CXR today.Nauseous since last pm but has not vomited, seemed to start after full liquids. Having diarrhea.  Objective:  Vital signs in last 24 hours: Temp:  [97.7 F (36.5 C)-98.7 F (37.1 C)] 97.7 F (36.5 C) (09/28 0352) Pulse Rate:  [60-86] 60 (09/28 0352) Resp:  [18] 18 (09/28 0352) BP: (129-130)/(65-72) 129/72 mmHg (09/28 0352) SpO2:  [96 %-97 %] 97 % (09/28 0352) Last BM Date: 07/18/15 General:   Alert,  Well-developed, appears uncomfortable Heart:  Regular rate and rhythm; no murmurs Pulm;lungs clear Abdomen:  Soft, mild diffuse tenderness to palpation, Normal bowel sounds Extremities:  Without edema. Neurologic: Alert and  oriented x4;  grossly normal neurologically. Psych: Alert and cooperative. Normal mood and affect.  Intake/Output from previous day: 09/27 0701 - 09/28 0700 In: 540 [P.O.:540] Out: 250 [Urine:250] Intake/Output this shift:    Lab Results:  Recent Labs  07/17/15 1103 07/18/15 0605 07/19/15 0540  WBC 21.6* 17.7* 14.6*  HGB 11.2* 10.2* 9.9*  HCT 34.6* 32.2* 30.6*  PLT 598* 657* 684*   BMET  Recent Labs  07/17/15 1103 07/18/15 0605 07/19/15 0540  NA 130* 133* 132*  K 4.4 4.4 4.4  CL 96* 98* 97*  CO2 25 26 26   GLUCOSE 115* 123* 132*  BUN 14 15 16   CREATININE 0.78 0.75 0.75  CALCIUM 8.6* 8.7* 8.7*   LFT  Recent Labs  07/19/15 0540  PROT 7.4  ALBUMIN 2.7*  AST 37  ALT 116*  ALKPHOS 155*  BILITOT 0.7   PT/INR  Recent Labs  07/17/15 1103  LABPROT 15.0  INR 1.16   Hepatitis Panel  Recent Labs  07/17/15 1139  HEPBSAG Negative  HCVAB 0.1    US Abdomen Complete  07/18/2015   CLINICAL DATA:  Abnormal LFTs.  Abdominal cramping for 1 week. History of GERD and ulcerative colitis.  EXAM: ULTRASOUND ABDOMEN COMPLETE  COMPARISON:  CT of the abdomen and pelvis on 07/17/2015  FINDINGS: Gallbladder: There is a small amount of debris within the gallbladder. Gallbladder wall is normal in thickness, 1.4 mm. No sonographic Murphy sign, pericholecystic fluid, or stones.  Common bile duct: Diameter: 5.4 mm  Liver: The liver is echogenic. There is attenuation of the ultrasound wave, poor visualization of the internal hepatic architecture, and loss of definition of the diaphragm. Focal sparing of fatty infiltration adjacent to the gallbladder fossa. No other focal abnormalities is identified within the liver.  IVC: No abnormality visualized.  Pancreas: Visualized portion unremarkable.  Spleen: Size and appearance within normal limits.  Right Kidney: Length: 11.3 cm. Echogenicity within normal limits. No mass or hydronephrosis visualized.  Left Kidney: Length: 12.8 cm. Echogenicity within normal limits. No mass or hydronephrosis visualized.  Abdominal aorta: 2.2 cm  Other findings: None.  IMPRESSION: 1. No ultrasound evidence for acute cholecystitis. Small amount of debris within the gallbladder. 2. Fatty liver.   Electronically Signed   By: Nolon Nations M.D.   On: 07/18/2015 13:03   Ct Abdomen Pelvis W Contrast  07/17/2015   CLINICAL DATA:  Newly diagnosed ulcerative colitis. Lower abdominal pain.  EXAM: CT ABDOMEN AND PELVIS WITH CONTRAST  TECHNIQUE: Multidetector CT imaging of the abdomen and pelvis was performed using the standard  protocol following bolus administration of intravenous contrast.  CONTRAST:  132m OMNIPAQUE IOHEXOL 300 MG/ML  SOLN  COMPARISON:  None.  FINDINGS: Insert basis  Liver, gallbladder, spleen, pancreas, adrenals and kidneys are normal.  Mild diffuse circumferential wall thickening throughout the entire colon, most pronounced in the cecum and ascending colon. There are small pericolonic lymph nodes  diffusely. Appendix is visualized and is normal. Small bowel and stomach are unremarkable. No free fluid, free air or adenopathy. Urinary bladder is decompressed, grossly unremarkable. Aorta is normal caliber.  No acute bony abnormality.  Bilateral L5 pars defects noted.  IMPRESSION: Mild diffuse colonic wall thickening, most pronounced in the right colon compatible with patient's recent given history of ulcerative colitis. Small scattered pericolonic lymph nodes. No free air or fluid collection. Appendix is normal. Small bowel is normal.   Electronically Signed   By: KRolm BaptiseM.D.   On: 07/17/2015 13:38    ASSESSMENT/PLAN:   34yo male with UC dx'd by flex sig last week. Biopsies with moderately active colitis. CMV negative. Hx c diff 3 mos ago, recnt c diff neg.  Admitted 3 days ago with worsening diarrhea and pain. Ct with diffuse colonic thickening, more pronounced on the right.GI pathogen panel from 9/26 pending.TB quantiferon gold indeterminate (likely due to steroids). Will check CXR and due to increased abd pain will check abd film. If CXR nl, will likely initiate remicade.  Hyponatremia liklely due to diarrhea.Will bive NS bolus, then continue IVF while pt still with diarrhea.Will back down on diet to clears.     LOS: 2 days   Hvozdovic, LVita BarleyPA-C 07/19/2015, Pager [phone]

## 2015-07-19 NOTE — Plan of Care (Signed)
Problem: Phase I Progression Outcomes Goal: OOB as tolerated unless otherwise ordered Outcome: Progressing Patient walking to bathroom multiple times during the day.

## 2015-07-20 ENCOUNTER — Other Ambulatory Visit: Payer: Self-pay

## 2015-07-20 DIAGNOSIS — K51911 Ulcerative colitis, unspecified with rectal bleeding: Secondary | ICD-10-CM

## 2015-07-20 LAB — CBC
HCT: 29.5 % — ABNORMAL LOW (ref 39.0–52.0)
Hemoglobin: 9.5 g/dL — ABNORMAL LOW (ref 13.0–17.0)
MCH: 24.5 pg — ABNORMAL LOW (ref 26.0–34.0)
MCHC: 32.2 g/dL (ref 30.0–36.0)
MCV: 76.2 fL — ABNORMAL LOW (ref 78.0–100.0)
Platelets: 698 10*3/uL — ABNORMAL HIGH (ref 150–400)
RBC: 3.87 MIL/uL — ABNORMAL LOW (ref 4.22–5.81)
RDW: 14.9 % (ref 11.5–15.5)
WBC: 10.9 10*3/uL — ABNORMAL HIGH (ref 4.0–10.5)

## 2015-07-20 LAB — GI PATHOGEN PANEL BY PCR, STOOL
C difficile toxin A/B: NOT DETECTED
Campylobacter by PCR: NOT DETECTED
Cryptosporidium by PCR: NOT DETECTED
E coli (ETEC) LT/ST: NOT DETECTED
E coli (STEC): NOT DETECTED
E coli 0157 by PCR: NOT DETECTED
G lamblia by PCR: NOT DETECTED
Norovirus GI/GII: NOT DETECTED
Rotavirus A by PCR: NOT DETECTED
Salmonella by PCR: NOT DETECTED
Shigella by PCR: NOT DETECTED

## 2015-07-20 LAB — BASIC METABOLIC PANEL
Anion gap: 6 (ref 5–15)
BUN: 15 mg/dL (ref 6–20)
CO2: 27 mmol/L (ref 22–32)
Calcium: 8.3 mg/dL — ABNORMAL LOW (ref 8.9–10.3)
Chloride: 98 mmol/L — ABNORMAL LOW (ref 101–111)
Creatinine, Ser: 0.68 mg/dL (ref 0.61–1.24)
GFR calc Af Amer: >60 mL/min (ref >60)
GFR calc non Af Amer: >60 mL/min (ref >60)
Glucose, Bld: 120 mg/dL — ABNORMAL HIGH (ref 65–99)
Potassium: 4.3 mmol/L (ref 3.5–5.1)
Sodium: 131 mmol/L — ABNORMAL LOW (ref 135–145)

## 2015-07-20 LAB — C-REACTIVE PROTEIN: CRP: 19 mg/dL — ABNORMAL HIGH (ref <1.0)

## 2015-07-20 LAB — OVA AND PARASITE EXAMINATION

## 2015-07-20 LAB — PREALBUMIN: Prealbumin: 14.6 mg/dL — ABNORMAL LOW (ref 18–38)

## 2015-07-20 MED ORDER — HYOSCYAMINE SULFATE 0.125 MG SL SUBL
0.2500 mg | SUBLINGUAL_TABLET | Freq: Four times a day (QID) | SUBLINGUAL | Status: DC | PRN
  Administered 2015-07-20: 0.25 mg via SUBLINGUAL
  Filled 2015-07-20 (×2): qty 2

## 2015-07-20 MED ORDER — BOOST / RESOURCE BREEZE PO LIQD
1.0000 | Freq: Three times a day (TID) | ORAL | Status: DC
  Administered 2015-07-20 – 2015-07-23 (×10): 1 via ORAL
  Administered 2015-07-23: 22:00:00 via ORAL
  Administered 2015-07-24 – 2015-07-26 (×2): 1 via ORAL

## 2015-07-20 MED ORDER — SODIUM CHLORIDE 0.9 % IV SOLN
INTRAVENOUS | Status: DC
  Administered 2015-07-20 – 2015-07-22 (×5): via INTRAVENOUS

## 2015-07-20 NOTE — Progress Notes (Signed)
[facility] Progress Note  Subjective:  Rec first infusion of remicade yesterday. WBC  10.9.Still with frequent BMs and cramping. Minimal blood seen with BMs. No nausea, but has not appetite and is afraid to eat because it causes cramping. CRP 19.Prealbumin 14.6.   Objective:  Vital signs in last 24 hours: Temp:  [98 F (36.7 C)-98.9 F (37.2 C)] 98 F (36.7 C) (09/29 0544) Pulse Rate:  [65-76] 70 (09/29 0544) Resp:  [18-19] 18 (09/29 0544) BP: (133-138)/(66-71) 133/66 mmHg (09/29 0544) SpO2:  [96 %-97 %] 97 % (09/29 0544) Last BM Date: 07/19/15 General:   Alert,  Well-developed,    in NAD Heart:  Regular rate and rhythm; no murmurs Pulm;lungs clear Abdomen:  Soft,mild diffuse tenderness to palapation, Normal bowel sounds, without guarding, and without rebound.   Extremities:  Without edema. Neurologic: Alert and  oriented x4;  grossly normal neurologically. Psych: Alert and cooperative. Normal mood and affect.  Intake/Output from previous day: 09/28 0701 - 09/29 0700 In: 240 [P.O.:240] Out: -  Intake/Output this shift:    Lab Results:  Recent Labs  07/18/15 0605 07/19/15 0540 07/20/15 0545  WBC 17.7* 14.6* 10.9*  HGB 10.2* 9.9* 9.5*  HCT 32.2* 30.6* 29.5*  PLT 657* 684* 698*   BMET  Recent Labs  07/18/15 0605 07/19/15 0540 07/20/15 0545  NA 133* 132* 131*  K 4.4 4.4 4.3  CL 98* 97* 98*  CO2 26 26 27   GLUCOSE 123* 132* 120*  BUN 15 16 15   CREATININE 0.75 0.75 0.68  CALCIUM 8.7* 8.7* 8.3*   LFT  Recent Labs  07/19/15 0540  PROT 7.4  ALBUMIN 2.7*  AST 37  ALT 116*  ALKPHOS 155*  BILITOT 0.7   PT/INR  Recent Labs  07/17/15 1103  LABPROT 15.0  INR 1.16   Hepatitis Panel  Recent Labs  07/17/15 1139  HEPBSAG Negative  HCVAB 0.1    Dg Chest 2 View  07/19/2015   CLINICAL DATA:  Positive QuantiFERON-TB Gold test  EXAM: CHEST  2 VIEW  COMPARISON:  None.  FINDINGS: The heart size and mediastinal contours are within  normal limits. Both lungs are clear. The visualized skeletal structures are unremarkable.  IMPRESSION: No active cardiopulmonary disease.   Electronically Signed   By: Skipper Cliche M.D.   On: 07/19/2015 11:23   US Abdomen Complete  07/18/2015   CLINICAL DATA:  Abnormal LFTs. Abdominal cramping for 1 week. History of GERD and ulcerative colitis.  EXAM: ULTRASOUND ABDOMEN COMPLETE  COMPARISON:  CT of the abdomen and pelvis on 07/17/2015  FINDINGS: Gallbladder: There is a small amount of debris within the gallbladder. Gallbladder wall is normal in thickness, 1.4 mm. No sonographic Murphy sign, pericholecystic fluid, or stones.  Common bile duct: Diameter: 5.4 mm  Liver: The liver is echogenic. There is attenuation of the ultrasound wave, poor visualization of the internal hepatic architecture, and loss of definition of the diaphragm. Focal sparing of fatty infiltration adjacent to the gallbladder fossa. No other focal abnormalities is identified within the liver.  IVC: No abnormality visualized.  Pancreas: Visualized portion unremarkable.  Spleen: Size and appearance within normal limits.  Right Kidney: Length: 11.3 cm. Echogenicity within normal limits. No mass or hydronephrosis visualized.  Left Kidney: Length: 12.8 cm. Echogenicity within normal limits. No mass or hydronephrosis visualized.  Abdominal aorta: 2.2 cm  Other findings: None.  IMPRESSION: 1. No ultrasound evidence for acute cholecystitis. Small amount of debris within the gallbladder. 2. Fatty  liver.   Electronically Signed   By: Nolon Nations M.D.   On: 07/18/2015 13:03   Dg Abd 2 Views  07/19/2015   CLINICAL DATA:  Abdominal pain and distention for 2 weeks. Nausea. History of ulcerative colitis.  EXAM: ABDOMEN - 2 VIEW  COMPARISON:  CT abdomen and pelvis 07/17/2015  FINDINGS: There is no bowel obstruction or free air. Mucosal thickening is seen along the transverse colon consistent with the diagnosis and CT findings of colitis The degree of  distention appears similar to prior CT. LEFT colon Air-fluid level is observed on the upright radiograph. No osseous findings. No calcifications. Unremarkable osseous structures.  IMPRESSION: Findings consistent with mucosal edema of the transverse colon and ileus. Similar appearance to prior CT. No definite obstruction or free air.   Electronically Signed   By: Staci Righter M.D.   On: 07/19/2015 11:00    ASSESSMENT/PLAN:   34 yo male with UC dx'd by flex sig last week. Biopsies with moderately active colitis. CMV negative. Hx c diff 3 mos ago, recnt c diff neg. Admitted 3 days ago with worsening diarrhea and pain. Ct with diffuse colonic thickening, more pronounced on the right.GI pathogen panel from 9/26 pending.Recieved first dose of remicade yesterday. On suolumedrol.Marland Kitchen Pt prefers to stay on clear liquids today. Will add boost. Trial of levsin for cramping. NS bolus hyponatremia. Will need second dose remicade in 2 weeks.     LOS: 3 days   Yvaine Jankowiak, Vita Barley PA-C 07/20/2015, Pager [phone]

## 2015-07-21 LAB — BASIC METABOLIC PANEL
Anion gap: 7 (ref 5–15)
BUN: 13 mg/dL (ref 6–20)
CO2: 27 mmol/L (ref 22–32)
Calcium: 8.5 mg/dL — ABNORMAL LOW (ref 8.9–10.3)
Chloride: 99 mmol/L — ABNORMAL LOW (ref 101–111)
Creatinine, Ser: 0.61 mg/dL (ref 0.61–1.24)
GFR calc Af Amer: >60 mL/min (ref >60)
GFR calc non Af Amer: >60 mL/min (ref >60)
Glucose, Bld: 121 mg/dL — ABNORMAL HIGH (ref 65–99)
Potassium: 4.2 mmol/L (ref 3.5–5.1)
Sodium: 133 mmol/L — ABNORMAL LOW (ref 135–145)

## 2015-07-21 LAB — CBC
HCT: 28.9 % — ABNORMAL LOW (ref 39.0–52.0)
Hemoglobin: 9.2 g/dL — ABNORMAL LOW (ref 13.0–17.0)
MCH: 24.1 pg — ABNORMAL LOW (ref 26.0–34.0)
MCHC: 31.8 g/dL (ref 30.0–36.0)
MCV: 75.9 fL — ABNORMAL LOW (ref 78.0–100.0)
Platelets: 707 10*3/uL — ABNORMAL HIGH (ref 150–400)
RBC: 3.81 MIL/uL — ABNORMAL LOW (ref 4.22–5.81)
RDW: 15 % (ref 11.5–15.5)
WBC: 11 10*3/uL — ABNORMAL HIGH (ref 4.0–10.5)

## 2015-07-21 LAB — C-REACTIVE PROTEIN: CRP: 12.4 mg/dL — ABNORMAL HIGH (ref <1.0)

## 2015-07-21 NOTE — Progress Notes (Signed)
     [facility] Progress Note  Subjective:  Diarrhea slowing. Less cramping. Less nausea.Tol clears.   Objective:  Vital signs in last 24 hours: Temp:  [98.1 F (36.7 C)-98.4 F (36.9 C)] 98.1 F (36.7 C) (09/30 0512) Pulse Rate:  [64-91] 64 (09/30 0512) Resp:  [16-18] 16 (09/30 0512) BP: (126-134)/(62-68) 128/68 mmHg (09/30 0512) SpO2:  [96 %-98 %] 96 % (09/30 0512) Last BM Date: 07/20/15 General:   Alert,  Well-developed,    in NAD Heart:  Regular rate and rhythm; no murmurs Pulm;lungs clear Abdomen:  Soft,  Normal bowel sounds, without guarding, and without rebound.   Extremities:  Without edema. Neurologic: Alert and  oriented x4;  grossly normal neurologically. Psych:  Alert and cooperative. Normal mood and affect.  Intake/Output from previous day: 09/29 0701 - 09/30 0700 In: 480 [P.O.:480] Out: -  Intake/Output this shift:    Lab Results:  Recent Labs  07/19/15 0540 07/20/15 0545 07/21/15 0600  WBC 14.6* 10.9* 11.0*  HGB 9.9* 9.5* 9.2*  HCT 30.6* 29.5* 28.9*  PLT 684* 698* 707*   BMET  Recent Labs  07/19/15 0540 07/20/15 0545 07/21/15 0600  NA 132* 131* 133*  K 4.4 4.3 4.2  CL 97* 98* 99*  CO2 26 27 27   GLUCOSE 132* 120* 121*  BUN 16 15 13   CREATININE 0.75 0.68 0.61  CALCIUM 8.7* 8.3* 8.5*   LFT  Recent Labs  07/19/15 0540  PROT 7.4  ALBUMIN 2.7*  AST 37  ALT 116*  ALKPHOS 155*  BILITOT 0.7      ASSESSMENT/PLAN:   34 yo male with UC dx'd by flex sig last week. Biopsies with moderately active colitis. CMV negative. Hx c diff 3 mos ago, recnt c diff neg. Admitted 4 days ago with worsening diarrhea and pain. Ct with diffuse colonic thickening, more pronounced on the right.GI pathogen panel from 9/26 pending.Recieved first dose of remicade yesterday. On solumedrol. Stools slowing. Will advance to full liquids. If tol full liquids and can drink adequate amount, will d/c IV fluids. Cont antispasmodic. Will need remicade in  2 weeks.  Ambulate.    LOS: 4 days   Hvozdovic, Deloris Ping 07/21/2015, Pager [phone]  GI Attending Note  I have personally taken an interval history, reviewed the chart, and examined the patient.  I agree with the extender's note, impression and recommendations. It's unclear whether mesalamine exacerbated his diarrhea.  May consider adding as outpt once he is settled down.  Sandy Salaam. Deatra Ina, MD, [facility] [phone]

## 2015-07-22 LAB — STOOL CULTURE

## 2015-07-22 LAB — C-REACTIVE PROTEIN: CRP: 8.5 mg/dL — ABNORMAL HIGH (ref <1.0)

## 2015-07-22 MED ORDER — HYOSCYAMINE SULFATE 0.125 MG SL SUBL
0.2500 mg | SUBLINGUAL_TABLET | Freq: Four times a day (QID) | SUBLINGUAL | Status: DC
  Administered 2015-07-22 – 2015-07-24 (×9): 0.25 mg via SUBLINGUAL
  Filled 2015-07-22 (×12): qty 2

## 2015-07-22 NOTE — Progress Notes (Signed)
     [facility] Progress Note  Subjective:   CRP 8.5. Had 5 loose BM yesterday. Less pain. Less cramping. Little blood with BMs. No N/V.   Objective:  Vital signs in last 24 hours: Temp:  [97.8 F (36.6 C)-98.2 F (36.8 C)] 97.8 F (36.6 C) (10/01 0531) Pulse Rate:  [64-72] 69 (10/01 0531) Resp:  [18] 18 (10/01 0531) BP: (122-127)/(62-66) 122/62 mmHg (10/01 0531) SpO2:  [93 %-100 %] 93 % (10/01 0531) Last BM Date: 07/21/15 General:   Alert,  Well-developed,    in NAD Heart:  Regular rate and rhythm; no murmurs Pulm;lungs clear Abdomen:  Soft, nontender and nondistended. Normal bowel sounds, without guarding, and without rebound.   Extremities:  Without edema. Neurologic:Alert and  oriented x4;  grossly normal neurologically. Psych: Alert and cooperative. Normal mood and affect.  Intake/Output from previous day: 09/30 0701 - 10/01 0700 In: 1530 [P.O.:480; I.V.:1050] Out: -  Intake/Output this shift:    Lab Results:  Recent Labs  07/20/15 0545 07/21/15 0600  WBC 10.9* 11.0*  HGB 9.5* 9.2*  HCT 29.5* 28.9*  PLT 698* 707*   BMET  Recent Labs  07/20/15 0545 07/21/15 0600  NA 131* 133*  K 4.3 4.2  CL 98* 99*  CO2 27 27  GLUCOSE 120* 121*  BUN 15 13  CREATININE 0.68 0.61  CALCIUM 8.3* 8.5*     ASSESSMENT/PLAN:    34 yo male with UC dx'd by flex sig last week. Biopsies with moderately active colitis. CMV negative. Hx c diff 3 mos ago, recnt c diff neg. Admitted 5 days ago with worsening diarrhea and pain. Ct with diffuse colonic thickening, more pronounced on the right.GI pathogen panel from 9/26 pending.Recieved first dose of remicade yesterday. On solumedrol. Stools slowing.  Cont antispasmodic. Will need remicade in 2 weeks. Ambulate.Adv to soft diet. D/C IVF--hep lock IV. If tol soft diet, will change to po steroids tomorrow and hopefully home Monday.     LOS: 5 days   August Gosser, Vita Barley PA-C 07/22/2015, Pager [phone]

## 2015-07-23 LAB — C-REACTIVE PROTEIN: CRP: 8.5 mg/dL — ABNORMAL HIGH (ref <1.0)

## 2015-07-23 MED ORDER — HYDROCORTISONE 2.5 % RE CREA
TOPICAL_CREAM | Freq: Three times a day (TID) | RECTAL | Status: DC
  Administered 2015-07-23 (×3): via RECTAL
  Administered 2015-07-24: 1 via RECTAL
  Filled 2015-07-23: qty 28.35

## 2015-07-23 MED ORDER — PREDNISONE 20 MG PO TABS
40.0000 mg | ORAL_TABLET | Freq: Every day | ORAL | Status: DC
  Administered 2015-07-23 – 2015-07-26 (×4): 40 mg via ORAL
  Filled 2015-07-23 (×5): qty 2

## 2015-07-23 MED ORDER — FE FUMARATE-B12-VIT C-FA-IFC PO CAPS
1.0000 | ORAL_CAPSULE | Freq: Three times a day (TID) | ORAL | Status: DC
  Administered 2015-07-23 – 2015-07-26 (×10): 1 via ORAL
  Filled 2015-07-23 (×13): qty 1

## 2015-07-23 MED ORDER — WITCH HAZEL-GLYCERIN EX PADS
MEDICATED_PAD | CUTANEOUS | Status: DC | PRN
  Administered 2015-07-23: 11:00:00 via TOPICAL
  Filled 2015-07-23: qty 100

## 2015-07-23 NOTE — Progress Notes (Signed)
     [facility] Progress Note  Subjective:  Feels well. Less diarrhea. Has a sore spot on rectum.   Objective:  Vital signs in last 24 hours: Temp:  [98 F (36.7 C)-98.7 F (37.1 C)] 98 F (36.7 C) (10/02 0520) Pulse Rate:  [65] 65 (10/02 0520) Resp:  [17-18] 18 (10/02 0520) BP: (113-150)/(62-69) 113/62 mmHg (10/02 0520) SpO2:  [97 %-99 %] 97 % (10/02 0520) Last BM Date: 07/23/15 General:   Alert,  Well-developed,    in NAD Heart:  Regular rate and rhythm; no murmurs Pulm;lungs clear Abdomen:  Soft, nontender and nondistended. Normal bowel sounds, without guarding, and without rebound.  Rectal:  External hemorrhoids Extremities:  Without edema. Neurologic: Alert and  oriented x4;  grossly normal neurologically. Psych: Alert and cooperative. Normal mood and affect.  Intake/Output from previous day: 10/01 0701 - 10/02 0700 In: 480 [P.O.:480] Out: -  Intake/Output this shift:    Lab Results:  Recent Labs  07/21/15 0600  WBC 11.0*  HGB 9.2*  HCT 28.9*  PLT 707*   BMET  Recent Labs  07/21/15 0600  NA 133*  K 4.2  CL 99*  CO2 27  GLUCOSE 121*  BUN 13  CREATININE 0.61  CALCIUM 8.5*     ASSESSMENT/PLAN:   34 yo male with UC dx'd by flex sig last week. Biopsies with moderately active colitis. CMV negative. Hx c diff 3 mos ago, recnt c diff neg. Admitted 5 days ago with worsening diarrhea and pain. Ct with diffuse colonic thickening, more pronounced on the right.GI pathogen panel from 9/26 pending.Recieved first dose of remicade yesterday. On solumedrol. Stools slowing. Cont antispasmodic. Will need remicade in 2 weeks.Pt has not been using his hydrocortisone suppositories. Will restart bid x 7 days and use tucks wipes and hydrocortisone cream. Change to po prednisone. Tentatively home tomorrow.     LOS: 6 days   Hvozdovic, Deloris Ping 07/23/2015, Pager [phone]  GI Attending Note  I have personally taken an interval history, reviewed  the chart, and examined the patient.  I agree with the extender's note, impression and recommendations.  Sandy Salaam. Deatra Ina, MD, [facility] [phone]

## 2015-07-24 ENCOUNTER — Inpatient Hospital Stay (HOSPITAL_COMMUNITY): Payer: Managed Care, Other (non HMO)

## 2015-07-24 ENCOUNTER — Inpatient Hospital Stay (HOSPITAL_COMMUNITY): Payer: Managed Care, Other (non HMO) | Admitting: Anesthesiology

## 2015-07-24 ENCOUNTER — Encounter (HOSPITAL_COMMUNITY)
Admission: AD | Disposition: A | Discharge status: Home / Self Care | Payer: Self-pay | Source: Ambulatory Visit | Attending: Gastroenterology

## 2015-07-24 ENCOUNTER — Encounter (HOSPITAL_COMMUNITY): Payer: Self-pay | Admitting: Radiology

## 2015-07-24 DIAGNOSIS — K610 Anal abscess: Secondary | ICD-10-CM

## 2015-07-24 DIAGNOSIS — K51018 Ulcerative (chronic) pancolitis with other complication: Secondary | ICD-10-CM

## 2015-07-24 HISTORY — PX: INCISION AND DRAINAGE PERIRECTAL ABSCESS: SHX1804

## 2015-07-24 LAB — BASIC METABOLIC PANEL
Anion gap: 11 (ref 5–15)
BUN: 11 mg/dL (ref 6–20)
CO2: 24 mmol/L (ref 22–32)
Calcium: 8.5 mg/dL — ABNORMAL LOW (ref 8.9–10.3)
Chloride: 97 mmol/L — ABNORMAL LOW (ref 101–111)
Creatinine, Ser: 0.72 mg/dL (ref 0.61–1.24)
GFR calc Af Amer: >60 mL/min (ref >60)
GFR calc non Af Amer: >60 mL/min (ref >60)
Glucose, Bld: 161 mg/dL — ABNORMAL HIGH (ref 65–99)
Potassium: 3.7 mmol/L (ref 3.5–5.1)
Sodium: 132 mmol/L — ABNORMAL LOW (ref 135–145)

## 2015-07-24 LAB — C-REACTIVE PROTEIN: CRP: 6.8 mg/dL — ABNORMAL HIGH (ref <1.0)

## 2015-07-24 LAB — CBC
HCT: 29.8 % — ABNORMAL LOW (ref 39.0–52.0)
Hemoglobin: 9.4 g/dL — ABNORMAL LOW (ref 13.0–17.0)
MCH: 24.1 pg — ABNORMAL LOW (ref 26.0–34.0)
MCHC: 31.5 g/dL (ref 30.0–36.0)
MCV: 76.4 fL — ABNORMAL LOW (ref 78.0–100.0)
Platelets: 762 10*3/uL — ABNORMAL HIGH (ref 150–400)
RBC: 3.9 MIL/uL — ABNORMAL LOW (ref 4.22–5.81)
RDW: 15.6 % — ABNORMAL HIGH (ref 11.5–15.5)
WBC: 12.2 10*3/uL — ABNORMAL HIGH (ref 4.0–10.5)

## 2015-07-24 LAB — APTT: aPTT: 30 seconds (ref 24–37)

## 2015-07-24 LAB — PROTIME-INR
INR: 1.09 (ref 0.00–1.49)
Prothrombin Time: 14.3 seconds (ref 11.6–15.2)

## 2015-07-24 SURGERY — INCISION AND DRAINAGE, ABSCESS, PERIRECTAL
Anesthesia: General | Site: Rectum

## 2015-07-24 MED ORDER — DICYCLOMINE HCL 20 MG PO TABS
20.0000 mg | ORAL_TABLET | Freq: Four times a day (QID) | ORAL | Status: DC
Start: 2015-07-24 — End: 2015-07-26
  Administered 2015-07-25 – 2015-07-26 (×6): 20 mg via ORAL
  Filled 2015-07-24 (×11): qty 1

## 2015-07-24 MED ORDER — PROPOFOL 10 MG/ML IV BOLUS
INTRAVENOUS | Status: DC | PRN
  Administered 2015-07-24: 200 mg via INTRAVENOUS
  Administered 2015-07-24: 50 mg via INTRAVENOUS

## 2015-07-24 MED ORDER — MIDAZOLAM HCL 2 MG/2ML IJ SOLN
INTRAMUSCULAR | Status: AC
  Filled 2015-07-24: qty 4

## 2015-07-24 MED ORDER — HYDROMORPHONE HCL 1 MG/ML IJ SOLN
1.0000 mg | INTRAMUSCULAR | Status: DC | PRN
  Administered 2015-07-24: 1 mg via INTRAVENOUS
  Administered 2015-07-25: 1.5 mg via INTRAVENOUS
  Administered 2015-07-25 (×2): 1 mg via INTRAVENOUS
  Administered 2015-07-26: 1.5 mg via INTRAVENOUS
  Filled 2015-07-24: qty 2
  Filled 2015-07-24: qty 1
  Filled 2015-07-24: qty 2
  Filled 2015-07-24: qty 1
  Filled 2015-07-24: qty 2

## 2015-07-24 MED ORDER — LACTATED RINGERS IV SOLN
INTRAVENOUS | Status: DC | PRN
  Administered 2015-07-24: 20:00:00 via INTRAVENOUS

## 2015-07-24 MED ORDER — POTASSIUM CHLORIDE IN NACL 20-0.9 MEQ/L-% IV SOLN
INTRAVENOUS | Status: DC
  Administered 2015-07-24 – 2015-07-26 (×3): via INTRAVENOUS
  Filled 2015-07-24 (×5): qty 1000

## 2015-07-24 MED ORDER — SUCCINYLCHOLINE CHLORIDE 20 MG/ML IJ SOLN
INTRAMUSCULAR | Status: DC | PRN
  Administered 2015-07-24: 100 mg via INTRAVENOUS

## 2015-07-24 MED ORDER — LIDOCAINE HCL (CARDIAC) 20 MG/ML IV SOLN
INTRAVENOUS | Status: DC | PRN
  Administered 2015-07-24: 100 mg via INTRAVENOUS

## 2015-07-24 MED ORDER — ONDANSETRON HCL 4 MG/2ML IJ SOLN
INTRAMUSCULAR | Status: AC
  Filled 2015-07-24: qty 2

## 2015-07-24 MED ORDER — BUPIVACAINE-EPINEPHRINE (PF) 0.25% -1:200000 IJ SOLN
INTRAMUSCULAR | Status: AC
  Filled 2015-07-24: qty 30

## 2015-07-24 MED ORDER — PROPOFOL 10 MG/ML IV BOLUS
INTRAVENOUS | Status: AC
  Filled 2015-07-24: qty 20

## 2015-07-24 MED ORDER — BUPIVACAINE-EPINEPHRINE 0.25% -1:200000 IJ SOLN
INTRAMUSCULAR | Status: DC | PRN
  Administered 2015-07-24: 25 mL

## 2015-07-24 MED ORDER — LIDOCAINE HCL (CARDIAC) 20 MG/ML IV SOLN
INTRAVENOUS | Status: AC
  Filled 2015-07-24: qty 5

## 2015-07-24 MED ORDER — MIDAZOLAM HCL 5 MG/5ML IJ SOLN
INTRAMUSCULAR | Status: DC | PRN
  Administered 2015-07-24: 2 mg via INTRAVENOUS

## 2015-07-24 MED ORDER — PROMETHAZINE HCL 25 MG/ML IJ SOLN
6.2500 mg | INTRAMUSCULAR | Status: DC | PRN
Start: 2015-07-24 — End: 2015-07-26

## 2015-07-24 MED ORDER — DEXAMETHASONE SODIUM PHOSPHATE 10 MG/ML IJ SOLN
INTRAMUSCULAR | Status: DC | PRN
  Administered 2015-07-24: 10 mg via INTRAVENOUS

## 2015-07-24 MED ORDER — ONDANSETRON HCL 4 MG/2ML IJ SOLN
INTRAMUSCULAR | Status: DC | PRN
  Administered 2015-07-24: 4 mg via INTRAVENOUS

## 2015-07-24 MED ORDER — 0.9 % SODIUM CHLORIDE (POUR BTL) OPTIME
TOPICAL | Status: DC | PRN
  Administered 2015-07-24: 1000 mL

## 2015-07-24 MED ORDER — POTASSIUM CHLORIDE IN NACL 20-0.9 MEQ/L-% IV SOLN
INTRAVENOUS | Status: DC
  Administered 2015-07-24: 18:00:00 via INTRAVENOUS
  Filled 2015-07-24 (×2): qty 1000

## 2015-07-24 MED ORDER — IOHEXOL 300 MG/ML  SOLN
100.0000 mL | Freq: Once | INTRAMUSCULAR | Status: AC | PRN
  Administered 2015-07-24: 100 mL via INTRAVENOUS

## 2015-07-24 MED ORDER — DEXAMETHASONE SODIUM PHOSPHATE 10 MG/ML IJ SOLN
INTRAMUSCULAR | Status: AC
  Filled 2015-07-24: qty 1

## 2015-07-24 MED ORDER — SODIUM CHLORIDE 0.9 % IV SOLN: INTRAVENOUS | Status: DC

## 2015-07-24 MED ORDER — PIPERACILLIN-TAZOBACTAM 3.375 G IVPB
3.3750 g | Freq: Three times a day (TID) | INTRAVENOUS | Status: DC
  Administered 2015-07-24 – 2015-07-26 (×5): 3.375 g via INTRAVENOUS
  Filled 2015-07-24 (×8): qty 50

## 2015-07-24 MED ORDER — LORAZEPAM 2 MG/ML IJ SOLN
0.5000 mg | Freq: Four times a day (QID) | INTRAMUSCULAR | Status: DC
  Administered 2015-07-24 – 2015-07-26 (×8): 0.5 mg via INTRAVENOUS
  Filled 2015-07-24 (×8): qty 1

## 2015-07-24 MED ORDER — HYDROMORPHONE HCL 1 MG/ML IJ SOLN: 0.2500 mg | INTRAMUSCULAR | Status: DC | PRN

## 2015-07-24 MED ORDER — FENTANYL CITRATE (PF) 100 MCG/2ML IJ SOLN
INTRAMUSCULAR | Status: DC | PRN
  Administered 2015-07-24: 50 ug via INTRAVENOUS
  Administered 2015-07-24: 100 ug via INTRAVENOUS

## 2015-07-24 MED ORDER — FENTANYL CITRATE (PF) 250 MCG/5ML IJ SOLN
INTRAMUSCULAR | Status: AC
  Filled 2015-07-24: qty 25

## 2015-07-24 MED ORDER — IOHEXOL 300 MG/ML  SOLN
25.0000 mL | INTRAMUSCULAR | Status: AC
Start: 2015-07-24 — End: 2015-07-24
  Administered 2015-07-24 (×2): 25 mL via ORAL

## 2015-07-24 SURGICAL SUPPLY — 26 items
BLADE HEX COATED 2.75 (ELECTRODE) ×2 IMPLANT
BLADE SURG 15 STRL LF DISP TIS (BLADE) ×1 IMPLANT
BLADE SURG 15 STRL SS (BLADE) ×2
COVER SURGICAL LIGHT HANDLE (MISCELLANEOUS) ×2 IMPLANT
ELECT PENCIL ROCKER SW 15FT (MISCELLANEOUS) ×2 IMPLANT
ELECT REM PT RETURN 9FT ADLT (ELECTROSURGICAL) ×2
ELECTRODE REM PT RTRN 9FT ADLT (ELECTROSURGICAL) ×1 IMPLANT
GAUZE SPONGE 4X4 12PLY STRL (GAUZE/BANDAGES/DRESSINGS) ×2 IMPLANT
GAUZE SPONGE 4X4 16PLY XRAY LF (GAUZE/BANDAGES/DRESSINGS) ×2 IMPLANT
GLOVE BIOGEL PI IND STRL 7.0 (GLOVE) ×1 IMPLANT
GLOVE BIOGEL PI INDICATOR 7.0 (GLOVE) ×1
GLOVE SURG SIGNA 7.5 PF LTX (GLOVE) ×2 IMPLANT
GOWN SPEC L4 XLG W/TWL (GOWN DISPOSABLE) ×2 IMPLANT
GOWN STRL REUS W/ TWL XL LVL3 (GOWN DISPOSABLE) ×3 IMPLANT
GOWN STRL REUS W/TWL LRG LVL3 (GOWN DISPOSABLE) ×2 IMPLANT
GOWN STRL REUS W/TWL XL LVL3 (GOWN DISPOSABLE) ×6
KIT BASIN OR (CUSTOM PROCEDURE TRAY) ×2 IMPLANT
NDL HYPO 25X1 1.5 SAFETY (NEEDLE) IMPLANT
NEEDLE HYPO 25X1 1.5 SAFETY (NEEDLE) IMPLANT
PACK LITHOTOMY IV (CUSTOM PROCEDURE TRAY) ×2 IMPLANT
SOL PREP PROV IODINE SCRUB 4OZ (MISCELLANEOUS) ×2 IMPLANT
SWAB COLLECTION DEVICE MRSA (MISCELLANEOUS) IMPLANT
SYR CONTROL 10ML LL (SYRINGE) IMPLANT
TOWEL OR 17X26 10 PK STRL BLUE (TOWEL DISPOSABLE) ×2 IMPLANT
UNDERPAD 30X30 INCONTINENT (UNDERPADS AND DIAPERS) ×2 IMPLANT
YANKAUER SUCT BULB TIP 10FT TU (MISCELLANEOUS) ×2 IMPLANT

## 2015-07-24 NOTE — Anesthesia Procedure Notes (Signed)
Procedure Name: Intubation Date/Time: 07/24/2015 8:25 PM Performed by: Lind Covert Pre-anesthesia Checklist: Patient identified, Timeout performed, Emergency Drugs available, Suction available and Patient being monitored Patient Re-evaluated:Patient Re-evaluated prior to inductionOxygen Delivery Method: Circle system utilized Preoxygenation: Pre-oxygenation with 100% oxygen Intubation Type: Rapid sequence, IV induction and Cricoid Pressure applied Laryngoscope Size: Mac and 4 Grade View: Grade I Tube size: 7.0 mm Number of attempts: 1 Airway Equipment and Method: Stylet Placement Confirmation: ETT inserted through vocal cords under direct vision,  breath sounds checked- equal and bilateral and positive ETCO2 Secured at: 22 cm Tube secured with: Tape Dental Injury: Teeth and Oropharynx as per pre-operative assessment

## 2015-07-24 NOTE — Anesthesia Postprocedure Evaluation (Signed)
  Anesthesia Post-op Note  Patient: John [name]  Procedure(s) Performed: Procedure(s): IRRIGATION AND DEBRIDEMENT PERIRECTAL ABSCESS (N/A)  Patient Location: PACU  Anesthesia Type:General  Level of Consciousness: awake  Airway and Oxygen Therapy: Patient Spontanous Breathing  Post-op Pain: mild  Post-op Assessment: Post-op Vital signs reviewed              Post-op Vital Signs: Reviewed  Last Vitals:  Filed Vitals:   07/24/15 2115  BP: 114/56  Pulse: 91  Temp:   Resp: 14    Complications: No apparent anesthesia complications

## 2015-07-24 NOTE — Anesthesia Preprocedure Evaluation (Signed)
Anesthesia Evaluation  Patient identified by MRN, date of birth, ID band Patient awake    Reviewed: Allergy & Precautions, NPO status   Airway Mallampati: II  TM Distance: >3 FB Neck ROM: Full    Dental   Pulmonary neg pulmonary ROS,    breath sounds clear to auscultation       Cardiovascular  Rhythm:Regular Rate:Normal     Neuro/Psych    GI/Hepatic Neg liver ROS, PUD, GERD  ,  Endo/Other  Hypothyroidism   Renal/GU negative Renal ROS     Musculoskeletal  (+) Arthritis ,   Abdominal   Peds  Hematology  (+) anemia ,   Anesthesia Other Findings   Reproductive/Obstetrics                             Anesthesia Physical Anesthesia Plan  ASA: II  Anesthesia Plan: General   Post-op Pain Management:    Induction: Intravenous, Rapid sequence and Cricoid pressure planned  Airway Management Planned: Oral ETT  Additional Equipment:   Intra-op Plan:   Post-operative Plan: Extubation in OR  Informed Consent: I have reviewed the patients History and Physical, chart, labs and discussed the procedure including the risks, benefits and alternatives for the proposed anesthesia with the patient or authorized representative who has indicated his/her understanding and acceptance.   Dental advisory given  Plan Discussed with: Anesthesiologist, CRNA and Surgeon  Anesthesia Plan Comments:         Anesthesia Quick Evaluation

## 2015-07-24 NOTE — Consult Note (Signed)
Reason for Consult:  Right perirectal abscess Referring Physician: Dr. Kennedy Bucker  John [name] is an 34 y.o. male.    HPI: 34 y/o male readmitted 9/26 with diarrhea and abdominal pain.  Work up from prior hospitalization 9/20-9/21/16 included EGD and flexible sigmoidoscopy by Dr. Hilarie Fredrickson.  This showed severe colitis and proctitis, from the rectum to the distal descending colon, consistent with IBD; reflux esophagitis, at the GE junction, small hiatal hernia, retch gastritis. He went home on steroid tape, mesalamine, and Zantac.  He now has a diagnosis of Ulcerative colitis, Gi pathogens are negative, and was just started on Remicade 9/28.  He now is improving but complains of rectal pain. CT scan today shows right perianal 2.4 by 2.4 by 1.9 cm fluid collection with enhancing margins favoring a perianal abscess within the anal spinchter.  We are ask to see.   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 Knee arthroscopy Left   . Esophagogastroduodenoscopy N/A 07/11/2015    Procedure: ESOPHAGOGASTRODUODENOSCOPY (EGD);  Surgeon: Jerene Bears, MD;  Location: [facility];  Service: Gastroenterology;  Laterality: N/A;  . Flexible sigmoidoscopy N/A 07/11/2015    Procedure: FLEXIBLE SIGMOIDOSCOPY;  Surgeon: Jerene Bears, MD;  Location: [facility];  Service: Gastroenterology;  Laterality: N/A;    Family History  Problem Relation Age of Onset  . Diabetes Father   . Colon cancer Father 58  . Heart disease Father   . Hyperlipidemia Father   . Heart disease Mother   . Hyperlipidemia Mother   . Heart disease Brother   . Heart disease Maternal Grandmother   . Heart  disease Maternal Grandfather   . Mental illness Maternal Grandfather   . Heart disease Paternal Grandmother   . Heart disease Paternal Grandfather   . Colon cancer Paternal Grandfather     Social History:  reports that he has never smoked. He has never used smokeless tobacco. He reports that he does not drink alcohol or use illicit drugs.  Allergies:  Allergies  Allergen Reactions  . Ibuprofen     Lips swelling, rash, legs hurting    Medications:  Prior to Admission:  Prescriptions prior to admission  Medication Sig Dispense Refill Last Dose  . HYDROcodone-acetaminophen (NORCO) 5-325 MG per tablet Take 1 tablet by mouth every 4 (four) hours as needed. 12 tablet 0 07/16/2015 at Unknown time  . LEVOTHROID 25 MCG tablet Take 1 tablet (25 mcg total) by mouth daily before breakfast. 30 tablet 2 07/17/2015 at Unknown time  . mesalamine (LIALDA) 1.2 G EC tablet Take 4 tablets (4.8 g total) by mouth daily with breakfast. 120 tablet 0 07/17/2015 at Unknown time  . predniSONE (DELTASONE) 10 MG tablet Take 4 tablets (40 mg total) by mouth daily with breakfast. (Patient taking differently: Take 60 mg by mouth daily with breakfast. ) 100 tablet 0 07/17/2015 at Unknown time  . ranitidine (ZANTAC 75) 75 MG tablet Take 1 tablet (75 mg total) by mouth 2 (two) times daily. 60 tablet 0 07/17/2015 at Unknown time   Scheduled: . enoxaparin (LOVENOX) injection  40 mg Subcutaneous Q24H  . famotidine  20 mg Oral BID  . feeding supplement  1 Container Oral TID BM  . ferrous [id number] C-folic acid  1 capsule Oral TID PC  . hydrocortisone   Rectal TID  . hydrocortisone  25 mg Rectal BID  . hyoscyamine  0.25 mg Sublingual Q6H  . levothyroxine  25 mcg Oral QAC breakfast  . ondansetron  8 mg Oral Q6H   Or  . ondansetron (ZOFRAN) IV  4 mg Intravenous Q6H  . piperacillin-tazobactam (ZOSYN)  IV  3.375 g Intravenous 3 times per day  . predniSONE  40 mg Oral Q breakfast  . saccharomyces boulardii  250 mg  Oral BID  . sodium chloride  3 mL Intravenous Q12H   Continuous:  BMW:[id number] **OR** acetaminophen, alum & mag hydroxide-simeth, HYDROcodone-acetaminophen, HYDROmorphone (DILAUDID) injection, sodium chloride, witch hazel-glycerin Anti-infectives    Start     Dose/Rate Route Frequency Ordered Stop   07/24/15 1630  piperacillin-tazobactam (ZOSYN) IVPB 3.375 g     3.375 g 12.5 mL/hr over 240 Minutes Intravenous 3 times per day 07/24/15 1511        Results for orders placed or performed during the hospital encounter of 07/17/15 (from the past 48 hour(s))  C-reactive protein     Status: Abnormal   Collection Time: 07/23/15  5:36 AM  Result Value Ref Range   CRP 8.5 (H) <1.0 mg/dL    Comment: Performed at [facility]  C-reactive protein     Status: Abnormal   Collection Time: 07/24/15  5:24 AM  Result Value Ref Range   CRP 6.8 (H) <1.0 mg/dL    Comment: Performed at [facility] Pelvis W Contrast  07/24/2015   CLINICAL DATA:  Worsening diarrhea and abdominal pain. Tenesmus. Perirectal pain. Hemorrhoidal disease. Assessment for perirectal abscess.  EXAM: CT PELVIS WITH CONTRAST  TECHNIQUE: Multidetector CT imaging of the pelvis was performed using the standard protocol following the bolus administration of intravenous contrast.  CONTRAST:  157m OMNIPAQUE IOHEXOL 300 MG/ML  SOLN  COMPARISON:  07/17/2015  FINDINGS: Lower Urinary Tract: Bladder and distal ureters unremarkable.  Bowel: Appendiceal diameter 7 mm, within normal limits. Mild apparent wall thickening in the descending colon, sigmoid colon, and rectum, as well as the bottom of the cecum.  In the right perianal region but primarily contained within the external sphincter, we demonstrate a 2.4 by 2.4 by 1.9 cm fluid collection with enhancing margins favoring a perianal abscess. No internal gas. It is possible it a component of this fluid collection extends within the external sphincter and possibly even slightly  beyond the external sphincter given the appearance on image 56 series 602.  Vascular/Lymphatic: Unremarkable  Reproductive: Unremarkable  Other: Small amount of free pelvic fluid, image 25 series 2, abnormal.  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  IMPRESSION: 1. Newly apparent right eccentric perianal abscess, 2.4 cm, tracking along and possibly extending mildly beyond the external sphincter. 2. Wall thickening in the visualized colon in the pelvis, favoring a diffuse colitis. 3. Small amount of free pelvic fluid, nonspecific. 4. 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   Electronically Signed   By: WVan ClinesM.D.   On: 07/24/2015 14:37    Review of Systems  Gastrointestinal: Positive for blood in stool (some on and off).       Worsening rectal pain last 24 hours  All  other systems reviewed and are negative.  Blood pressure 128/71, pulse 70, temperature 98.3 F (36.8 C), temperature source Oral, resp. rate 18, height 5' 5"  (1.651 m), weight 87.544 kg (193 lb), SpO2 97 %. Physical Exam  Constitutional: He is oriented to person, place, and time. He appears well-developed and well-nourished. He appears distressed.  He is having ongoing rectal pain, diarrhea, tenesmus. He is diaphoretic and in BR with continuous symptoms for 40 min.  I have been here.  HENT:  Head: Normocephalic and atraumatic.  Eyes: Conjunctivae and EOM are normal. Right eye exhibits no discharge. Left eye exhibits no discharge. No scleral icterus.  Neck: Normal range of motion. Neck supple. No JVD present. No tracheal deviation present. No thyromegaly present.  Cardiovascular: Normal rate, regular rhythm, normal heart sounds and intact distal pulses.   No murmur heard. Respiratory: Effort normal and breath sounds normal. No respiratory distress. He has no  wheezes. He has no rales. He exhibits no tenderness.  GI: Soft. Bowel sounds are normal. He exhibits no distension and no mass. There is tenderness (mildy tender, doesn't want me to press to hard on abdomen). There is no rebound and no guarding.  Genitourinary:  Dr. Excell Seltzer has examined his rectum.  Musculoskeletal: Normal range of motion. He exhibits no edema.  Lymphadenopathy:    He has no cervical adenopathy.  Neurological: He is alert and oriented to person, place, and time. No cranial nerve deficit.  Skin: No rash noted. He is diaphoretic. No erythema. No pallor.  Cool and diaphoretic right now after being on toilet almost continously for 40 min.  Psychiatric:  He is upset and on his last nerve with the pain and diarrhea.    Assessment/Plan: Right perirectal abscess Ulcerative colitis Recurrent diarrhea, hx of C diff, 03/2015, negative screens this admit Gastritis/esophagitis/hiatal hernia Hypothyroid  Plan:  I am checking labs, starting an IV, antibiotics are ordered. Diarrhea probably worse now, after the CT contrast.  I have left him some dilaudid, and ativan to help him get thru this current exacerbation.  We are going to try and get him to the OR later this evening after he has been NPO for some time. GI is in the room with him and his wife. I have stopped the Lovenox for this evening.    John [name] 07/24/2015, 3:41 PM

## 2015-07-24 NOTE — Transfer of Care (Signed)
Immediate Anesthesia Transfer of Care Note  Patient: John [name]  Procedure(s) Performed: Procedure(s): IRRIGATION AND DEBRIDEMENT PERIRECTAL ABSCESS (N/A)  Patient Location: PACU  Anesthesia Type:General  Level of Consciousness: sedated  Airway & Oxygen Therapy: Patient Spontanous Breathing and Patient connected to face mask oxygen  Post-op Assessment: Report given to RN and Post -op Vital signs reviewed and stable  Post vital signs: Reviewed and stable  Last Vitals:  Filed Vitals:   07/24/15 1400  BP: 136/68  Pulse: 71  Temp: 36.8 C  Resp: 20    Complications: No apparent anesthesia complications

## 2015-07-24 NOTE — Care Management Note (Signed)
Case Management Note  Patient Details  Name: John [name] MRN: [id number] Date of Birth: [birth year]  Subjective/Objective:    S/p  i&d peri rectal abscess. From home.                Action/Plan:d/c home.   Expected Discharge Date:                  Expected Discharge Plan:  Home/Self Care  In-House Referral:     Discharge planning Services  CM Consult  Post Acute Care Choice:    Choice offered to:     DME Arranged:    DME Agency:     HH Arranged:    HH Agency:     Status of Service:  In process, will continue to follow  Medicare Important Message Given:    Date Medicare IM Given:    Medicare IM give by:    Date Additional Medicare IM Given:    Additional Medicare Important Message give by:     If discussed at [place] of Stay Meetings, dates discussed:    Additional Comments:  Dessa Phi, RN 07/24/2015, 9:24 PM

## 2015-07-24 NOTE — Progress Notes (Signed)
     [facility] Progress Note  Subjective:  Appears quite anxious.  Says that he has been in and out of the bathroom all night (did not sleep) and all morning with sensation that he needs to move his bowels; sometimes small amounts come out but sometimes he has to sit there for a while.  Anxious to go home with this ongoing.  Also still having a lot of peri-anal pain despite hemorrhoid treatment.  Objective:  Vital signs in last 24 hours: Temp:  [98.2 F (36.8 C)-98.5 F (36.9 C)] 98.3 F (36.8 C) (10/03 0655) Pulse Rate:  [58-70] 70 (10/03 0655) Resp:  [16-18] 18 (10/03 0655) BP: (128-147)/(65-71) 128/71 mmHg (10/03 0655) SpO2:  [97 %-98 %] 97 % (10/03 0655) Last BM Date: 07/23/15 General:  Alert, Well-developed, in NAD; appears very anxious Heart:  Regular rate and rhythm; no murmurs Pulm:  CTAB.  No W/R/R. Abdomen:  Soft, non-distended. Normal bowel sounds.  Minimal TTP.  Rectal:  Extremely tender on right side of anus and extending out onto buttock.  No erythema or fullness/fluctuance. Extremities:  Without edema. Neurologic:  Alert and oriented x 4;  grossly normal neurologically.  Intake/Output from previous day: 10/02 0701 - 10/03 0700 In: 17 [P.O.:960] Out: -    Assessment / Plan: 34 yo male with UC dx'd by flex sig last week. Biopsies with moderately active colitis. CMV negative. Hx c diff 3 months ago, but recent c diff neg. Admitted again with worsening diarrhea and pain. CT with diffuse colonic thickening, more pronounced on the right. GI pathogen panel from 9/26 negative. Received first dose of Remicade 9/28. Was on solumedrol but switched to PO steroids yesterday. Stools slowing, but complaining of a lot of tenesmus overnight and this morning. Cont antispasmodic. Will need Remicade in 2 weeks.  Also with rectal/peri-anal pain and is being for hemorrhoidal disease; very tender on exam today extending to buttock.  Will order CT scan of the pelvis with  contrast to rule out peri-rectal abscess.  Check CBC, BMP, and CRP tomorrow, 10/4.   LOS: 7 days   ZEHR, JESSICA D.  07/24/2015, 9:11 AM  Pager number [phone]     Attending physician's note   I have taken an interval history, reviewed the chart and examined the patient. I agree with the Advanced Practitioner's note, impression and recommendations. Rectal pain, tenesmus worsening over past few days. Diarrhea and abdominal pain are improving. Pelvic CT reviewed showing a 2.4 cm perianal abscess possibly extending slightly beyond the external sphincter and colon wall thickening. Will consult general surgery and start antibiotics.   Lucio Edward, MD [facility] [phone] Mon-Fri 8a-5p [phone] after 5p, weekends, holidays

## 2015-07-24 NOTE — Progress Notes (Signed)
Patient back in room resting comfortably with family at bedside. No complaints of pain at this time. Family instructed to notify staff when patient needs to void, as he had a sedated procedure. Verbalized understanding. Will continue to monitor patient.

## 2015-07-24 NOTE — Op Note (Signed)
07/17/2015 - 07/24/2015  8:58 PM  PATIENT:  John [name], 34 y.o., male, MRN: [id number]  PREOP DIAGNOSIS:  peri-rectal abscess  POSTOP DIAGNOSIS:   Right peri-anal abscess  PROCEDURE:   Procedure(s): IRRIGATION AND DEBRIDEMENT PERIanal ABSCESS  SURGEON:   Alphonsa Overall, M.D.  ANESTHESIA:   general  Anesthesiologist: Finis Bud, MD CRNA: Lind Covert, CRNA  General  EBL:  <50  ml  DRAINS: Wound packed with 4 x 4 gauze  LOCAL MEDICATIONS USED:   30 cc 1/4% marcaine  SPECIMEN:   Cultures obtained  COUNTS CORRECT:  YES  INDICATIONS FOR PROCEDURE:  John [name] is a 33 y.o. (DOB: [birth year]) white  male whose primary care physician is [name], [name], MD and comes for I&D of perianal abscess.   John [name] has been recently diagnosed with inflammatory bowel disease (ulcerative colitis).  Dr. Carlean Purl is going to follow him as an outpatient.  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   The indications and risks of the surgery were explained to the patient.  The risks include, but are not limited to, infection, bleeding, and nerve injury.  OPERATIVE NOTE:  The patient was taken to room # 1 at [facility] and underwent an general anesthesia.  He was placed in lithotomy position.  He was already on Zosyn as an antibiotic.    A time out was held and the surgical check list run.   His perineum was prepped with Betadine and sterilely draped.  He had a bulge at the right lateral anus.  I incised into a 3 x 3 cm perianal abscess.  I obtained aerobe and anaerobic cultures.   I saw no other perianal stigmata of inflammatory bowel disease around the rectum.      I irrigated the wound with 500 cc of saline.  I infiltrated 30 cc of 1/4% marcaine around the wound.   I packed the wound with saline gauze and sterilely dressed the wound.   The patient tolerated the procedure well and was taken to the recovery room in good condition.  Alphonsa Overall, MD,  [facility] Pager: [phone] Office phone:  [phone]

## 2015-07-25 ENCOUNTER — Other Ambulatory Visit: Payer: Self-pay

## 2015-07-25 ENCOUNTER — Encounter (HOSPITAL_COMMUNITY): Payer: Self-pay | Admitting: Surgery

## 2015-07-25 ENCOUNTER — Telehealth: Payer: Self-pay

## 2015-07-25 ENCOUNTER — Ambulatory Visit: Payer: Managed Care, Other (non HMO) | Admitting: Internal Medicine

## 2015-07-25 DIAGNOSIS — K51011 Ulcerative (chronic) pancolitis with rectal bleeding: Secondary | ICD-10-CM

## 2015-07-25 DIAGNOSIS — K51314 Ulcerative (chronic) rectosigmoiditis with abscess: Principal | ICD-10-CM

## 2015-07-25 DIAGNOSIS — K5190 Ulcerative colitis, unspecified, without complications: Secondary | ICD-10-CM | POA: Insufficient documentation

## 2015-07-25 LAB — CBC
HCT: 28.6 % — ABNORMAL LOW (ref 39.0–52.0)
Hemoglobin: 8.8 g/dL — ABNORMAL LOW (ref 13.0–17.0)
MCH: 23.6 pg — ABNORMAL LOW (ref 26.0–34.0)
MCHC: 30.8 g/dL (ref 30.0–36.0)
MCV: 76.7 fL — ABNORMAL LOW (ref 78.0–100.0)
Platelets: 768 10*3/uL — ABNORMAL HIGH (ref 150–400)
RBC: 3.73 MIL/uL — ABNORMAL LOW (ref 4.22–5.81)
RDW: 15.6 % — ABNORMAL HIGH (ref 11.5–15.5)
WBC: 11 10*3/uL — ABNORMAL HIGH (ref 4.0–10.5)

## 2015-07-25 LAB — C-REACTIVE PROTEIN: CRP: 11 mg/dL — ABNORMAL HIGH (ref <1.0)

## 2015-07-25 MED ORDER — ENOXAPARIN SODIUM 40 MG/0.4ML ~~LOC~~ SOLN
40.0000 mg | SUBCUTANEOUS | Status: DC
  Administered 2015-07-25: 40 mg via SUBCUTANEOUS
  Filled 2015-07-25 (×2): qty 0.4

## 2015-07-25 NOTE — Progress Notes (Signed)
1 Day Post-Op  Subjective: He is feeling better, the dressing has come out with loose BM's.  It feels much better.  He had a 4 x 4 in the site, with just some bloody serous drainage.    Objective: Vital signs in last 24 hours: Temp:  [97.6 F (36.4 C)-98.6 F (37 C)] 97.8 F (36.6 C) (10/04 0615) Pulse Rate:  [66-93] 66 (10/04 0615) Resp:  [13-20] 20 (10/04 0615) BP: (109-136)/(56-68) 121/58 mmHg (10/04 0615) SpO2:  [93 %-100 %] 95 % (10/04 0615) Last BM Date: 07/24/15 PO 100 ml recorded nothing else on I/O Afebrile, VSS WBC 11.0, H/H down some Intake/Output from previous day: 10/03 0701 - 10/04 0700 In: 100 [P.O.:100] Out: -  Intake/Output this shift:    General appearance: alert, cooperative and no distress Skin: site is better, minimal drainage.  Lab Results:   Recent Labs  07/24/15 1758 07/25/15 0559  WBC 12.2* 11.0*  HGB 9.4* 8.8*  HCT 29.8* 28.6*  PLT 762* 768*    BMET  Recent Labs  07/24/15 1758  NA 132*  K 3.7  CL 97*  CO2 24  GLUCOSE 161*  BUN 11  CREATININE 0.72  CALCIUM 8.5*   PT/INR  Recent Labs  07/24/15 1758  LABPROT 14.3  INR 1.09     Recent Labs Lab 07/19/15 0540  AST 37  ALT 116*  ALKPHOS 155*  BILITOT 0.7  PROT 7.4  ALBUMIN 2.7*     Lipase  No results found for: LIPASE   Studies/Results: Ct Pelvis W Contrast  07/24/2015   CLINICAL DATA:  Worsening diarrhea and abdominal pain. Tenesmus. Perirectal pain. Hemorrhoidal disease. Assessment for perirectal abscess.  EXAM: CT PELVIS WITH CONTRAST  TECHNIQUE: Multidetector CT imaging of the pelvis was performed using the standard protocol following the bolus administration of intravenous contrast.  CONTRAST:  173m OMNIPAQUE IOHEXOL 300 MG/ML  SOLN  COMPARISON:  07/17/2015  FINDINGS: Lower Urinary Tract: Bladder and distal ureters unremarkable.  Bowel: Appendiceal diameter 7 mm, within normal limits. Mild apparent wall thickening in the descending colon, sigmoid colon, and  rectum, as well as the bottom of the cecum.  In the right perianal region but primarily contained within the external sphincter, we demonstrate a 2.4 by 2.4 by 1.9 cm fluid collection with enhancing margins favoring a perianal abscess. No internal gas. It is possible it a component of this fluid collection extends within the external sphincter and possibly even slightly beyond the external sphincter given the appearance on image 56 series 602.  Vascular/Lymphatic: Unremarkable  Reproductive: Unremarkable  Other: Small amount of free pelvic fluid, image 25 series 2, abnormal.  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  IMPRESSION: 1. Newly apparent right eccentric perianal abscess, 2.4 cm, tracking along and possibly extending mildly beyond the external sphincter. 2. Wall thickening in the visualized colon in the pelvis, favoring a diffuse colitis. 3. Small amount of free pelvic fluid, nonspecific. 4. 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   Electronically Signed   By: WVan ClinesM.D.   On: 07/24/2015 14:37    Medications: . dicyclomine  20 mg Oral Q6H  . famotidine  20 mg Oral BID  . feeding supplement  1 Container Oral TID BM  . ferrous [id number]-folic acid  1 capsule Oral TID PC  . levothyroxine  25 mcg Oral QAC breakfast  . LORazepam  0.5 mg Intravenous 4 times per day  . ondansetron  8 mg Oral Q6H   Or  . ondansetron (ZOFRAN) IV  4 mg Intravenous Q6H  . piperacillin-tazobactam (ZOSYN)  IV  3.375 g Intravenous 3 times per day  . predniSONE  40 mg Oral Q breakfast  . saccharomyces boulardii  250 mg Oral BID  . sodium chloride  3 mL Intravenous Q12H    Assessment/Plan Right perirectal abscess S/p I&D of perirectal abscess 07/24/15, Dr. Lucia Gaskins Ulcerative colitis Recurrent diarrhea, hx of C diff, 03/2015, negative  screens this admit Gastritis/esophagitis/hiatal hernia Hypothyroid Antibiotics:  Day 2 Zosyn DVT:  Restart Lovenox this PM/SCD   Plan:   I would just put him in the sitz bath and shower to keep site clean, he is still having loose stools and packing it is just going exacerbate the issue.  You can restart the Lovenox this PM.  LOS: 8 days    John [name] 07/25/2015

## 2015-07-25 NOTE — Progress Notes (Signed)
[facility] Progress Note  Subjective:  Feeling better this morning.  Stools are less and cramping/tenesmus seems better.    Objective:  Vital signs in last 24 hours: Temp:  [97.6 F (36.4 C)-98.6 F (37 C)] 97.8 F (36.6 C) (10/04 0615) Pulse Rate:  [66-93] 66 (10/04 0615) Resp:  [13-20] 20 (10/04 0615) BP: (109-136)/(56-71) 121/58 mmHg (10/04 0615) SpO2:  [93 %-100 %] 95 % (10/04 0615) Last BM Date: 07/24/15 General:  Alert, Well-developed, in NAD Heart:  Regular rate and rhythm; no murmurs Pulm:  CTAB.  No W/R/R. Abdomen:  Soft, non-distended. Normal bowel sounds.  Minimal to no TTP. Extremities:  Without edema. Neurologic:  Alert and oriented x 4;  grossly normal neurologically. Psych:  Alert and cooperative. Normal mood and affect.  Intake/Output from previous day: 10/03 0701 - 10/04 0700 In: 100 [P.O.:100] Out: -   Lab Results:  Recent Labs  07/24/15 1758 07/25/15 0559  WBC 12.2* 11.0*  HGB 9.4* 8.8*  HCT 29.8* 28.6*  PLT 762* 768*   BMET  Recent Labs  07/24/15 1758  NA 132*  K 3.7  CL 97*  CO2 24  GLUCOSE 161*  BUN 11  CREATININE 0.72  CALCIUM 8.5*   PT/INR  Recent Labs  07/24/15 1758  LABPROT 14.3  INR 1.09   Ct Pelvis W Contrast  07/24/2015   CLINICAL DATA:  Worsening diarrhea and abdominal pain. Tenesmus. Perirectal pain. Hemorrhoidal disease. Assessment for perirectal abscess.  EXAM: CT PELVIS WITH CONTRAST  TECHNIQUE: Multidetector CT imaging of the pelvis was performed using the standard protocol following the bolus administration of intravenous contrast.  CONTRAST:  151mL OMNIPAQUE IOHEXOL 300 MG/ML  SOLN  COMPARISON:  07/17/2015  FINDINGS: Lower Urinary Tract: Bladder and distal ureters unremarkable.  Bowel: Appendiceal diameter 7 mm, within normal limits. Mild apparent wall thickening in the descending colon, sigmoid colon, and rectum, as well as the bottom of the cecum.  In the right perianal region but primarily  contained within the external sphincter, we demonstrate a 2.4 by 2.4 by 1.9 cm fluid collection with enhancing margins favoring a perianal abscess. No internal gas. It is possible it a component of this fluid collection extends within the external sphincter and possibly even slightly beyond the external sphincter given the appearance on image 56 series 602.  Vascular/Lymphatic: Unremarkable  Reproductive: Unremarkable  Other: Small amount of free pelvic fluid, image 25 series 2, abnormal.  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  IMPRESSION: 1. Newly apparent right eccentric perianal abscess, 2.4 cm, tracking along and possibly extending mildly beyond the external sphincter. 2. Wall thickening in the visualized colon in the pelvis, favoring a diffuse colitis. 3. Small amount of free pelvic fluid, nonspecific. 4. 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   Electronically Signed   By: Van Clines M.D.   On: 07/24/2015 14:37    Assessment / Plan: 34 yo male with UC dx'd by flex sig last week. Biopsies with moderately active colitis. CMV negative. Hx c diff 3 months ago, but recent c diff neg. Admitted again with worsening diarrhea and pain. CT with diffuse colonic thickening, more pronounced on the right. GI pathogen panel from 9/26 negative. Received first dose of Remicade 9/28. Was on solumedrol but switched to PO steroids yesterday. Stools slowing, but complaining of a lot of tenesmus overnight and this morning. Cont  antispasmodic. Will need Remicade next week.  CT scan showed right peri-anal abscess 3 x 3 cm that was drained by surgery last night, 10/3.  Will restart Lovenox tonight per surgery's recommendation.  Also if they could please assist with antibiotic management while he is here and for when he is discharged (currently on Zosyn).  ? Ok  to receive Remicade next week and continue PO steroids.   LOS: 8 days   ZEHR, JESSICA D.  07/25/2015, 6:49 AM  Pager number [phone]    Attending physician's note   I have taken an interval history, reviewed the chart and examined the patient. I agree with the Advanced Practitioner's note, impression and recommendations.  S/p drainage of peri- anal abscess feels better. He also has improvement in bowel frequency and tenesmus. Will continue antibiotics for 7-10 days, likely will switch to cipro+ flagyl on discharge. Due for next Remicade dose on 10/10  K Denzil Magnuson, MD [phone] Mon-Fri 8a-5p [phone] after 5p, weekends, holidays

## 2015-07-25 NOTE — Telephone Encounter (Signed)
Patient is scheduled for Remicade infusion 10/13 8:00 for week 2 11/10 11:00 week 6 10/26/15 8:00 1st of q 8 weeks All infusions at [facility]  Patient's wife notified of the appt dates and times

## 2015-07-25 NOTE — Telephone Encounter (Signed)
-----   Message from Vita Barley Hvozdovic, PA-C sent at 07/20/2015  1:45 PM EDT ----- John [name], can you please set this patient up for Remicade in 2 weeks? His first infusion was on September 28. He will also need one 6 weeks after that. Thank you

## 2015-07-26 LAB — C-REACTIVE PROTEIN: CRP: 6.2 mg/dL — ABNORMAL HIGH (ref <1.0)

## 2015-07-26 MED ORDER — DICYCLOMINE HCL 20 MG PO TABS: 20.0000 mg | ORAL_TABLET | Freq: Four times a day (QID) | ORAL | Status: DC | PRN

## 2015-07-26 MED ORDER — FE FUMARATE-B12-VIT C-FA-IFC PO CAPS: 1.0000 | ORAL_CAPSULE | Freq: Three times a day (TID) | ORAL | Status: DC

## 2015-07-26 MED ORDER — PREDNISONE 10 MG PO TABS
40.0000 mg | ORAL_TABLET | Freq: Every day | ORAL | Status: AC
Start: 2015-07-26 — End: 2015-08-16

## 2015-07-26 MED ORDER — LORAZEPAM 1 MG PO TABS: 1.0000 mg | ORAL_TABLET | Freq: Three times a day (TID) | ORAL | Status: DC

## 2015-07-26 NOTE — Discharge Instructions (Signed)
How to Take a Sitz Bath  A sitz bath is a warm water bath that is taken while you are sitting down. The water should only come up to your hips and should cover your buttocks. Your health care provider may recommend a sitz bath to help you:   · Clean the lower part of your body, including your genital area.  · With itching.  · With pain.  · With sore muscles or muscles that tighten or spasm.  HOW TO TAKE A SITZ BATH  Take 3-4 sitz baths per day or as told by your health care provider.  1. Partially fill a bathtub with warm water. You will only need the water to be deep enough to cover your hips and buttocks when you are sitting in it.  2. If your health care provider told you to put medicine in the water, follow the directions exactly.  3. Sit in the water and open the tub drain a little.  4. Turn on the warm water again to keep the tub at the correct level. Keep the water running constantly.  5. Soak in the water for 15-20 minutes or as told by your health care provider.  6. After the sitz bath, pat the affected area dry first. Do not rub it.  7. Be careful when you stand up after the sitz bath because you may feel dizzy.  SEEK MEDICAL CARE IF:  · Your symptoms get worse. Do not continue with sitz baths if your symptoms get worse.  · You have new symptoms. Do not continue with sitz baths until you talk with your health care provider.     This information is not intended to replace advice given to you by your health care provider. Make sure you discuss any questions you have with your health care provider.     Document Released: 06/29/2004 Document Revised: 02/21/2015 Document Reviewed: 10/05/2014  Elsevier Interactive Patient Education ©2016 [employer]

## 2015-07-26 NOTE — Progress Notes (Signed)
2 Days Post-Op  Subjective: He is feeling much better.  His rectal site is much more comfortable.  Less diarrhea today also.  Objective: Vital signs in last 24 hours: Temp:  [98.2 F (36.8 C)-98.7 F (37.1 C)] 98.2 F (36.8 C) (10/05 0539) Pulse Rate:  [68-78] 76 (10/05 0539) Resp:  [18-20] 18 (10/05 0539) BP: (115-132)/(56-66) 115/56 mmHg (10/05 0539) SpO2:  [92 %-98 %] 96 % (10/05 0539) Last BM Date: 07/25/15   480 PO recorded  BM x 2 recorded Afebrile, VSS WBC better, CRP up some  Intake/Output from previous day: 10/04 0701 - 10/05 0700 In: 480 [P.O.:480] Out: -  Intake/Output this shift:    General appearance: alert, cooperative, no distress and allot happier with life than he has been over the last 48hours. Skin: Open site right buttocks, is much better.  it is open and some yellow exudate still in site, but no erythema, or swelling.  He is doing a good job of keeping it clean.  Lab Results:   Recent Labs  07/24/15 1758 07/25/15 0559  WBC 12.2* 11.0*  HGB 9.4* 8.8*  HCT 29.8* 28.6*  PLT 762* 768*    BMET  Recent Labs  07/24/15 1758  NA 132*  K 3.7  CL 97*  CO2 24  GLUCOSE 161*  BUN 11  CREATININE 0.72  CALCIUM 8.5*   PT/INR  Recent Labs  07/24/15 1758  LABPROT 14.3  INR 1.09    No results for input(s): AST, ALT, ALKPHOS, BILITOT, PROT, ALBUMIN in the last 168 hours.   Lipase  No results found for: LIPASE   Studies/Results: Ct Pelvis W Contrast  07/24/2015   CLINICAL DATA:  Worsening diarrhea and abdominal pain. Tenesmus. Perirectal pain. Hemorrhoidal disease. Assessment for perirectal abscess.  EXAM: CT PELVIS WITH CONTRAST  TECHNIQUE: Multidetector CT imaging of the pelvis was performed using the standard protocol following the bolus administration of intravenous contrast.  CONTRAST:  122m OMNIPAQUE IOHEXOL 300 MG/ML  SOLN  COMPARISON:  07/17/2015  FINDINGS: Lower Urinary Tract: Bladder and distal ureters unremarkable.  Bowel:  Appendiceal diameter 7 mm, within normal limits. Mild apparent wall thickening in the descending colon, sigmoid colon, and rectum, as well as the bottom of the cecum.  In the right perianal region but primarily contained within the external sphincter, we demonstrate a 2.4 by 2.4 by 1.9 cm fluid collection with enhancing margins favoring a perianal abscess. No internal gas. It is possible it a component of this fluid collection extends within the external sphincter and possibly even slightly beyond the external sphincter given the appearance on image 56 series 602.  Vascular/Lymphatic: Unremarkable  Reproductive: Unremarkable  Other: Small amount of free pelvic fluid, image 25 series 2, abnormal.  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  IMPRESSION: 1. Newly apparent right eccentric perianal abscess, 2.4 cm, tracking along and possibly extending mildly beyond the external sphincter. 2. Wall thickening in the visualized colon in the pelvis, favoring a diffuse colitis. 3. Small amount of free pelvic fluid, nonspecific. 4. 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   Electronically Signed   By: WVan ClinesM.D.   On: 07/24/2015 14:37    Medications: . dicyclomine  20 mg Oral Q6H  . enoxaparin (LOVENOX) injection  40 mg Subcutaneous Q24H  . famotidine  20 mg Oral BID  . feeding supplement  1 Container Oral TID  BM  . ferrous [id number] C-folic acid  1 capsule Oral TID PC  . levothyroxine  25 mcg Oral QAC breakfast  . LORazepam  0.5 mg Intravenous 4 times per day  . ondansetron  8 mg Oral Q6H   Or  . ondansetron (ZOFRAN) IV  4 mg Intravenous Q6H  . piperacillin-tazobactam (ZOSYN)  IV  3.375 g Intravenous 3 times per day  . predniSONE  40 mg Oral Q breakfast  . saccharomyces boulardii  250 mg Oral BID  . sodium chloride  3 mL  Intravenous Q12H    Assessment/Plan Right perirectal abscess S/p I&D of perirectal abscess 07/24/15, Dr. Lucia Gaskins Ulcerative colitis Recurrent diarrhea, hx of C diff, 03/2015, negative screens this admit Gastritis/esophagitis/hiatal hernia Hypothyroid Antibiotics: Day 3 Zosyn DVT:  Lovenox/SCD   Plan:  Discussed and we are OK with stopping antibiotics.  He can continue to clean site with shower/sitz baths.  I will leave information for follow up with Dr. Lucia Gaskins as needed.      LOS: 9 days    John [name] 07/26/2015

## 2015-07-26 NOTE — Discharge Summary (Signed)
[facility] Discharge Summary  Name: John [name] MRN: [id number] DOB: [birth year] 34 y.o. PCP:  Doreene Nest, MD  Date of Admission: 07/17/2015  9:48 AM Date of Discharge: 07/26/2015 Attending Physician: Ladene Artist, MD  Discharge Diagnosis: Active Problems:   Diarrhea   Ulcerative rectosigmoiditis with abscess [facility])  Consultations: Treatment Team:  Md Edison Pace, MD  Procedures Performed:  Dg Chest 2 View  07/19/2015   CLINICAL DATA:  Positive QuantiFERON-TB Gold test  EXAM: CHEST  2 VIEW  COMPARISON:  None.  FINDINGS: The heart size and mediastinal contours are within normal limits. Both lungs are clear. The visualized skeletal structures are unremarkable.  IMPRESSION: No active cardiopulmonary disease.   Electronically Signed   By: Skipper Cliche M.D.   On: 07/19/2015 11:23   Ct Pelvis W Contrast  07/24/2015   CLINICAL DATA:  Worsening diarrhea and abdominal pain. Tenesmus. Perirectal pain. Hemorrhoidal disease. Assessment for perirectal abscess.  EXAM: CT PELVIS WITH CONTRAST  TECHNIQUE: Multidetector CT imaging of the pelvis was performed using the standard protocol following the bolus administration of intravenous contrast.  CONTRAST:  164m OMNIPAQUE IOHEXOL 300 MG/ML  SOLN  COMPARISON:  07/17/2015  FINDINGS: Lower Urinary Tract: Bladder and distal ureters unremarkable.  Bowel: Appendiceal diameter 7 mm, within normal limits. Mild apparent wall thickening in the descending colon, sigmoid colon, and rectum, as well as the bottom of the cecum.  In the right perianal region but primarily contained within the external sphincter, we demonstrate a 2.4 by 2.4 by 1.9 cm fluid collection with enhancing margins favoring a perianal abscess. No internal gas. It is possible it a component of this fluid collection extends within the external sphincter and possibly even slightly beyond the external sphincter given the appearance on image 56 series 602.  Vascular/Lymphatic:  Unremarkable  Reproductive: Unremarkable  Other: Small amount of free pelvic fluid, image 25 series 2, abnormal.  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  IMPRESSION: 1. Newly apparent right eccentric perianal abscess, 2.4 cm, tracking along and possibly extending mildly beyond the external sphincter. 2. Wall thickening in the visualized colon in the pelvis, favoring a diffuse colitis. 3. Small amount of free pelvic fluid, nonspecific. 4. 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   Electronically Signed   By: WVan ClinesM.D.   On: 07/24/2015 14:37   UKoreaAbdomen Complete  07/18/2015   CLINICAL DATA:  Abnormal LFTs. Abdominal cramping for 1 week. History of GERD and ulcerative colitis.  EXAM: ULTRASOUND ABDOMEN COMPLETE  COMPARISON:  CT of the abdomen and pelvis on 07/17/2015  FINDINGS: Gallbladder: There is a small amount of debris within the gallbladder. Gallbladder wall is normal in thickness, 1.4 mm. No sonographic Murphy sign, pericholecystic fluid, or stones.  Common bile duct: Diameter: 5.4 mm  Liver: The liver is echogenic. There is attenuation of the ultrasound wave, poor visualization of the internal hepatic architecture, and loss of definition of the diaphragm. Focal sparing of fatty infiltration adjacent to the gallbladder fossa. No other focal abnormalities is identified within the liver.  IVC: No abnormality visualized.  Pancreas: Visualized portion unremarkable.  Spleen: Size and appearance within normal limits.  Right Kidney: Length: 11.3 cm. Echogenicity within normal limits. No mass or hydronephrosis visualized.  Left Kidney: Length: 12.8 cm. Echogenicity within normal limits. No mass or hydronephrosis visualized.  Abdominal aorta: 2.2 cm  Other findings: None.  IMPRESSION: 1. No ultrasound evidence for acute cholecystitis. Small  amount of debris within the gallbladder. 2. Fatty liver.   Electronically Signed   By: Nolon Nations M.D.   On: 07/18/2015 13:03   Ct Abdomen Pelvis W Contrast  07/17/2015   CLINICAL DATA:  Newly diagnosed ulcerative colitis. Lower abdominal pain.  EXAM: CT ABDOMEN AND PELVIS WITH CONTRAST  TECHNIQUE: Multidetector CT imaging of the abdomen and pelvis was performed using the standard protocol following bolus administration of intravenous contrast.  CONTRAST:  183m OMNIPAQUE IOHEXOL 300 MG/ML  SOLN  COMPARISON:  None.  FINDINGS: Insert basis  Liver, gallbladder, spleen, pancreas, adrenals and kidneys are normal.  Mild diffuse circumferential wall thickening throughout the entire colon, most pronounced in the cecum and ascending colon. There are small pericolonic lymph nodes diffusely. Appendix is visualized and is normal. Small bowel and stomach are unremarkable. No free fluid, free air or adenopathy. Urinary bladder is decompressed, grossly unremarkable. Aorta is normal caliber.  No acute bony abnormality.  Bilateral L5 pars defects noted.  IMPRESSION: Mild diffuse colonic wall thickening, most pronounced in the right colon compatible with patient's recent given history of ulcerative colitis. Small scattered pericolonic lymph nodes. No free air or fluid collection. Appendix is normal. Small bowel is normal.   Electronically Signed   By: KRolm BaptiseM.D.   On: 07/17/2015 13:38   Dg Abd 2 Views  07/19/2015   CLINICAL DATA:  Abdominal pain and distention for 2 weeks. Nausea. History of ulcerative colitis.  EXAM: ABDOMEN - 2 VIEW  COMPARISON:  CT abdomen and pelvis 07/17/2015  FINDINGS: There is no bowel obstruction or free air. Mucosal thickening is seen along the transverse colon consistent with the diagnosis and CT findings of colitis The degree of distention appears similar to prior CT. LEFT colon Air-fluid level is observed on the upright radiograph. No osseous findings. No calcifications. Unremarkable  osseous structures.  IMPRESSION: Findings consistent with mucosal edema of the transverse colon and ileus. Similar appearance to prior CT. No definite obstruction or free air.   Electronically Signed   By: JStaci RighterM.D.   On: 07/19/2015 11:00    GI Procedures: None this admission.  History/Physical Exam:  See Admission H&P  Admission HPI:  34yo male with UC dx'd by flex sig last week. Biopsies with moderately active colitis. CMV negative. Hx c diff 3 months ago, but recent c diff neg. Admitted again with worsening diarrhea and pain. CT with diffuse colonic thickening, more pronounced on the right. GI pathogen panel from 9/26 negative. Received first dose of Remicade 9/28.  Received IV steroids but then switched to PO steroid 10/2. Next Remicade scheduled for 10/13.  On antispasmodic. Patient complained of a lot of rectal/anal pain on 10/3.  CT scan showed right peri-anal abscess 3 x 3 cm that was drained by surgery 10/3. Received IV Zosyn.  Per surgery, no need for further antibiotics after discharge.  Tolerating diet well and feeling much better post I&D.   Discharge Vitals:  BP 115/56 mmHg  Pulse 76  Temp(Src) 98.2 F (36.8 C) (Oral)  Resp 18  Ht 5' 5"  (1.651 m)  Wt 193 lb (87.544 kg)  BMI 32.12 kg/m2  SpO2 96%  Discharge Labs:  Results for orders placed or performed during the hospital encounter of 07/17/15 (from the past 24 hour(s))  C-reactive protein     Status: Abnormal   Collection Time: 07/26/15  5:37 AM  Result Value  Ref Range   CRP 6.2 (H) <1.0 mg/dL    Disposition and follow-up:   Mr.John [name] was discharged from [facility] in stable condition.    Follow-up Appointments:  Will schedule follow-up with Dr. Carlean Purl.     Discharge Instructions    Activity as tolerated - No restrictions    Complete by:  As directed      Call MD for:  redness, tenderness, or signs of infection (pain, swelling, bleeding, redness, odor or green/yellow discharge around  incision site)    Complete by:  As directed      Call MD for:  temperature >100.5    Complete by:  As directed      Resume previous diet    Complete by:  As directed   Soft, low fiber, lactose free for now.          Discharge Medications:   Medication List    STOP taking these medications        mesalamine 1.2 G EC tablet  Commonly known as:  LIALDA      TAKE these medications        dicyclomine 20 MG tablet  Commonly known as:  BENTYL  Take 1 tablet (20 mg total) by mouth every 6 (six) hours as needed for spasms.     ferrous [id number] C-folic acid capsule  Commonly known as:  TRINSICON / FOLTRIN  Take 1 capsule by mouth 3 (three) times daily after meals.     HYDROcodone-acetaminophen 5-325 MG tablet  Commonly known as:  NORCO  Take 1 tablet by mouth every 4 (four) hours as needed.     LEVOTHROID 25 MCG tablet  Generic drug:  levothyroxine  Take 1 tablet (25 mcg total) by mouth daily before breakfast.     LORazepam 1 MG tablet  Commonly known as:  ATIVAN  Take 1 tablet (1 mg total) by mouth every 8 (eight) hours.     predniSONE 10 MG tablet  Commonly known as:  DELTASONE  Take 4 tablets (40 mg total) by mouth daily with breakfast.     ranitidine 75 MG tablet  Commonly known as:  ZANTAC 75  Take 1 tablet (75 mg total) by mouth 2 (two) times daily.        SignedMyrtice Lauth, JESSICA D. 07/26/2015, 10:14 AM

## 2015-07-26 NOTE — Progress Notes (Addendum)
[facility] Progress Note  Subjective:  Last dose of dilaudid was at midnight and then he slept through until 730 this AM.  Having much less pain and diarrhea.   Objective:  Vital signs in last 24 hours: Temp:  [98.2 F (36.8 C)-98.7 F (37.1 C)] 98.2 F (36.8 C) (10/05 0539) Pulse Rate:  [68-78] 76 (10/05 0539) Resp:  [18-20] 18 (10/05 0539) BP: (115-132)/(56-66) 115/56 mmHg (10/05 0539) SpO2:  [92 %-98 %] 96 % (10/05 0539) Last BM Date: 07/25/15 General:  Alert, Well-developed, in NAD Heart:  Regular rate and rhythm; no murmurs Pulm:  CTAB.  No W/R/R. Abdomen:  Soft, non-distended. Normal bowel sounds.  Non-tender. Extremities:  Without edema. Neurologic:  Alert and  oriented x4;  grossly normal neurologically. Psych:  Alert and cooperative. Normal mood and affect.  Intake/Output from previous day: 10/04 0701 - 10/05 0700 In: 480 [P.O.:480] Out: -   Lab Results:  Recent Labs  07/24/15 1758 07/25/15 0559  WBC 12.2* 11.0*  HGB 9.4* 8.8*  HCT 29.8* 28.6*  PLT 762* 768*   BMET  Recent Labs  07/24/15 1758  NA 132*  K 3.7  CL 97*  CO2 24  GLUCOSE 161*  BUN 11  CREATININE 0.72  CALCIUM 8.5*   PT/INR  Recent Labs  07/24/15 1758  LABPROT 14.3  INR 1.09   Ct Pelvis W Contrast  07/24/2015   CLINICAL DATA:  Worsening diarrhea and abdominal pain. Tenesmus. Perirectal pain. Hemorrhoidal disease. Assessment for perirectal abscess.  EXAM: CT PELVIS WITH CONTRAST  TECHNIQUE: Multidetector CT imaging of the pelvis was performed using the standard protocol following the bolus administration of intravenous contrast.  CONTRAST:  124mL OMNIPAQUE IOHEXOL 300 MG/ML  SOLN  COMPARISON:  07/17/2015  FINDINGS: Lower Urinary Tract: Bladder and distal ureters unremarkable.  Bowel: Appendiceal diameter 7 mm, within normal limits. Mild apparent wall thickening in the descending colon, sigmoid colon, and rectum, as well as the bottom of the cecum.  In the right  perianal region but primarily contained within the external sphincter, we demonstrate a 2.4 by 2.4 by 1.9 cm fluid collection with enhancing margins favoring a perianal abscess. No internal gas. It is possible it a component of this fluid collection extends within the external sphincter and possibly even slightly beyond the external sphincter given the appearance on image 56 series 602.  Vascular/Lymphatic: Unremarkable  Reproductive: Unremarkable  Other: Small amount of free pelvic fluid, image 25 series 2, abnormal.  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  IMPRESSION: 1. Newly apparent right eccentric perianal abscess, 2.4 cm, tracking along and possibly extending mildly beyond the external sphincter. 2. Wall thickening in the visualized colon in the pelvis, favoring a diffuse colitis. 3. Small amount of free pelvic fluid, nonspecific. 4. 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   Electronically Signed   By: Van Clines M.D.   On: 07/24/2015 14:37   Assessment / Plan: *34 yo male with UC dx'd by flex sig last week. Biopsies with moderately active colitis. CMV negative. Hx c diff 3 months ago, but recent c diff neg. Admitted again with worsening diarrhea and pain. CT with diffuse colonic thickening, more pronounced on the right. GI pathogen panel from 9/26 negative. Received first dose of Remicade 9/28. On PO steroid since 10/2.  Next Remicade scheduled for 10/13.  Continue anti-spasmodic prn.  CT  scan showed right peri-anal abscess 3 x 3 cm that was drained by surgery 10/3.  Per surgery, no need for further antibiotics.     LOS: 9 days   ZEHR, JESSICA D.  07/26/2015, 9:49 AM  Pager number [phone]    Attending physician's note   I have taken an interval history, reviewed the chart and examined the patient. I agree with the Advanced  Practitioner's note, impression and recommendations.  UC symptoms adequately controlled, appropriate for discharge. CRP has decreased to 6.2. Continue Prednisone 40 mg daily and Bentyl prn. Next Remicade infusion currently planned for 10/13 but could dose sooner if CRP trend is not favorable.  Perianal abscess drained on 10/3 and surgery does not recommend continuing antibiotics.  Plan for discharge home today and outpatient follow up with Dr. Carlean Purl.   Lucio Edward, MD [facility] [phone] Mon-Fri 8a-5p [phone] after 5p, weekends, holidays

## 2015-07-26 NOTE — Progress Notes (Signed)
Discharge instructions given to pt, verbalized understanding. Left the unit in stable condition. 

## 2015-07-27 LAB — CULTURE, ROUTINE-ABSCESS

## 2015-07-28 ENCOUNTER — Telehealth: Payer: Self-pay | Admitting: Internal Medicine

## 2015-07-28 NOTE — Telephone Encounter (Signed)
Spoke to Dr. Maxwell Caul  Remicade is approved. 6 months  Pharmacy #  [id number]  Medical  [id number]  Gatha Mayer, MD, [facility]

## 2015-07-29 LAB — ANAEROBIC CULTURE

## 2015-07-31 ENCOUNTER — Ambulatory Visit (INDEPENDENT_AMBULATORY_CARE_PROVIDER_SITE_OTHER): Payer: Managed Care, Other (non HMO) | Admitting: Internal Medicine

## 2015-07-31 ENCOUNTER — Encounter: Payer: Self-pay | Admitting: Internal Medicine

## 2015-07-31 ENCOUNTER — Other Ambulatory Visit (INDEPENDENT_AMBULATORY_CARE_PROVIDER_SITE_OTHER): Payer: Managed Care, Other (non HMO)

## 2015-07-31 ENCOUNTER — Other Ambulatory Visit: Payer: Self-pay

## 2015-07-31 VITALS — BP 104/54 | HR 84 | Temp 98.5°F | Ht 65.0 in | Wt 189.8 lb

## 2015-07-31 DIAGNOSIS — D62 Acute posthemorrhagic anemia: Secondary | ICD-10-CM | POA: Diagnosis not present

## 2015-07-31 DIAGNOSIS — R509 Fever, unspecified: Secondary | ICD-10-CM | POA: Diagnosis not present

## 2015-07-31 DIAGNOSIS — Z7960 Long term (current) use of unspecified immunomodulators and immunosuppressants: Secondary | ICD-10-CM

## 2015-07-31 DIAGNOSIS — K51014 Ulcerative (chronic) pancolitis with abscess: Secondary | ICD-10-CM

## 2015-07-31 DIAGNOSIS — Z79899 Other long term (current) drug therapy: Secondary | ICD-10-CM | POA: Insufficient documentation

## 2015-07-31 DIAGNOSIS — K51814 Other ulcerative colitis with abscess: Secondary | ICD-10-CM | POA: Diagnosis not present

## 2015-07-31 DIAGNOSIS — K611 Rectal abscess: Secondary | ICD-10-CM | POA: Diagnosis not present

## 2015-07-31 HISTORY — DX: Rectal abscess: K61.1

## 2015-07-31 HISTORY — DX: Long term (current) use of unspecified immunomodulators and immunosuppressants: Z79.60

## 2015-07-31 HISTORY — DX: Other long term (current) drug therapy: Z79.899

## 2015-07-31 LAB — HIGH SENSITIVITY CRP: CRP, High Sensitivity: 26.56 mg/L — ABNORMAL HIGH (ref 0.000–5.000)

## 2015-07-31 LAB — CBC WITH DIFFERENTIAL/PLATELET
Basophils Absolute: 0 10*3/uL (ref 0.0–0.1)
Basophils Relative: 0.1 % (ref 0.0–3.0)
Eosinophils Absolute: 0.1 10*3/uL (ref 0.0–0.7)
Eosinophils Relative: 1 % (ref 0.0–5.0)
HCT: 28.2 % — ABNORMAL LOW (ref 39.0–52.0)
Hemoglobin: 9 g/dL — ABNORMAL LOW (ref 13.0–17.0)
Lymphocytes Relative: 8.5 % — ABNORMAL LOW (ref 12.0–46.0)
Lymphs Abs: 0.9 10*3/uL (ref 0.7–4.0)
MCHC: 31.8 g/dL (ref 30.0–36.0)
MCV: 76.1 fl — ABNORMAL LOW (ref 78.0–100.0)
Monocytes Absolute: 0.5 10*3/uL (ref 0.1–1.0)
Monocytes Relative: 4.5 % (ref 3.0–12.0)
Neutro Abs: 8.9 10*3/uL — ABNORMAL HIGH (ref 1.4–7.7)
Neutrophils Relative %: 85.9 % — ABNORMAL HIGH (ref 43.0–77.0)
Platelets: 710 10*3/uL — ABNORMAL HIGH (ref 150.0–400.0)
RBC: 3.71 Mil/uL — ABNORMAL LOW (ref 4.22–5.81)
RDW: 16.8 % — ABNORMAL HIGH (ref 11.5–15.5)
WBC: 10.4 10*3/uL (ref 4.0–10.5)

## 2015-07-31 NOTE — Progress Notes (Signed)
   Subjective:    Patient ID: John [name], male    DOB: 06-14-1981, 34 y.o.   MRN: [id number] Chief complaint: Follow-up ulcerative colitis, low-grade fever HPI John has been improving after hospitalization for severe ulcerative colitis flare, onset, and also had a perirectal abscess incised and drained. 3 or 4 stools a day sort of forming up. Bleeding is minimal it sounds like and his abscess is not draining any more. 2 days ago he had attempt a 100.4 Fahrenheit and again yesterday and then a lower grade temperature this morning. There are no focal symptoms no myalgias upper respiratory complaints. No rash. He'll return in other family members not ill. Remains on prednisone at 40 mg daily and is due for Remicade infusion #2 was part of induction therapy in 3 days. Medications, allergies, past medical history, past surgical history, family history and social history are reviewed and updated in the EMR.   Review of Systems As above    Objective:   Physical Exam @BP  104/54 mmHg  Pulse 84  Temp(Src) 98.5 F (36.9 C)  Ht 5' 5"  (1.651 m)  Wt 189 lb 12.8 oz (86.093 kg)  BMI 31.58 kg/m2@  General:  NAD Eyes:   anicteric Lungs:  clear Heart:: S1S2 no rubs, murmurs or gallops Abdomen:  soft and nontender, BS+  Rectal exam shows a right anterior small pile, there is a surgical defect visible, insertion of the finger shows is none tender, there is a surgical defect palpable, there is no tenderness mass or fluctuance or drainage. Perianal exam is otherwise intact as well. Ext:   no edema, cyanosis or clubbing oh signs of DVT.    Data Reviewed:  Lab Results  Component Value Date   WBC 10.4 07/31/2015   HGB 9.0* 07/31/2015   HCT 28.2* 07/31/2015   MCV 76.1* 07/31/2015   PLT 710.0* 07/31/2015   High sensitivity C-reactive protein was done and is 26.5 which is much lower than 209 2 weeks ago.        Assessment & Plan:  Ulcerative colitis with abscess (HCC) Improved RTC 6 weeks Low  grade fever ??? No obvious cause, he will monitor and let me know if these persist and we'll pursue further investigation depending upon signs and symptoms and clinical course if needed Continue with Remicade induction. Reduce prednisone to 30 mg daily Follow-up CBC C-reactive protein already drawn today  Acute blood loss anemia F/u Hgb and he will be starting iron repletion  Long-term use of immunosuppressant medication - Humira He will need a flu vaccine and pneumonia vaccines but in the face of these low-grade possible fevers I will hold off on any of this  Perirectal abscess This seems to be adequately treated and I don't think there is any recurrence based upon my current exam   low-grade fever MAXIMUM TEMPERATURE 100.4 Fahrenheit

## 2015-07-31 NOTE — Assessment & Plan Note (Signed)
He will need a flu vaccine and pneumonia vaccines but in the face of these low-grade possible fevers I will hold off on any of this

## 2015-07-31 NOTE — Patient Instructions (Signed)
  Follow up with Dr Carlean Purl on November 18th at 3:15pm.   Decrease prednisone to 30mg  daily.   Need to get a flu shot when no fever.    I appreciate the opportunity to care for you. Silvano Rusk, MD, [facility]

## 2015-07-31 NOTE — Assessment & Plan Note (Signed)
This seems to be adequately treated and I don't think there is any recurrence based upon my current exam

## 2015-07-31 NOTE — Assessment & Plan Note (Addendum)
Improved RTC 6 weeks, we'll see about returning to work later this month depending on how it goes. Low grade fever ??? No obvious cause, he will monitor and let me know if these persist and we'll pursue further investigation depending upon signs and symptoms and clinical course if needed Continue with Remicade induction. Reduce prednisone to 30 mg daily Follow-up CBC C-reactive protein already drawn today Results entered into chart. Hemoglobin 9 which is stable. C-reactive protein much lower.

## 2015-07-31 NOTE — Assessment & Plan Note (Addendum)
F/u Hgb and he will be starting iron repletion

## 2015-08-02 ENCOUNTER — Other Ambulatory Visit: Payer: Managed Care, Other (non HMO)

## 2015-08-02 ENCOUNTER — Telehealth: Payer: Self-pay | Admitting: Internal Medicine

## 2015-08-02 ENCOUNTER — Ambulatory Visit (INDEPENDENT_AMBULATORY_CARE_PROVIDER_SITE_OTHER)
Admission: RE | Admit: 2015-08-02 | Discharge: 2015-08-02 | Disposition: A | Discharge status: Home / Self Care | Payer: Managed Care, Other (non HMO) | Source: Ambulatory Visit | Attending: Internal Medicine | Admitting: Internal Medicine

## 2015-08-02 ENCOUNTER — Encounter: Payer: Self-pay | Admitting: Internal Medicine

## 2015-08-02 ENCOUNTER — Other Ambulatory Visit (INDEPENDENT_AMBULATORY_CARE_PROVIDER_SITE_OTHER): Payer: Managed Care, Other (non HMO)

## 2015-08-02 ENCOUNTER — Other Ambulatory Visit: Payer: Self-pay

## 2015-08-02 DIAGNOSIS — R509 Fever, unspecified: Secondary | ICD-10-CM

## 2015-08-02 DIAGNOSIS — R197 Diarrhea, unspecified: Secondary | ICD-10-CM

## 2015-08-02 LAB — URINALYSIS, ROUTINE W REFLEX MICROSCOPIC
Bilirubin Urine: NEGATIVE
Hgb urine dipstick: NEGATIVE
Leukocytes, UA: NEGATIVE
Nitrite: NEGATIVE
Specific Gravity, Urine: >=1.03 — AB (ref 1.000–1.030)
Urine Glucose: NEGATIVE
Urobilinogen, UA: 0.2 (ref 0.0–1.0)
pH: 6 (ref 5.0–8.0)

## 2015-08-02 NOTE — Telephone Encounter (Signed)
Orders were entered Patient aware

## 2015-08-02 NOTE — Telephone Encounter (Signed)
Pa/lat cxr

## 2015-08-02 NOTE — Telephone Encounter (Signed)
T 100.9  No focal sxs though increasing diarrhea  I saw him 2 d ago exam ok  Remicade tomorrow  CXR, UA, C diff PCR today use dx fever and diarrhea

## 2015-08-03 ENCOUNTER — Ambulatory Visit: Payer: Managed Care, Other (non HMO) | Admitting: Internal Medicine

## 2015-08-03 ENCOUNTER — Other Ambulatory Visit: Payer: Self-pay

## 2015-08-03 ENCOUNTER — Encounter (HOSPITAL_COMMUNITY): Payer: Self-pay

## 2015-08-03 ENCOUNTER — Telehealth: Payer: Self-pay

## 2015-08-03 ENCOUNTER — Encounter (HOSPITAL_COMMUNITY)
Admit: 2015-08-03 | Discharge: 2015-08-03 | Disposition: A | Discharge status: Home / Self Care | Payer: Managed Care, Other (non HMO) | Source: Ambulatory Visit | Attending: Internal Medicine | Admitting: Internal Medicine

## 2015-08-03 VITALS — BP 118/67 | HR 99 | Temp 98.6°F | Resp 20 | Ht 65.0 in | Wt 189.6 lb

## 2015-08-03 DIAGNOSIS — K51011 Ulcerative (chronic) pancolitis with rectal bleeding: Secondary | ICD-10-CM | POA: Diagnosis present

## 2015-08-03 LAB — CLOSTRIDIUM DIFFICILE BY PCR: Toxigenic C. Difficile by PCR: DETECTED — CR

## 2015-08-03 MED ORDER — DIPHENHYDRAMINE HCL 50 MG/ML IJ SOLN
25.0000 mg | Freq: Once | INTRAMUSCULAR | Status: AC
  Administered 2015-08-03: 25 mg via INTRAVENOUS

## 2015-08-03 MED ORDER — SODIUM CHLORIDE 0.9 % IV SOLN
INTRAVENOUS | Status: DC
  Administered 2015-08-03: 09:00:00 via INTRAVENOUS

## 2015-08-03 MED ORDER — VANCOMYCIN HCL 125 MG PO CAPS: 125.0000 mg | ORAL_CAPSULE | Freq: Four times a day (QID) | ORAL | Status: DC

## 2015-08-03 MED ORDER — DIPHENHYDRAMINE HCL 25 MG PO TABS
50.0000 mg | ORAL_TABLET | Freq: Every day | ORAL | Status: DC
  Filled 2015-08-03: qty 2

## 2015-08-03 MED ORDER — ACETAMINOPHEN 325 MG PO TABS
650.0000 mg | ORAL_TABLET | Freq: Every day | ORAL | Status: DC
  Administered 2015-08-03: 650 mg via ORAL
  Filled 2015-08-03: qty 2

## 2015-08-03 MED ORDER — SODIUM CHLORIDE 0.9 % IV SOLN
5.0000 mg/kg | Freq: Once | INTRAVENOUS | Status: AC
  Administered 2015-08-03: 400 mg via INTRAVENOUS
  Filled 2015-08-03: qty 40

## 2015-08-03 MED ORDER — METHYLPREDNISOLONE SODIUM SUCC 125 MG IJ SOLR
125.0000 mg | Freq: Once | INTRAMUSCULAR | Status: AC
  Administered 2015-08-03: 125 mg via INTRAVENOUS
  Filled 2015-08-03: qty 2

## 2015-08-03 MED ORDER — DIPHENHYDRAMINE HCL 25 MG PO CAPS
50.0000 mg | ORAL_CAPSULE | Freq: Every day | ORAL | Status: DC
  Administered 2015-08-03: 50 mg via ORAL
  Filled 2015-08-03: qty 2

## 2015-08-03 NOTE — Progress Notes (Signed)
Patient receiving Remicade in Short Stay. Second dose as got first dose as inpatient recently. Ramped up drug per Remicade protocol. Rate at 80cc for 15 minutes and patient called RN and stated SOB and chest tightness. Face red, sweating, and clearing throat. Remicade immediately stopped and NS running wide open. VS 135/76 89% RA 118 Hr 32 resp. O2 2L applied. EKG done - sinus tach. Spoke to Sherri with Dr. Carlean Purl and he ordered 25mg  Benadryl iv and 125 mg Solumderol IV. Given. 02 sats up to 95% on 2L.   Face less red and patient states feeling better. See flowsheet for subsequent vital signs. Wife at bedside. Continue to monitor closely. Patient stating no more chest tightness.

## 2015-08-03 NOTE — Discharge Instructions (Signed)
ATTENTION:  Go to admitting prior to appointment in short stay to check in ATTENTION:  If you are going to be 15 or more minutes late for your appointment, please call [phone] to make other arrangements for your treatment.  If you arrive early for your schedule appointment, you may have to wait until your scheduled time.  If you are going to be 15 or more minutes late for your appointment, please call [phone] to make other arrangements for your treatment.  If you arrive early for your schedule appointment, you may have to wait until your scheduled time.     Remicade  Infliximab injection What is this medicine? INFLIXIMAB (in [place] i mab) is used to treat Crohn's disease and ulcerative colitis. It is also used to treat ankylosing spondylitis, psoriasis, and some forms of arthritis. This medicine may be used for other purposes; ask your health care provider or pharmacist if you have questions. What should I tell my health care provider before I take this medicine? They need to know if you have any of these conditions: -diabetes -exposure to tuberculosis -heart failure -hepatitis or liver disease -immune system problems -infection -lung or breathing disease, like COPD -multiple sclerosis -current or past resident of [place] or [place] -seizure disorder -an unusual or allergic reaction to infliximab, mouse proteins, other medicines, foods, dyes, or preservatives -pregnant or trying to get pregnant -breast-feeding How should I use this medicine? This medicine is for injection into a vein. It is usually given by a health care professional in a hospital or clinic setting. A special MedGuide will be given to you by the pharmacist with each prescription and refill. Be sure to read this information carefully each time. Talk to your pediatrician regarding the use of this medicine in children. Special care may be needed. Overdosage: If you think you have taken too much of  this medicine contact a poison control center or emergency room at once. NOTE: This medicine is only for you. Do not share this medicine with others. What if I miss a dose? It is important not to miss your dose. Call your doctor or health care professional if you are unable to keep an appointment. What may interact with this medicine? Do not take this medicine with any of the following medications: -anakinra -rilonacept This medicine may also interact with the following medications: -vaccines This list may not describe all possible interactions. Give your health care provider a list of all the medicines, herbs, non-prescription drugs, or dietary supplements you use. Also tell them if you smoke, drink alcohol, or use illegal drugs. Some items may interact with your medicine. What should I watch for while using this medicine? Visit your doctor or health care professional for regular checks on your progress. If you get a cold or other infection while receiving this medicine, call your doctor or health care professional. Do not treat yourself. This medicine may decrease your body's ability to fight infections. Before beginning therapy, your doctor may do a test to see if you have been exposed to tuberculosis. This medicine may make the symptoms of heart failure worse in some patients. If you notice symptoms such as increased shortness of breath or swelling of the ankles or legs, contact your health care provider right away. If you are going to have surgery or dental work, tell your health care professional or dentist that you have received this medicine. If you take this medicine for plaque psoriasis, stay out of the sun. If you  cannot avoid being in the sun, wear protective clothing and use sunscreen. Do not use sun lamps or tanning beds/booths. What side effects may I notice from receiving this medicine? Side effects that you should report to your doctor or health care professional as soon as  possible: -allergic reactions like skin rash, itching or hives, swelling of the face, lips, or tongue -chest pain -fever or chills, usually related to the infusion -muscle or joint pain -red, scaly patches or raised bumps on the skin -signs of infection - fever or chills, cough, sore throat, pain or difficulty passing urine -swollen lymph nodes in the neck, underarm, or groin areas -unexplained weight loss -unusual bleeding or bruising -unusually weak or tired -yellowing of the eyes or skin Side effects that usually do not require medical attention (report to your doctor or health care professional if they continue or are bothersome): -headache -heartburn or stomach pain -nausea, vomiting This list may not describe all possible side effects. Call your doctor for medical advice about side effects. You may report side effects to FDA at 1-800-FDA-1088. Where should I keep my medicine? This drug is given in a hospital or clinic and will not be stored at home. NOTE: This sheet is a summary. It may not cover all possible information. If you have questions about this medicine, talk to your doctor, pharmacist, or health care provider.    2016, Elsevier/Gold Standard. (2008-05-25 10:26:02)

## 2015-08-03 NOTE — Progress Notes (Signed)
Spoke to [facility] at Dr. Celesta Aver office. Informed her patient back to baseline vs. Sherri stated to cancel future remicade appointments. Patient may be d/c to care of his wife now (who works at Dr. Celesta Aver office). Patient and wife in agreement.

## 2015-08-03 NOTE — Progress Notes (Signed)
Patient states no chest pain or other symptoms. VSS. Escorted to exit via wheelchair into care of wife John [name] who works at Dr. Celesta Aver office. Patient and wife walked across the street to MD office.

## 2015-08-03 NOTE — Telephone Encounter (Signed)
Received call from Short stay that patient had received approximately 50 cc of his Remicade infusion and developed facial flushing,  Chest pain, SOB, decreased O2 sats to 88%.  They stopped infusion, started a saline drip and placed him on O2 at [employer].  Discussed with Dr. Carlean Purl.  D/C remicade and list as an allergy, 125 mg IV solumedrol, 25 mg IV Bendadryl.  Orders given to Manuela Schwartz at Short stay.  Manuela Schwartz called with an update patient is back to baseline.  He will be discharged to his wife.  Dr. Carlean Purl notified.  Per Dr. Carlean Purl initiate Humira induction and maintenance dosages.  Referral form faxed to [facility].

## 2015-08-03 NOTE — Telephone Encounter (Signed)
[facility] called with positive C-diff results Discussed with Dr. Carlean Purl  Vancomycin 125 mg 1 po QID for 10 days.   Wife notified and rx sent

## 2015-08-05 NOTE — Progress Notes (Signed)
Quick Note:  On Tx and aware ______

## 2015-08-07 NOTE — Telephone Encounter (Signed)
[facility] called and Humira has been approved.  They will reach out to the patient today to arrange delivery of Humira

## 2015-08-08 ENCOUNTER — Ambulatory Visit (INDEPENDENT_AMBULATORY_CARE_PROVIDER_SITE_OTHER): Payer: Managed Care, Other (non HMO) | Admitting: Physician Assistant

## 2015-08-08 ENCOUNTER — Encounter: Payer: Self-pay | Admitting: Physician Assistant

## 2015-08-08 ENCOUNTER — Telehealth: Payer: Self-pay | Admitting: Emergency Medicine

## 2015-08-08 ENCOUNTER — Other Ambulatory Visit (INDEPENDENT_AMBULATORY_CARE_PROVIDER_SITE_OTHER): Payer: Managed Care, Other (non HMO)

## 2015-08-08 ENCOUNTER — Other Ambulatory Visit: Payer: Self-pay

## 2015-08-08 VITALS — BP 114/58 | HR 100 | Temp 99.0°F | Ht 65.0 in | Wt 190.0 lb

## 2015-08-08 DIAGNOSIS — A0472 Enterocolitis due to Clostridium difficile, not specified as recurrent: Secondary | ICD-10-CM

## 2015-08-08 DIAGNOSIS — A047 Enterocolitis due to Clostridium difficile: Secondary | ICD-10-CM

## 2015-08-08 DIAGNOSIS — K51914 Ulcerative colitis, unspecified with abscess: Secondary | ICD-10-CM

## 2015-08-08 DIAGNOSIS — K51819 Other ulcerative colitis with unspecified complications: Secondary | ICD-10-CM | POA: Diagnosis not present

## 2015-08-08 LAB — CBC WITH DIFFERENTIAL/PLATELET
Basophils Absolute: 0 10*3/uL (ref 0.0–0.1)
Basophils Relative: 0.2 % (ref 0.0–3.0)
Eosinophils Absolute: 0.1 10*3/uL (ref 0.0–0.7)
Eosinophils Relative: 0.6 % (ref 0.0–5.0)
HCT: 29.6 % — ABNORMAL LOW (ref 39.0–52.0)
Hemoglobin: 9.3 g/dL — ABNORMAL LOW (ref 13.0–17.0)
Lymphocytes Relative: 13.4 % (ref 12.0–46.0)
Lymphs Abs: 1.4 10*3/uL (ref 0.7–4.0)
MCHC: 31.5 g/dL (ref 30.0–36.0)
MCV: 75.6 fl — ABNORMAL LOW (ref 78.0–100.0)
Monocytes Absolute: 0.4 10*3/uL (ref 0.1–1.0)
Monocytes Relative: 3.7 % (ref 3.0–12.0)
Neutro Abs: 8.5 10*3/uL — ABNORMAL HIGH (ref 1.4–7.7)
Neutrophils Relative %: 82.1 % — ABNORMAL HIGH (ref 43.0–77.0)
Platelets: 624 10*3/uL — ABNORMAL HIGH (ref 150.0–400.0)
RBC: 3.91 Mil/uL — ABNORMAL LOW (ref 4.22–5.81)
RDW: 18.2 % — ABNORMAL HIGH (ref 11.5–15.5)
WBC: 10.3 10*3/uL (ref 4.0–10.5)

## 2015-08-08 LAB — HIGH SENSITIVITY CRP: CRP, High Sensitivity: 30.77 mg/L — ABNORMAL HIGH (ref 0.000–5.000)

## 2015-08-08 MED ORDER — RANITIDINE HCL 150 MG PO TABS: 150.0000 mg | ORAL_TABLET | Freq: Every day | ORAL | Status: DC

## 2015-08-08 MED ORDER — ADALIMUMAB 40 MG/0.8ML ~~LOC~~ AJKT: 40.0000 mg | AUTO-INJECTOR | SUBCUTANEOUS | Status: DC

## 2015-08-08 NOTE — Telephone Encounter (Signed)
Opened in Error.

## 2015-08-08 NOTE — Addendum Note (Signed)
Addended by: Marlon Pel on: 08/08/2015 03:31 PM   Modules accepted: Orders

## 2015-08-08 NOTE — Progress Notes (Signed)
Patient ID: John A Dukes, male   DOB: 06-26-1981, 34 y.o.   MRN: [id number]     History of Present Illness: John [name] is a 34 year old male who was last seen here on October 10. He is status post a recent hospitalization for a severe flare of his ulcerative colitis. He was also noted to have a perirectal abscess on that admission. He was seen by [facility] surgery while in hospital and had the abscess drained. He is currently having 2 or 3 formed bowel movements each morning with no bleeding or mucus. He is here today because he noted a small lump near the rectum he has no drainage or pain. He has had no fever or chills that he is aware of. He is currently on prednisone 30 mg a day. He had his second Remicade last week. He is also currently on vancomycin for C. difficile. He is also taking Florastor.   Past Medical History  Diagnosis Date  . GERD (gastroesophageal reflux disease)   . 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 Esophageal reflux 06/08/2014  . Hemorrhoids, internal, with bleeding 06/08/2014  . UC (ulcerative colitis) ([place])   . Perirectal abscess 07/31/2015    Past Surgical History  Procedure Laterality Date  . Knee arthroscopy Left   . Esophagogastroduodenoscopy N/A 07/11/2015    Procedure: ESOPHAGOGASTRODUODENOSCOPY (EGD);  Surgeon: Jerene Bears, MD;  Location: [facility];  Service: Gastroenterology;  Laterality: N/A;  . Flexible sigmoidoscopy N/A 07/11/2015    Procedure: FLEXIBLE SIGMOIDOSCOPY;  Surgeon: Jerene Bears, MD;  Location: [facility];  Service: Gastroenterology;  Laterality: N/A;  . Incision and drainage perirectal abscess N/A 07/24/2015    Procedure: IRRIGATION AND DEBRIDEMENT PERIRECTAL ABSCESS;  Surgeon: Alphonsa Overall, MD;  Location: [facility] ORS;  Service: General;  Laterality: N/A;   Family History  Problem Relation Age of Onset  . Diabetes Father   . Colon cancer Father 39  . Heart disease Father   . Hyperlipidemia Father   . Heart disease Mother   .  Hyperlipidemia Mother   . Heart disease Brother   . Heart disease Maternal Grandmother   . Heart disease Maternal Grandfather   . Mental illness Maternal Grandfather   . Heart disease Paternal Grandmother   . Heart disease Paternal Grandfather   . Colon cancer Paternal Grandfather    Social History  Substance Use Topics  . Smoking status: Never Smoker   . Smokeless tobacco: Never Used  . Alcohol Use: No   Current Outpatient Prescriptions  Medication Sig Dispense Refill  . dicyclomine (BENTYL) 20 MG tablet Take 1 tablet (20 mg total) by mouth every 6 (six) hours as needed for spasms. 90 tablet 2  . ferrous [id number] C-folic acid (TRINSICON / FOLTRIN) capsule Take 1 capsule by mouth 3 (three) times daily after meals. 90 capsule 5  . HYDROcodone-acetaminophen (NORCO) 5-325 MG per tablet Take 1 tablet by mouth every 4 (four) hours as needed. 12 tablet 0  . LEVOTHROID 25 MCG tablet Take 1 tablet (25 mcg total) by mouth daily before breakfast. 30 tablet 2  . LORazepam (ATIVAN) 1 MG tablet Take 1 tablet (1 mg total) by mouth every 8 (eight) hours. 30 tablet 0  . predniSONE (DELTASONE) 10 MG tablet Take 4 tablets (40 mg total) by mouth daily with breakfast. (Patient taking differently: Take 30 mg by mouth daily with breakfast. ) 100 tablet 0  . ranitidine (ZANTAC 75) 75 MG tablet Take 1 tablet (75  mg total) by mouth 2 (two) times daily. 60 tablet 0  . vancomycin (VANCOCIN HCL) 125 MG capsule Take 1 capsule (125 mg total) by mouth 4 (four) times daily. 40 capsule 0  . ranitidine (ZANTAC) 150 MG tablet Take 1 tablet (150 mg total) by mouth at bedtime. 30 tablet 6   No current facility-administered medications for this visit.   Allergies  Allergen Reactions  . Remicade [Infliximab] Anaphylaxis  . Ibuprofen     Lips swelling, rash, legs hurting     Review of Systems: Gen: Denies any fever, chills, sweats, anorexia, fatigue, weakness, malaise, weight loss, and sleep disorder CV:  Denies chest pain, angina, palpitations, syncope, orthopnea, PND, peripheral edema, and claudication. Resp: Denies dyspnea at rest, dyspnea with exercise, cough, sputum, wheezing, coughing up blood, and pleurisy. GI: Denies vomiting blood, jaundice, and fecal incontinence.   Denies dysphagia or odynophagia. GU : Denies urinary burning, blood in urine, urinary frequency, urinary hesitancy, nocturnal urination, and urinary incontinence. MS: Denies joint pain, limitation of movement, and swelling, stiffness, low back pain, extremity pain. Denies muscle weakness, cramps, atrophy.  Derm: Denies rash, itching, dry skin, hives, moles, warts, or unhealing ulcers.  Psych: Denies depression, anxiety, memory loss, suicidal ideation, hallucinations, paranoia, and confusion. Heme: Denies bruising, bleeding, and enlarged lymph nodes. Neuro:  Denies any headaches, dizziness, paresthesia Endo:  Denies any problems with DM, thyroid, adrenal    Studies:   Dg Chest 2 View  08/02/2015  CLINICAL DATA:  Five days of fever, history of ulcerative colitis, nonsmoker. EXAM: CHEST  2 VIEW COMPARISON:  PA and lateral chest x-ray of July 19, 2015 FINDINGS: The lungs are well-expanded. There is no focal infiltrate. There is no pleural effusion. The heart and pulmonary vascularity are normal. The mediastinum is normal in width. There is no pleural effusion. The bony thorax is unremarkable. IMPRESSION: There is no active cardiopulmonary disease. Electronically Signed   By: David  Martinique M.D.   On: 08/02/2015 10:53   Dg Chest 2 View  07/19/2015  CLINICAL DATA:  Positive QuantiFERON-TB Gold test EXAM: CHEST  2 VIEW COMPARISON:  None. FINDINGS: The heart size and mediastinal contours are within normal limits. Both lungs are clear. The visualized skeletal structures are unremarkable. IMPRESSION: No active cardiopulmonary disease. Electronically Signed   By: Skipper Cliche M.D.   On: 07/19/2015 11:23   Ct Pelvis W  Contrast  07/24/2015  CLINICAL DATA:  Worsening diarrhea and abdominal pain. Tenesmus. Perirectal pain. Hemorrhoidal disease. Assessment for perirectal abscess. EXAM: CT PELVIS WITH CONTRAST TECHNIQUE: Multidetector CT imaging of the pelvis was performed using the standard protocol following the bolus administration of intravenous contrast. CONTRAST:  112mL OMNIPAQUE IOHEXOL 300 MG/ML  SOLN COMPARISON:  07/17/2015 FINDINGS: Lower Urinary Tract: Bladder and distal ureters unremarkable. Bowel: Appendiceal diameter 7 mm, within normal limits. Mild apparent wall thickening in the descending colon, sigmoid colon, and rectum, as well as the bottom of the cecum. In the right perianal region but primarily contained within the external sphincter, we demonstrate a 2.4 by 2.4 by 1.9 cm fluid collection with enhancing margins favoring a perianal abscess. No internal gas. It is possible it a component of this fluid collection extends within the external sphincter and possibly even slightly beyond the external sphincter given the appearance on image 56 series 602. Vascular/Lymphatic: Unremarkable Reproductive: Unremarkable Other: Small amount of free pelvic fluid, image 25 series 2, abnormal. 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 IMPRESSION: 1. Newly apparent right eccentric perianal abscess, 2.4 cm, tracking along and possibly extending mildly beyond the external sphincter. 2. Wall thickening in the visualized colon in the pelvis, favoring a diffuse colitis. 3. Small amount of free pelvic fluid, nonspecific. 4. 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 Electronically Signed   By: Van Clines M.D.   On: 07/24/2015 14:37   US Abdomen Complete  07/18/2015  CLINICAL DATA:  Abnormal LFTs. Abdominal cramping for 1 week. History of GERD and ulcerative colitis. EXAM:  ULTRASOUND ABDOMEN COMPLETE COMPARISON:  CT of the abdomen and pelvis on 07/17/2015 FINDINGS: Gallbladder: There is a small amount of debris within the gallbladder. Gallbladder wall is normal in thickness, 1.4 mm. No sonographic Murphy sign, pericholecystic fluid, or stones. Common bile duct: Diameter: 5.4 mm Liver: The liver is echogenic. There is attenuation of the ultrasound wave, poor visualization of the internal hepatic architecture, and loss of definition of the diaphragm. Focal sparing of fatty infiltration adjacent to the gallbladder fossa. No other focal abnormalities is identified within the liver. IVC: No abnormality visualized. Pancreas: Visualized portion unremarkable. Spleen: Size and appearance within normal limits. Right Kidney: Length: 11.3 cm. Echogenicity within normal limits. No mass or hydronephrosis visualized. Left Kidney: Length: 12.8 cm. Echogenicity within normal limits. No mass or hydronephrosis visualized. Abdominal aorta: 2.2 cm Other findings: None. IMPRESSION: 1. No ultrasound evidence for acute cholecystitis. Small amount of debris within the gallbladder. 2. Fatty liver. Electronically Signed   By: Nolon Nations M.D.   On: 07/18/2015 13:03   Ct Abdomen Pelvis W Contrast  07/17/2015  CLINICAL DATA:  Newly diagnosed ulcerative colitis. Lower abdominal pain. EXAM: CT ABDOMEN AND PELVIS WITH CONTRAST TECHNIQUE: Multidetector CT imaging of the abdomen and pelvis was performed using the standard protocol following bolus administration of intravenous contrast. CONTRAST:  186mL OMNIPAQUE IOHEXOL 300 MG/ML  SOLN COMPARISON:  None. FINDINGS: Insert basis Liver, gallbladder, spleen, pancreas, adrenals and kidneys are normal. Mild diffuse circumferential wall thickening throughout the entire colon, most pronounced in the cecum and ascending colon. There are small pericolonic lymph nodes diffusely. Appendix is visualized and is normal. Small bowel and stomach are unremarkable. No free  fluid, free air or adenopathy. Urinary bladder is decompressed, grossly unremarkable. Aorta is normal caliber. No acute bony abnormality.  Bilateral L5 pars defects noted. IMPRESSION: Mild diffuse colonic wall thickening, most pronounced in the right colon compatible with patient's recent given history of ulcerative colitis. Small scattered pericolonic lymph nodes. No free air or fluid collection. Appendix is normal. Small bowel is normal. Electronically Signed   By: Rolm Baptise M.D.   On: 07/17/2015 13:38   Dg Abd 2 Views  07/19/2015  CLINICAL DATA:  Abdominal pain and distention for 2 weeks. Nausea. History of ulcerative colitis. EXAM: ABDOMEN - 2 VIEW COMPARISON:  CT abdomen and pelvis 07/17/2015 FINDINGS: There is no bowel obstruction or free air. Mucosal thickening is seen along the transverse colon consistent with the diagnosis and CT findings of colitis The degree of distention appears similar to prior CT. LEFT colon Air-fluid level is observed on the upright radiograph. No osseous findings. No calcifications. Unremarkable osseous structures. IMPRESSION: Findings consistent with mucosal edema of the transverse colon and ileus. Similar appearance to prior CT. No definite obstruction or free air. Electronically Signed   By: Staci Righter M.D.   On: 07/19/2015 11:00     Physical Exam: BP 114/58 mmHg  Pulse  100  Temp(Src) 99 F (37.2 C)  Ht 5\' 5"  (1.651 m)  Wt 190 lb (86.183 kg)  BMI 31.62 kg/m2 General: Pleasant, well developed , Caucasian male in no acute distress Head: Normocephalic and atraumatic Eyes:  sclerae anicteric, conjunctiva pink  Ears: Normal auditory acuity Lungs: Clear throughout to auscultation Heart: Regular rate and rhythm Abdomen: Soft, non distended, non-tender. No masses, no hepatomegaly. Normal bowel sounds Rectal: On the right anterior aspect there is a small surgical defect with a healed skin flap. There is a small right external hemorrhoid. Insertion of the finger  is nontender and there is no fluctuance or tenderness or drainage noted. Perianal exam is otherwise normal. Musculoskeletal: Symmetrical with no gross deformities  Extremities: No edema  Neurological: Alert oriented x 4, grossly nonfocal Psychological:  Alert and cooperative. Normal mood and affect  Assessment and Recommendations: #1. Ulcerative colitis with abscess. This is much improved. He continues to have a low-grade fever with no obvious cause. We will check a CBC and a CRP today. There does not seem to be recurrence of the abscess based on exam today.  #2. C. difficile diarrhea. He is currently on vancomycin 125 mg 4 times a day and florastore 250 mg 2 capsules twice daily. He will continue his current dose of vancomycin until complete but will continue his probiotic for 4-6 weeks after completion of the vancomycin.        Cacey Willow, Vita Barley PA-C 08/08/2015,

## 2015-08-08 NOTE — Patient Instructions (Signed)
We have sent the following medications to your pharmacy for you to pick up at your convenience: Ranitidine 150 mg at bedtime   Finish vancomycin  Continue Florastor.   Thanks for coming in today. Please call back with any problems.

## 2015-08-17 ENCOUNTER — Other Ambulatory Visit (INDEPENDENT_AMBULATORY_CARE_PROVIDER_SITE_OTHER): Payer: Managed Care, Other (non HMO)

## 2015-08-17 DIAGNOSIS — K51914 Ulcerative colitis, unspecified with abscess: Secondary | ICD-10-CM | POA: Diagnosis not present

## 2015-08-17 LAB — CBC WITH DIFFERENTIAL/PLATELET
Basophils Absolute: 0 10*3/uL (ref 0.0–0.1)
Basophils Relative: 0.1 % (ref 0.0–3.0)
Eosinophils Absolute: 0 10*3/uL (ref 0.0–0.7)
Eosinophils Relative: 0.1 % (ref 0.0–5.0)
HCT: 29.4 % — ABNORMAL LOW (ref 39.0–52.0)
Hemoglobin: 9.2 g/dL — ABNORMAL LOW (ref 13.0–17.0)
Lymphocytes Relative: 28 % (ref 12.0–46.0)
Lymphs Abs: 2.7 10*3/uL (ref 0.7–4.0)
MCHC: 31.2 g/dL (ref 30.0–36.0)
MCV: 78.4 fl (ref 78.0–100.0)
Monocytes Absolute: 0.4 10*3/uL (ref 0.1–1.0)
Monocytes Relative: 4.2 % (ref 3.0–12.0)
Neutro Abs: 6.6 10*3/uL (ref 1.4–7.7)
Neutrophils Relative %: 67.6 % (ref 43.0–77.0)
Platelets: 651 10*3/uL — ABNORMAL HIGH (ref 150.0–400.0)
RBC: 3.75 Mil/uL — ABNORMAL LOW (ref 4.22–5.81)
RDW: 22.2 % — ABNORMAL HIGH (ref 11.5–15.5)
WBC: 9.8 10*3/uL (ref 4.0–10.5)

## 2015-08-17 LAB — HIGH SENSITIVITY CRP: CRP, High Sensitivity: 4.05 mg/L (ref 0.000–5.000)

## 2015-08-18 ENCOUNTER — Other Ambulatory Visit: Payer: Self-pay | Admitting: *Deleted

## 2015-08-18 DIAGNOSIS — K51219 Ulcerative (chronic) proctitis with unspecified complications: Secondary | ICD-10-CM

## 2015-08-20 ENCOUNTER — Encounter: Payer: Self-pay | Admitting: Internal Medicine

## 2015-08-20 DIAGNOSIS — A0471 Enterocolitis due to Clostridium difficile, recurrent: Secondary | ICD-10-CM | POA: Insufficient documentation

## 2015-08-28 ENCOUNTER — Other Ambulatory Visit: Payer: Self-pay | Admitting: Internal Medicine

## 2015-08-31 ENCOUNTER — Encounter (HOSPITAL_COMMUNITY): Payer: Managed Care, Other (non HMO)

## 2015-09-02 ENCOUNTER — Telehealth: Payer: Self-pay

## 2015-09-05 ENCOUNTER — Other Ambulatory Visit: Payer: Self-pay | Admitting: Physician Assistant

## 2015-09-05 ENCOUNTER — Telehealth: Payer: Self-pay | Admitting: Family Medicine

## 2015-09-05 NOTE — Telephone Encounter (Signed)
lmom to call back to reschedule his appt that he had with Sarh on 1/11

## 2015-09-08 ENCOUNTER — Other Ambulatory Visit (INDEPENDENT_AMBULATORY_CARE_PROVIDER_SITE_OTHER): Payer: Managed Care, Other (non HMO)

## 2015-09-08 ENCOUNTER — Ambulatory Visit (INDEPENDENT_AMBULATORY_CARE_PROVIDER_SITE_OTHER): Payer: Managed Care, Other (non HMO) | Admitting: Internal Medicine

## 2015-09-08 ENCOUNTER — Encounter: Payer: Self-pay | Admitting: Internal Medicine

## 2015-09-08 VITALS — BP 114/68 | HR 88 | Ht 65.0 in | Wt 204.5 lb

## 2015-09-08 DIAGNOSIS — D62 Acute posthemorrhagic anemia: Secondary | ICD-10-CM | POA: Diagnosis not present

## 2015-09-08 DIAGNOSIS — Z7960 Long term (current) use of unspecified immunomodulators and immunosuppressants: Secondary | ICD-10-CM

## 2015-09-08 DIAGNOSIS — K5100 Ulcerative (chronic) pancolitis without complications: Secondary | ICD-10-CM | POA: Diagnosis not present

## 2015-09-08 DIAGNOSIS — Z79899 Other long term (current) drug therapy: Secondary | ICD-10-CM

## 2015-09-08 DIAGNOSIS — Z23 Encounter for immunization: Secondary | ICD-10-CM | POA: Diagnosis not present

## 2015-09-08 DIAGNOSIS — A0471 Enterocolitis due to Clostridium difficile, recurrent: Secondary | ICD-10-CM

## 2015-09-08 DIAGNOSIS — A047 Enterocolitis due to Clostridium difficile: Secondary | ICD-10-CM

## 2015-09-08 LAB — CBC WITH DIFFERENTIAL/PLATELET
Basophils Absolute: 0.1 10*3/uL (ref 0.0–0.1)
Basophils Relative: 0.4 % (ref 0.0–3.0)
Eosinophils Absolute: 0 10*3/uL (ref 0.0–0.7)
Eosinophils Relative: 0.1 % (ref 0.0–5.0)
HCT: 39.4 % (ref 39.0–52.0)
Hemoglobin: 12.6 g/dL — ABNORMAL LOW (ref 13.0–17.0)
Lymphocytes Relative: 19.5 % (ref 12.0–46.0)
Lymphs Abs: 2.9 10*3/uL (ref 0.7–4.0)
MCHC: 32 g/dL (ref 30.0–36.0)
MCV: 84.3 fl (ref 78.0–100.0)
Monocytes Absolute: 0.7 10*3/uL (ref 0.1–1.0)
Monocytes Relative: 4.9 % (ref 3.0–12.0)
Neutro Abs: 11.2 10*3/uL — ABNORMAL HIGH (ref 1.4–7.7)
Neutrophils Relative %: 75.1 % (ref 43.0–77.0)
Platelets: 380 10*3/uL (ref 150.0–400.0)
RBC: 4.68 Mil/uL (ref 4.22–5.81)
RDW: 23.5 % — ABNORMAL HIGH (ref 11.5–15.5)
WBC: 14.9 10*3/uL — ABNORMAL HIGH (ref 4.0–10.5)

## 2015-09-08 LAB — FERRITIN: Ferritin: 25.2 ng/mL (ref 22.0–322.0)

## 2015-09-08 NOTE — Patient Instructions (Signed)
  Take your prednisone as follows: 10mg  tablet daily for 2 weeks, then 5mg  tablet daily for 2 weeks then stop.   Your physician has requested that you go to the basement for the following lab work before leaving today: CBC/diff, Ferritin   I appreciate the opportunity to care for you. Silvano Rusk, MD, [facility]

## 2015-09-08 NOTE — Assessment & Plan Note (Addendum)
Doing well at this time - continue Humira See me 4 mos

## 2015-09-08 NOTE — Assessment & Plan Note (Addendum)
Cbc, ferritin Stay on ferrous sulfate

## 2015-09-08 NOTE — Progress Notes (Signed)
   Subjective:    Patient ID: John [name], male    DOB: 07-Feb-1981, 34 y.o.   MRN: [id number] Cc: f/u ulcerative colitis, anemia and prior C diff HPI Doing well at this time - back to work. Some fatigue. No bleeding or diarrhea and no pain. No problems with Humira.  Medications, allergies, past medical history, past surgical history, family history and social history are reviewed and updated in the EMR.  Review of Systems As above    Objective:   Physical Exam @BP  114/68 mmHg  Pulse 88  Ht 5' 5"  (1.651 m)  Wt 204 lb 8 oz (92.761 kg)  BMI 34.03 kg/m2@  General:  NAD Eyes:   anicteric Lungs:  clear Heart::  S1S2 no rubs, murmurs or gallops Abdomen:  soft and nontender, BS+ Ext:   no edema, cyanosis or clubbing    Data Reviewed:  Prior GI notes, labs and hosp notes from 2016 and flex sig     Assessment & Plan:  Acute blood loss anemia Cbc, ferritin Stay on ferrous sulfate   Ulcerative colitis ([place]) Doing well at this time - continue Humira See me 4 mos  Recurrent colitis due to Clostridium difficile resolved  Long-term use of immunosuppressant medication - Humira Influenza vaccine Pneumonia vaccine will be done in futire

## 2015-09-10 NOTE — Assessment & Plan Note (Signed)
Influenza vaccine Pneumonia vaccine will be done in futire

## 2015-09-10 NOTE — Assessment & Plan Note (Signed)
resolved 

## 2015-09-11 ENCOUNTER — Other Ambulatory Visit: Payer: Self-pay

## 2015-09-11 DIAGNOSIS — K51214 Ulcerative (chronic) proctitis with abscess: Secondary | ICD-10-CM

## 2015-09-11 NOTE — Progress Notes (Signed)
Quick Note:  Please let wife know would stay on iron can go to qd but things much better Lets repeat ferritin and Hgb in 3 months ______

## 2015-10-16 IMAGING — US US ABDOMEN COMPLETE
1 series · 13 of 25 positions shown · non-contrast
Comparison: CT of the abdomen and pelvis on 07/17/2015

CLINICAL DATA: Abnormal LFTs. Abdominal cramping for 1 week.
History of GERD and ulcerative colitis.

EXAM:
ULTRASOUND ABDOMEN COMPLETE

[Series 1: us abdomen complete · 0.25mm/px · 13 of 95 slices shown]
[im 1/95]
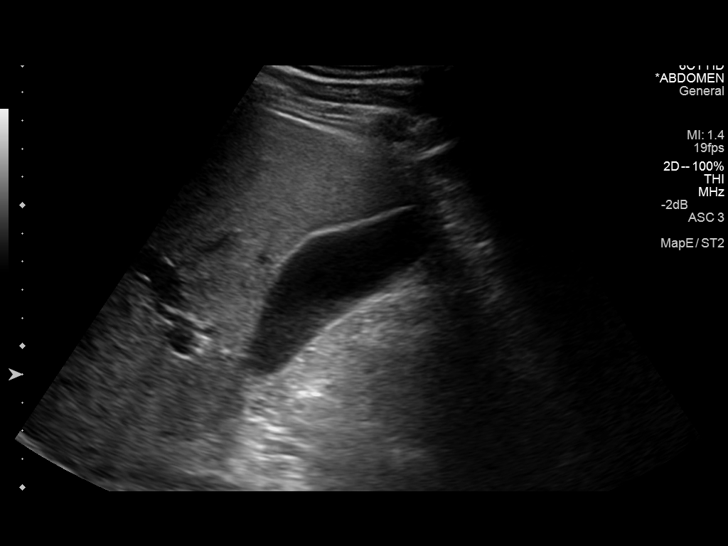
[im 8/95]
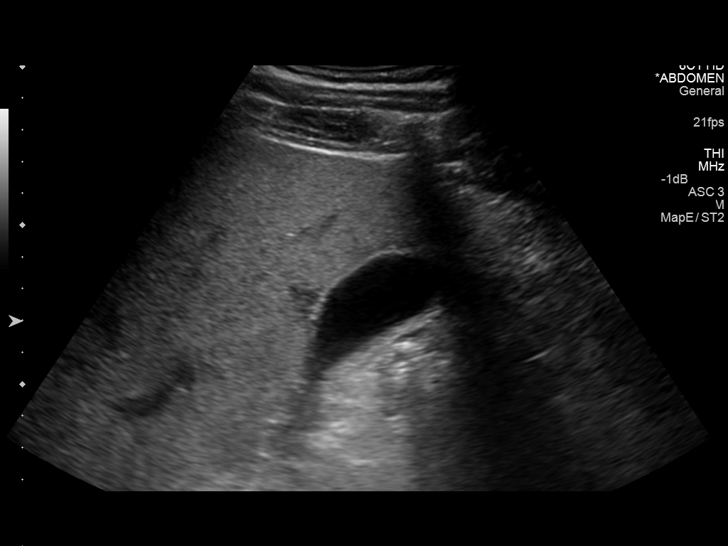
[im 16/95]
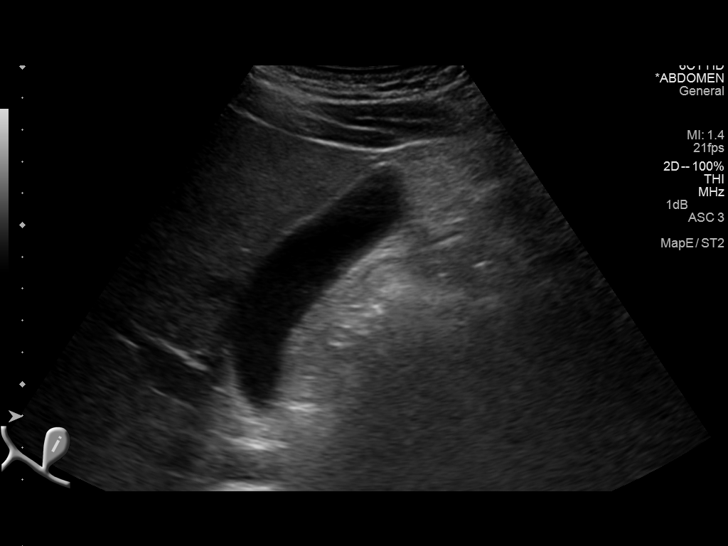
[im 24/95]
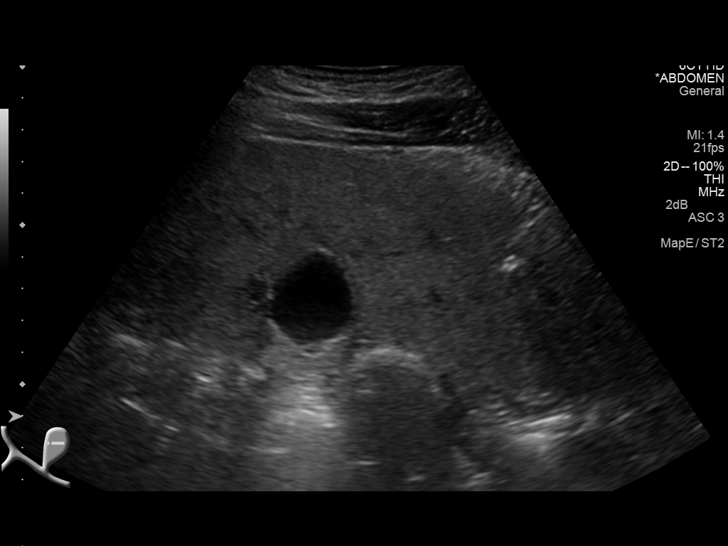
[im 32/95]
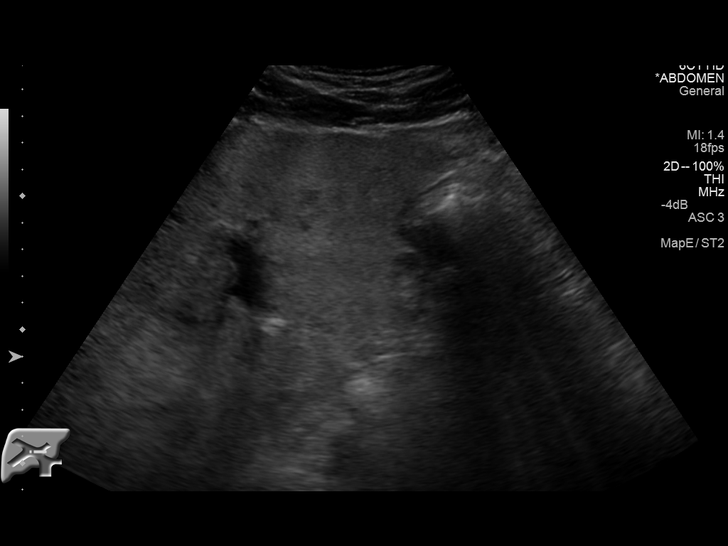
[im 40/95]
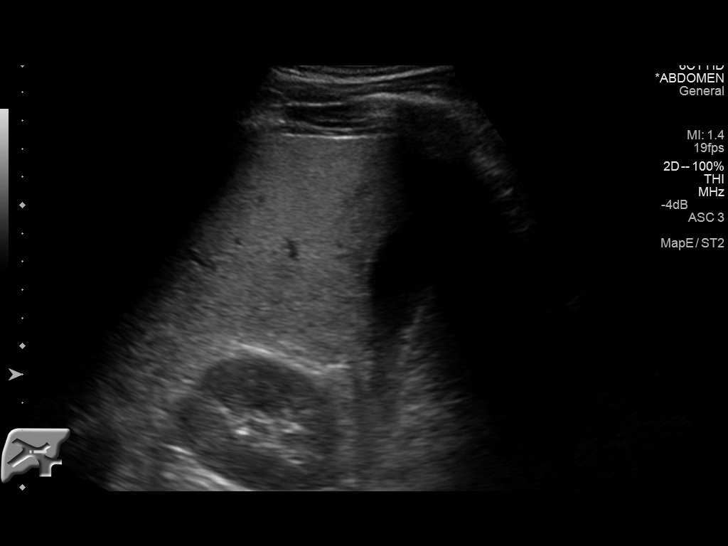
[im 48/95]
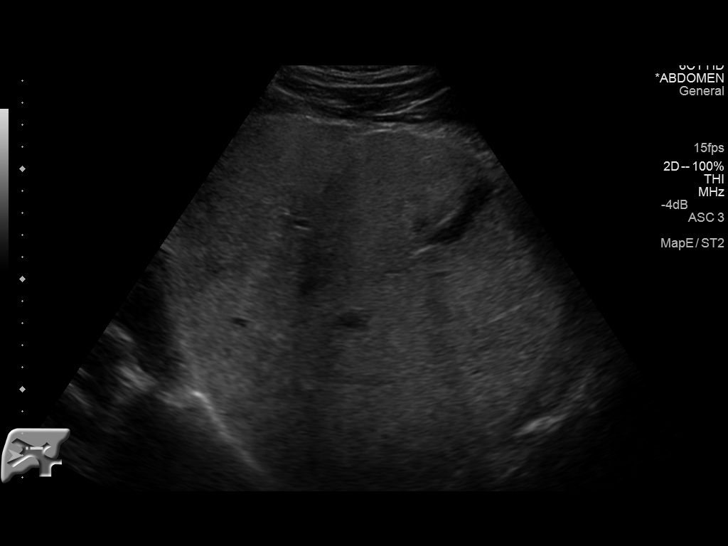
[im 55/95]
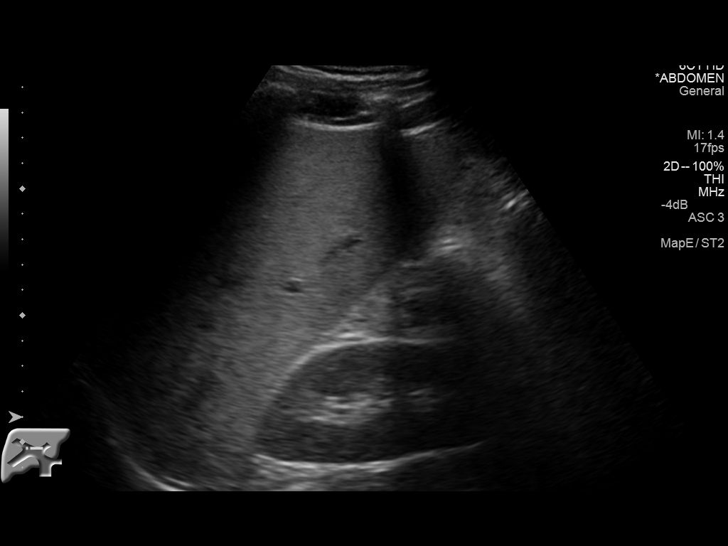
[im 63/95]
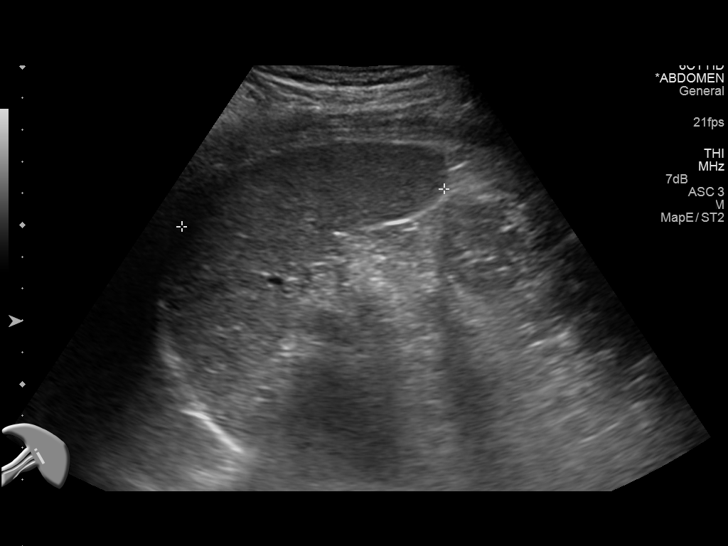
[im 71/95]
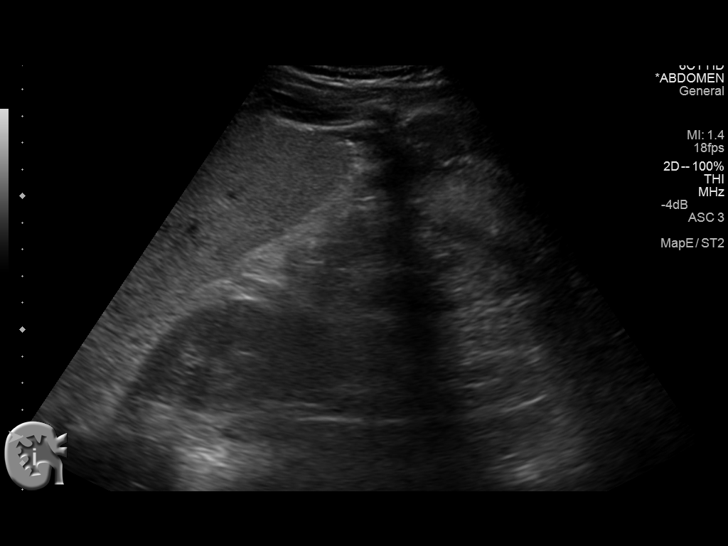
[im 79/95]
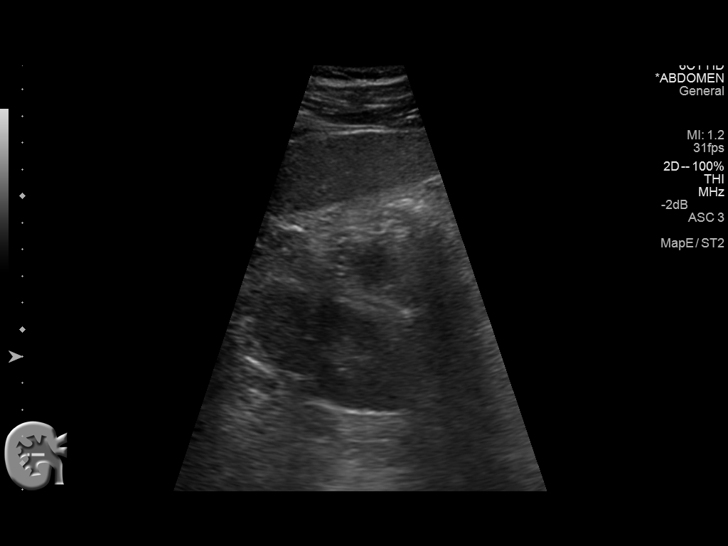
[im 87/95]
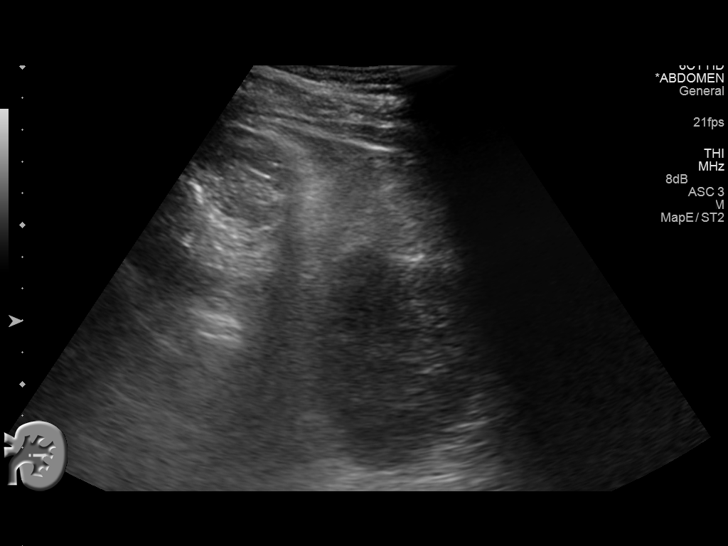
[im 95/95]
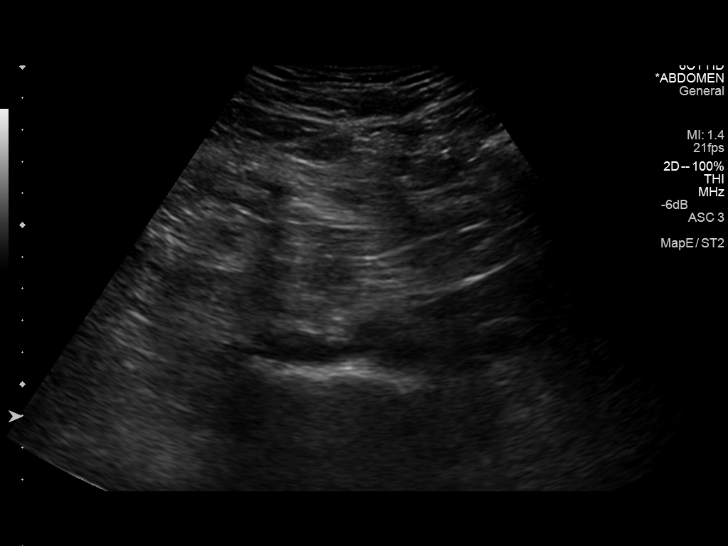

[13 of 25 positions shown; findings below may reference images not displayed]

FINDINGS: Gallbladder: There is a small amount of debris within the
gallbladder. Gallbladder wall is normal in thickness, 1.4 mm. No
sonographic Murphy sign, pericholecystic fluid, or stones.

Common bile duct: Diameter: 5.4 mm

Liver: The liver is echogenic. There is attenuation of the
ultrasound wave, poor visualization of the internal hepatic
architecture, and loss of definition of the diaphragm. Focal sparing
of fatty infiltration adjacent to the gallbladder fossa. No other
focal abnormalities is identified within the liver.

IVC: No abnormality visualized.

Pancreas: Visualized portion unremarkable.

Spleen: Size and appearance within normal limits.

Right Kidney: Length: 11.3 cm. Echogenicity within normal limits. No
mass or hydronephrosis visualized.

Left Kidney: Length: 12.8 cm. Echogenicity within normal limits. No
mass or hydronephrosis visualized.

Abdominal aorta: 2.2 cm

Other findings: None.
IMPRESSION: 1. No ultrasound evidence for acute cholecystitis. Small amount of
debris within the gallbladder.
2. Fatty liver.

## 2015-10-26 ENCOUNTER — Encounter (HOSPITAL_COMMUNITY): Payer: Managed Care, Other (non HMO)

## 2015-10-27 ENCOUNTER — Encounter: Payer: Self-pay | Admitting: Internal Medicine

## 2015-10-27 ENCOUNTER — Ambulatory Visit (INDEPENDENT_AMBULATORY_CARE_PROVIDER_SITE_OTHER): Payer: Managed Care, Other (non HMO) | Admitting: Internal Medicine

## 2015-10-27 VITALS — BP 130/70 | HR 80 | Ht 65.0 in | Wt 218.0 lb

## 2015-10-27 DIAGNOSIS — K51018 Ulcerative (chronic) pancolitis with other complication: Secondary | ICD-10-CM | POA: Diagnosis not present

## 2015-10-27 DIAGNOSIS — K600 Acute anal fissure: Secondary | ICD-10-CM | POA: Diagnosis not present

## 2015-10-27 HISTORY — DX: Acute anal fissure: K60.0

## 2015-10-27 MED ORDER — METRONIDAZOLE 500 MG PO TABS: 500.0000 mg | ORAL_TABLET | Freq: Three times a day (TID) | ORAL | Status: DC

## 2015-10-27 MED ORDER — DILTIAZEM GEL 2 %: 1.0000 "application " | Freq: Two times a day (BID) | CUTANEOUS | Status: DC

## 2015-10-27 NOTE — Progress Notes (Signed)
   Subjective:    Patient ID: John [name], male    DOB: 19-Apr-1981, 35 y.o.   MRN: [id number] Chief Complaint: Rectal pain HPI John has been having about 2-3 days of rectal pain. Descending pain with defecation and afterwards mainly but today he's had more of a steady pain. He has a history of a perirectal abscess and is concerned that he might be getting one again. His bowel movements range from soft to formed but is not really having diarrhea he has been doing well on his Humira. No rectal bleeding. Does not recall any particular large hard stools or anything like that. Medications, allergies, past medical history, past surgical history, family history and social history are reviewed and updated in the EMR.   Review of Systems As above    Objective:   Physical Exam BP 130/70 mmHg  Pulse 80  Ht 5' 5"  (1.651 m)  Wt 218 lb (98.884 kg)  BMI 36.28 kg/m2  Rectal - NL anoderm - no signs of abscess on palpation DRE indurated and tender posterior w/ spasm  Applied 0.125% NTG and 5% lidocaine Felt better Unable to tolerate anoscopy    Assessment & Plan:  Acute anal fissure I think this is what is going on. I suppose it is possible his developing an abscess as well but not sure. I'm going to treat with diltiazem gel, also cover with antibiotics, given his history of C. difficile will use metronidazole for 7 days 500 mg 3 times a day. He will keep me posted as to how he is doing. We will sort out follow-up. If he worsens he knows to call back. If there are persistent questions, he would probably need an exam under anesthesia by surgery.  Ulcerative colitis ([place]) Doing well on Humira. Continue that.

## 2015-10-27 NOTE — Assessment & Plan Note (Signed)
Doing well on Humira. Continue that.

## 2015-10-27 NOTE — Assessment & Plan Note (Signed)
I think this is what is going on. I suppose it is possible his developing an abscess as well but not sure. I'm going to treat with diltiazem gel, also cover with antibiotics, given his history of C. difficile will use metronidazole for 7 days 500 mg 3 times a day. He will keep me posted as to how he is doing. We will sort out follow-up. If he worsens he knows to call back. If there are persistent questions, he would probably need an exam under anesthesia by surgery.

## 2015-10-27 NOTE — Patient Instructions (Signed)
   We have sent the medications to [facility] for you to pick up at your convenience.    I appreciate the opportunity to care for you. Silvano Rusk, MD, [facility]

## 2015-11-01 ENCOUNTER — Ambulatory Visit: Payer: Self-pay | Admitting: Physician Assistant

## 2015-11-13 ENCOUNTER — Other Ambulatory Visit: Payer: Self-pay | Admitting: Physician Assistant

## 2015-12-14 ENCOUNTER — Ambulatory Visit: Payer: Managed Care, Other (non HMO) | Admitting: Physician Assistant

## 2015-12-21 ENCOUNTER — Ambulatory Visit: Payer: Managed Care, Other (non HMO) | Admitting: Physician Assistant

## 2016-01-11 ENCOUNTER — Ambulatory Visit: Payer: Managed Care, Other (non HMO) | Admitting: Physician Assistant

## 2016-01-15 ENCOUNTER — Other Ambulatory Visit (INDEPENDENT_AMBULATORY_CARE_PROVIDER_SITE_OTHER): Payer: Managed Care, Other (non HMO)

## 2016-01-15 ENCOUNTER — Encounter: Payer: Self-pay | Admitting: Internal Medicine

## 2016-01-15 ENCOUNTER — Ambulatory Visit (INDEPENDENT_AMBULATORY_CARE_PROVIDER_SITE_OTHER): Payer: Managed Care, Other (non HMO) | Admitting: Internal Medicine

## 2016-01-15 VITALS — BP 124/64 | HR 96 | Ht 65.0 in | Wt 222.1 lb

## 2016-01-15 DIAGNOSIS — Z79899 Other long term (current) drug therapy: Secondary | ICD-10-CM

## 2016-01-15 DIAGNOSIS — K51214 Ulcerative (chronic) proctitis with abscess: Secondary | ICD-10-CM

## 2016-01-15 DIAGNOSIS — K51011 Ulcerative (chronic) pancolitis with rectal bleeding: Secondary | ICD-10-CM | POA: Diagnosis not present

## 2016-01-15 DIAGNOSIS — Z7960 Long term (current) use of unspecified immunomodulators and immunosuppressants: Secondary | ICD-10-CM

## 2016-01-15 LAB — CBC WITH DIFFERENTIAL/PLATELET
Basophils Absolute: 0 10*3/uL (ref 0.0–0.1)
Basophils Relative: 0.6 % (ref 0.0–3.0)
Eosinophils Absolute: 0.2 10*3/uL (ref 0.0–0.7)
Eosinophils Relative: 3 % (ref 0.0–5.0)
HCT: 38.7 % — ABNORMAL LOW (ref 39.0–52.0)
Hemoglobin: 13.3 g/dL (ref 13.0–17.0)
Lymphocytes Relative: 36.2 % (ref 12.0–46.0)
Lymphs Abs: 2.6 10*3/uL (ref 0.7–4.0)
MCHC: 34.3 g/dL (ref 30.0–36.0)
MCV: 87.9 fl (ref 78.0–100.0)
Monocytes Absolute: 0.7 10*3/uL (ref 0.1–1.0)
Monocytes Relative: 10.3 % (ref 3.0–12.0)
Neutro Abs: 3.6 10*3/uL (ref 1.4–7.7)
Neutrophils Relative %: 49.9 % (ref 43.0–77.0)
Platelets: 340 10*3/uL (ref 150.0–400.0)
RBC: 4.4 Mil/uL (ref 4.22–5.81)
RDW: 14.1 % (ref 11.5–15.5)
WBC: 7.2 10*3/uL (ref 4.0–10.5)

## 2016-01-15 LAB — C-REACTIVE PROTEIN: CRP: 0.6 mg/dL (ref 0.5–20.0)

## 2016-01-15 NOTE — Assessment & Plan Note (Signed)
No problems 

## 2016-01-15 NOTE — Assessment & Plan Note (Addendum)
CBC, CRP, Ferritin Not sure his sxs are from this or IBS + hemorrhoids Will check ADA levels and Abs also Could need flex sig/colonoscopy to reassess

## 2016-01-15 NOTE — Patient Instructions (Signed)
  Your physician has requested that you go to the basement for the  lab work before leaving today.   I appreciate the opportunity to care for you.

## 2016-01-16 ENCOUNTER — Other Ambulatory Visit: Payer: Self-pay

## 2016-01-16 ENCOUNTER — Encounter: Payer: Self-pay | Admitting: Internal Medicine

## 2016-01-16 DIAGNOSIS — K51218 Ulcerative (chronic) proctitis with other complication: Secondary | ICD-10-CM

## 2016-01-16 LAB — FERRITIN: Ferritin: 36.4 ng/mL (ref 22.0–322.0)

## 2016-01-16 NOTE — Progress Notes (Signed)
   Subjective:    Patient ID: John [name], male    DOB: 1980/11/11, 35 y.o.   MRN: [id number] Cc: f/u ulcerative colitis HPI Overall better but has fairly frequent explosive loose stools usu at night and sometimes with rectal bleeding. Otherwise formed stools x 1-2 in AM. Tolerating work ok but restarted softball and did feel deconditioned with practice.  Recent GI illness in kids but sxs predate.  Rectal pain Tx w/metronidazole and diltiazem gel in 10/2015 resolved quickly and he is not still on diltiazem gel.  Medications, allergies, past medical history, past surgical history, family history and social history are reviewed and updated in the EMR.  Review of Systems As above    Objective:   Physical Exam @BP  124/64 mmHg  Pulse 96  Ht 5' 5"  (1.651 m)  Wt 222 lb 2 oz (100.755 kg)  BMI 36.96 kg/m2@  General:  NAD Eyes:   anicteric Lungs:  clear Heart:: S1S2 no rubs, murmurs or gallops Abdomen:  soft and nontender, BS+ Ext:   no edema, cyanosis or clubbing        Assessment & Plan:  Ulcerative colitis (H[facility]) CBC, CRP, Ferritin Not sure his sxs are from this or IBS + hemorrhoids Will check ADA levels and Abs also Could need flex sig/colonoscopy to reassess  Long-term use of immunosuppressant medication - Humira No problems

## 2016-01-17 ENCOUNTER — Other Ambulatory Visit: Payer: Self-pay | Admitting: Internal Medicine

## 2016-01-17 NOTE — Progress Notes (Signed)
Quick Note:  Let patient/wife know labs ok - CRP low - good sign Waiting on the Anser ADA from [facility] His ferritin is normal but still on the low side so stay on iron supplement - he will feel stronger when ferritin gets > 100  Once I get Anser ADA results we can decide next steps which could be restarting Lialda, adding 6 MP (did not discuss yet), VSL #3?  Cc: to patient also (I printed) ______

## 2016-01-18 LAB — PROMETHEUS-MAIL

## 2016-01-25 NOTE — Progress Notes (Signed)
Quick Note:  No antibodies and a detectable level of Humira so that is good news  His current sxs could be IBS or could still be healing or both.  1) Restart Lialda 2.4 g daily - he has at home per Dottie 2) RTC about 2 months 3) Anticipate rescoping this summer   ______

## 2016-01-30 ENCOUNTER — Other Ambulatory Visit: Payer: Self-pay | Admitting: Physician Assistant

## 2016-02-06 ENCOUNTER — Telehealth: Payer: Self-pay

## 2016-02-06 NOTE — Telephone Encounter (Signed)
Patient called to report bleeding with lialda.  He stopped it for several days and all bleeding has now resolved.  Patient also reports a hive reaction today to amoxicillin that was prescribed for a sinus infection.  Hives to arms and trunk. He was evaluated today at Urgent Care and given "an injection or prednisone and prescribed a antihistamine"   I have updated his allergy list.

## 2016-02-15 ENCOUNTER — Encounter: Payer: Self-pay | Admitting: Internal Medicine

## 2016-02-26 ENCOUNTER — Other Ambulatory Visit: Payer: Self-pay

## 2016-02-26 MED ORDER — ADALIMUMAB 40 MG/0.8ML ~~LOC~~ AJKT: 40.0000 mg | AUTO-INJECTOR | SUBCUTANEOUS | Status: DC

## 2016-03-22 ENCOUNTER — Other Ambulatory Visit: Payer: Self-pay

## 2016-03-22 MED ORDER — LEVOTHYROXINE SODIUM 25 MCG PO TABS: ORAL_TABLET | ORAL | Status: DC

## 2016-04-02 ENCOUNTER — Encounter: Payer: Self-pay | Admitting: Internal Medicine

## 2016-04-02 ENCOUNTER — Ambulatory Visit (INDEPENDENT_AMBULATORY_CARE_PROVIDER_SITE_OTHER): Payer: Managed Care, Other (non HMO) | Admitting: Internal Medicine

## 2016-04-02 ENCOUNTER — Other Ambulatory Visit (INDEPENDENT_AMBULATORY_CARE_PROVIDER_SITE_OTHER): Payer: Managed Care, Other (non HMO)

## 2016-04-02 VITALS — BP 114/78 | HR 72 | Ht 65.0 in | Wt 221.1 lb

## 2016-04-02 DIAGNOSIS — Z79899 Other long term (current) drug therapy: Secondary | ICD-10-CM

## 2016-04-02 DIAGNOSIS — K5100 Ulcerative (chronic) pancolitis without complications: Secondary | ICD-10-CM

## 2016-04-02 DIAGNOSIS — D500 Iron deficiency anemia secondary to blood loss (chronic): Secondary | ICD-10-CM

## 2016-04-02 DIAGNOSIS — Z7960 Long term (current) use of unspecified immunomodulators and immunosuppressants: Secondary | ICD-10-CM

## 2016-04-02 DIAGNOSIS — K51218 Ulcerative (chronic) proctitis with other complication: Secondary | ICD-10-CM

## 2016-04-02 DIAGNOSIS — Z23 Encounter for immunization: Secondary | ICD-10-CM | POA: Diagnosis not present

## 2016-04-02 LAB — CBC WITH DIFFERENTIAL/PLATELET
Basophils Absolute: 0 10*3/uL (ref 0.0–0.1)
Basophils Relative: 0.1 % (ref 0.0–3.0)
Eosinophils Absolute: 0.3 10*3/uL (ref 0.0–0.7)
Eosinophils Relative: 3.1 % (ref 0.0–5.0)
HCT: 41.8 % (ref 39.0–52.0)
Hemoglobin: 14.4 g/dL (ref 13.0–17.0)
Lymphocytes Relative: 40.2 % (ref 12.0–46.0)
Lymphs Abs: 3.7 10*3/uL (ref 0.7–4.0)
MCHC: 34.4 g/dL (ref 30.0–36.0)
MCV: 86.8 fl (ref 78.0–100.0)
Monocytes Absolute: 0.8 10*3/uL (ref 0.1–1.0)
Monocytes Relative: 8.9 % (ref 3.0–12.0)
Neutro Abs: 4.4 10*3/uL (ref 1.4–7.7)
Neutrophils Relative %: 47.7 % (ref 43.0–77.0)
Platelets: 330 10*3/uL (ref 150.0–400.0)
RBC: 4.81 Mil/uL (ref 4.22–5.81)
RDW: 13 % (ref 11.5–15.5)
WBC: 9.2 10*3/uL (ref 4.0–10.5)

## 2016-04-02 LAB — COMPREHENSIVE METABOLIC PANEL
ALT: 39 U/L (ref 0–53)
AST: 33 U/L (ref 0–37)
Albumin: 4.7 g/dL (ref 3.5–5.2)
Alkaline Phosphatase: 48 U/L (ref 39–117)
BUN: 13 mg/dL (ref 6–23)
CO2: 27 mEq/L (ref 19–32)
Calcium: 9.8 mg/dL (ref 8.4–10.5)
Chloride: 103 mEq/L (ref 96–112)
Creatinine, Ser: 0.97 mg/dL (ref 0.40–1.50)
GFR: 92.52 mL/min (ref >60.00)
Glucose, Bld: 89 mg/dL (ref 70–99)
Potassium: 3.9 mEq/L (ref 3.5–5.1)
Sodium: 139 mEq/L (ref 135–145)
Total Bilirubin: 0.6 mg/dL (ref 0.2–1.2)
Total Protein: 8.2 g/dL (ref 6.0–8.3)

## 2016-04-02 NOTE — Assessment & Plan Note (Signed)
Prevnar vaccine

## 2016-04-02 NOTE — Progress Notes (Signed)
   Subjective:    Patient ID: John [name], male    DOB: 03-15-1981, 35 y.o.   MRN: [id number] Cc: f/u ulcerative colitis HPI Last seen 3 months ago and was having some intermittent diarrhea.CRP was NL, no adalimumab Abs and had detectable drug. Over time these sxs have subsided. I did add back Lialda but he got diarrhhea with that. Anemia has resolved but ferritin has remained low NL.Is on iron supplements. Now ok w/o diarrhea, abd pain.  Wt Readings from Last 3 Encounters:  04/02/16 221 lb 2 oz (100.302 kg)  01/15/16 222 lb 2 oz (100.755 kg)  10/27/15 218 lb (98.884 kg)   He is playing softball again - enjoying that. Medications, allergies, past medical history, past surgical history, family history and social history are reviewed and updated in the EMR.  Review of Systems As above    Objective:   Physical Exam BP 114/78 mmHg  Pulse 72  Ht 5' 5"  (1.651 m)  Wt 221 lb 2 oz (100.302 kg)  BMI 36.80 kg/m2 NAD     Assessment & Plan:   Encounter Diagnoses  Name Primary?  . Ulcerative pancolitis without complication ([place]) Yes  . Long-term use of immunosuppressant medication - Humira   . Anemia due to chronic blood loss     He is improved and clinically in remission - we discussed pros and cons of colonoscopy and have decided to observe clinically at this time Will f/u anemia and ferritin today Administer Prevnar 13 today Needs vitt D checked given IBD dz  Labs back see EMR but Vit D low so 50k wkly x 12 and 1000 IU daily Ferritin in 30's Hgb NL CMEt NL  Will stay on iron  See me 6 months sooner prn  I appreciate the opportunity to care for this patient. TF:[id number] R, MD

## 2016-04-02 NOTE — Assessment & Plan Note (Signed)
In remission clinically RTC 6 months

## 2016-04-02 NOTE — Patient Instructions (Addendum)
   Today you have been given a Prevnar 13 vaccine and an information sheet to read.   Your physician has requested that you go to the basement for the lab work before leaving today.  Normal BMI (Body Mass Index- based on height and weight) is between 19 and 25. Your BMI today is Body mass index is 36.8 kg/(m^2). Marland Kitchen Please consider follow up  regarding your BMI with your Primary Care Provider.  Follow up in 6 months with Dr Carlean Purl.    I appreciate the opportunity to care for you. Silvano Rusk, MD, [facility]

## 2016-04-03 ENCOUNTER — Encounter: Payer: Self-pay | Admitting: Internal Medicine

## 2016-04-03 ENCOUNTER — Other Ambulatory Visit: Payer: Self-pay

## 2016-04-03 DIAGNOSIS — K51214 Ulcerative (chronic) proctitis with abscess: Secondary | ICD-10-CM

## 2016-04-03 DIAGNOSIS — E559 Vitamin D deficiency, unspecified: Secondary | ICD-10-CM | POA: Insufficient documentation

## 2016-04-03 HISTORY — DX: Vitamin D deficiency, unspecified: E55.9

## 2016-04-03 LAB — FERRITIN: Ferritin: 33.1 ng/mL (ref 22.0–322.0)

## 2016-04-03 LAB — VITAMIN D 25 HYDROXY (VIT D DEFICIENCY, FRACTURES): VITD: 22.76 ng/mL — ABNORMAL LOW (ref 30.00–100.00)

## 2016-04-03 MED ORDER — VITAMIN D (ERGOCALCIFEROL) 1.25 MG (50000 UNIT) PO CAPS: 50000.0000 [IU] | ORAL_CAPSULE | ORAL | Status: DC

## 2016-04-03 MED ORDER — VITAMIN D 1000 UNITS PO TABS: 1000.0000 [IU] | ORAL_TABLET | Freq: Every day | ORAL | Status: DC

## 2016-04-03 NOTE — Progress Notes (Signed)
Quick Note:  Also take vit D 1000 IU daily ______

## 2016-04-03 NOTE — Progress Notes (Signed)
Quick Note:  Vit D low needs 50K U weekly x 12 Ferritin nl but not any better , CBC and CMEt NL Stay on iron supps  Repeat CBC, ferritin and vit D level in 6 months ______

## 2016-07-15 ENCOUNTER — Other Ambulatory Visit: Payer: Self-pay | Admitting: Internal Medicine

## 2016-07-15 ENCOUNTER — Other Ambulatory Visit: Payer: Self-pay | Admitting: Physician Assistant

## 2016-07-15 NOTE — Telephone Encounter (Signed)
Refill Sir? 

## 2016-07-17 NOTE — Telephone Encounter (Signed)
Refill not needed

## 2016-07-18 NOTE — Telephone Encounter (Signed)
No refill

## 2016-07-23 ENCOUNTER — Ambulatory Visit (INDEPENDENT_AMBULATORY_CARE_PROVIDER_SITE_OTHER): Payer: Managed Care, Other (non HMO) | Admitting: Family Medicine

## 2016-07-23 VITALS — BP 126/80 | HR 77 | Temp 99.1°F | Resp 17 | Ht 65.5 in | Wt 218.0 lb

## 2016-07-23 DIAGNOSIS — E039 Hypothyroidism, unspecified: Secondary | ICD-10-CM

## 2016-07-23 LAB — TSH: TSH: 3.42 mIU/L (ref 0.40–4.50)

## 2016-07-23 LAB — T4, FREE: Free T4: 1 ng/dL (ref 0.8–1.8)

## 2016-07-23 MED ORDER — LEVOTHYROXINE SODIUM 25 MCG PO TABS: ORAL_TABLET | ORAL | 1 refills | Status: DC

## 2016-07-23 NOTE — Progress Notes (Signed)
Subjective:  By signing my name below, I, Moises Blood, attest that this documentation has been prepared under the direction and in the presence of Merri Ray, MD. Electronically Signed: Moises Blood, [place]. 07/23/2016 , 4:43 PM .  Patient was seen in Room 12 .   Patient ID: John [name], male    DOB: [birth year], 35 y.o.   MRN: [id number] Chief Complaint  Patient presents with  . thyroid issues   HPI John [name] is a 35 y.o. male Follow up for thyroid issues. H/o hypothyroidism and ulcerative colitis.   Hypothyroidism When discussed in Sept 2016, he had only taken medication for a month. He's supposed to be on synthroid 25 mcg qd; #15 were provided on June 2nd with instructions to follow up.   Lab Results  Component Value Date   TSH 2.729 07/11/2015   His last dose of his synthroid was a few months ago. He denies any changes to heat/cold intolerance, changes to skin and hair, unexpected weight loss, or heart palpitations.   Ulcerative colitis He's under the care of Dr. Carlean Purl for his ulcerative colitis.   Work He works as a Games developer at [employer].    Patient Active Problem List   Diagnosis Date Noted  . Vitamin D deficiency 04/03/2016  . Anemia due to chronic blood loss 04/02/2016  . Long-term use of immunosuppressant medication - Humira 07/31/2015  . Ulcerative colitis ([place])   . Hypothyroidism 06/29/2015   Past Medical History:  Diagnosis Date  . Acute anal fissure 10/27/2015  . Esophageal reflux 06/08/2014  . GERD (gastroesophageal reflux disease)   . Hemorrhoids, internal, with bleeding 06/08/2014  . Perirectal abscess 07/31/2015  . 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 UC (ulcerative colitis) ([place])   . Vitamin D deficiency 04/03/2016   Past Surgical History:  Procedure Laterality Date  . ESOPHAGOGASTRODUODENOSCOPY N/A 07/11/2015   Procedure: ESOPHAGOGASTRODUODENOSCOPY (EGD);  Surgeon: Jerene Bears, MD;  Location: [facility];  Service: Gastroenterology;  Laterality: N/A;  . FLEXIBLE SIGMOIDOSCOPY N/A 07/11/2015   Procedure: FLEXIBLE SIGMOIDOSCOPY;  Surgeon: Jerene Bears, MD;  Location: [facility];  Service: Gastroenterology;  Laterality: N/A;  . INCISION AND DRAINAGE PERIRECTAL ABSCESS N/A 07/24/2015   Procedure: IRRIGATION AND DEBRIDEMENT PERIRECTAL ABSCESS;  Surgeon: Alphonsa Overall, MD;  Location: [facility];  Service: General;  Laterality: N/A;  . KNEE ARTHROSCOPY Left    Allergies  Allergen Reactions  . Amoxicillin Hives    Hives to trunk and arms  . Remicade [Infliximab] Anaphylaxis  . Ibuprofen     Lips swelling, rash, legs hurting  . Lialda [Mesalamine] Other (See Comments)    Worsening of rectal bleeding   Prior to Admission medications   Medication Sig Start Date End Date Taking? Authorizing Provider  Adalimumab (HUMIRA PEN) 40 MG/0.8ML PNKT Inject 40 mg into the skin every 14 (fourteen) days. 02/26/16  Yes Gatha Mayer, MD  cholecalciferol (VITAMIN D) 1000 units tablet Take 1 tablet (1,000 Units total) by mouth daily. 04/03/16  Yes Gatha Mayer, MD  ranitidine (ZANTAC) 150 MG tablet Take 1 tablet (150 mg total) by mouth at bedtime. 08/08/15  Yes Lori P Hvozdovic, PA-C  Vitamin D, Ergocalciferol, (DRISDOL) 50000 units CAPS capsule Take 1 capsule (50,000 Units total) by mouth every 7 (seven) days. 04/03/16  Yes Gatha Mayer, MD  ferrous [id number] C-folic acid (TRINSICON / FOLTRIN) capsule Take 1 capsule by  mouth 3 (three) times daily after meals. Patient not taking: Reported on 07/23/2016 07/26/15   Laban Emperor Zehr, PA-C  levothyroxine (SYNTHROID, LEVOTHROID) 25 MCG tablet TAKE 1 TABLET (25 MCG TOTAL) BY MOUTH DAILY BEFORE BREAKFAST. Patient not taking: Reported on 07/23/2016 03/22/16   Mancel Bale, PA-C   Social History   Social History  . Marital status: Married    Spouse name: N/A  . Number of children: 2  . Years of education: N/A   Occupational  History  . Diesel Statistician   Social History Main Topics  . Smoking status: Never Smoker  . Smokeless tobacco: Never Used  . Alcohol use No  . Drug use: No  . Sexual activity: Yes   Other Topics Concern  . 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  Eyes: Negative for visual disturbance.  Respiratory: Negative for cough, chest tightness and shortness of breath.   Cardiovascular: Negative for chest pain, palpitations and leg swelling.  Gastrointestinal: Negative for abdominal pain and blood in stool.  Endocrine: Negative for cold intolerance and heat intolerance.  Neurological: Negative for dizziness, light-headedness and headaches.       Objective:   Physical Exam  Constitutional: He is oriented to person, place, and time. He appears well-developed and well-nourished. No distress.  HENT:  Head: Normocephalic and atraumatic.  Eyes: EOM are normal. Pupils are equal, round, and reactive to light.  Neck: Neck supple.  Fullness of thyroid without a dominant nodule  Cardiovascular: Normal rate.   Pulmonary/Chest: Effort normal. No respiratory distress.  Musculoskeletal: Normal range of motion.  Neurological: He is alert and oriented to person, place, and time.  Skin: Skin is warm and dry.  Psychiatric: He has a normal mood and affect. His behavior is normal.  Nursing note and vitals reviewed.   Vitals:   07/23/16 1552  BP: 126/80  Pulse: 77  Resp: 17  Temp: 99.1 F (37.3 C)  TempSrc: Oral  SpO2: 97%  Weight: 218 lb (98.9 kg)  Height: 5' 5.5" (1.664 m)      Assessment & Plan:    John [name] is a 35 y.o. male Hypothyroidism, unspecified type - Plan: levothyroxine (SYNTHROID, LEVOTHROID) 25 MCG tablet, TSH, T4, Free, TSH  Hypothyroidism,  off medications recently  - Will restart Synthroid at 25 MCG daily, check baseline TSH and free T4. If his TSH/free T4 is normal today, may be able to come off of medications  - if elevated, plan to restart Synthroid at previous dose of 25 MCG daily. Either way return for repeat TSH in 6 weeks.   - Follow-up for physical in 6 months.   - Discussed importance of returning for recheck labs when on medication.  Meds ordered this encounter  Medications  . levothyroxine (SYNTHROID, LEVOTHROID) 25 MCG tablet    Sig: TAKE 1 TABLET (25 MCG TOTAL) BY MOUTH DAILY BEFORE BREAKFAST.    Dispense:  90 tablet    Refill:  1   Patient Instructions    I will check a thyroid level today, and if that level is normal, you may be able to stop the Synthroid as you are on a such low dose. However if the TSH is elevated, plan to remain on Synthroid same  dose as previous.   Either way, return for thyroid testing in 6 weeks. Schedule physical with me within the next 6 months. Let me know if you have any questions in the meantime.    Hypothyroidism Hypothyroidism is a disorder of the thyroid. The thyroid is a large gland that is located in the lower front of the neck. The thyroid releases hormones that control how the body works. With hypothyroidism, the thyroid does not make enough of these hormones. CAUSES Causes of hypothyroidism may include:  Viral infections.  Pregnancy.  Your own defense system (immune system) attacking your thyroid.  Certain medicines.  Birth defects.  Past radiation treatments to your head or neck.  Past treatment with radioactive iodine.  Past surgical removal of part or all of your thyroid.  Problems with the gland that is located in the center of your brain (pituitary). SIGNS AND SYMPTOMS Signs and symptoms of hypothyroidism may include:  Feeling as though you have no energy (lethargy).  Inability to tolerate cold.  Weight gain that is not explained by a change in  diet or exercise habits.  Dry skin.  Coarse hair.  Menstrual irregularity.  Slowing of thought processes.  Constipation.  Sadness or depression. DIAGNOSIS  Your health care provider may diagnose hypothyroidism with blood tests and ultrasound tests. TREATMENT Hypothyroidism is treated with medicine that replaces the hormones that your body does not make. After you begin treatment, it may take several weeks for symptoms to go away. HOME CARE INSTRUCTIONS   Take medicines only as directed by your health care provider.  If you start taking any new medicines, tell your health care provider.  Keep all follow-up visits as directed by your health care provider. This is important. As your condition improves, your dosage needs may change. You will need to have blood tests regularly so that your health care provider can watch your condition. SEEK MEDICAL CARE IF:  Your symptoms do not get better with treatment.  You are taking thyroid replacement medicine and:  You sweat excessively.  You have tremors.  You feel anxious.  You lose weight rapidly.  You cannot tolerate heat.  You have emotional swings.  You have diarrhea.  You feel weak. SEEK IMMEDIATE MEDICAL CARE IF:   You develop chest pain.  You develop an irregular heartbeat.  You develop a rapid heartbeat.   This information is not intended to replace advice given to you by your health care provider. Make sure you discuss any questions you have with your health care provider.   Document Released: 10/07/2005 Document Revised: 10/28/2014 Document Reviewed: 02/22/2014 Elsevier Interactive Patient Education 2016 [employer].   IF you received an x-ray today, you will receive an invoice from [facility]. Please contact [facility] at [phone] with questions or concerns regarding your invoice.   IF you received labwork today, you will receive an invoice from [employer]. Please contact [facility] at [phone] with questions or concerns regarding your invoice.   Our billing staff will not be able to assist you with questions regarding bills from these companies.  You will be contacted with the lab results as soon as they are available. The fastest way to get your results is to activate your My Chart account. Instructions are located on the last page of this paperwork. If you have not heard from [place] regarding the results in 2 weeks, please contact this office.        I personally performed the services described  in this documentation, which was scribed in my presence. The recorded information has been reviewed and considered, and addended by me as needed.   Signed,   Merri Ray, MD [facility].  07/23/16 4:53 PM

## 2016-07-23 NOTE — Patient Instructions (Addendum)
I will check a thyroid level today, and if that level is normal, you may be able to stop the Synthroid as you are on a such low dose. However if the TSH is elevated, plan to remain on Synthroid same dose as previous.   Either way, return for thyroid testing in 6 weeks. Schedule physical with me within the next 6 months. Let me know if you have any questions in the meantime.    Hypothyroidism Hypothyroidism is a disorder of the thyroid. The thyroid is a large gland that is located in the lower front of the neck. The thyroid releases hormones that control how the body works. With hypothyroidism, the thyroid does not make enough of these hormones. CAUSES Causes of hypothyroidism may include:  Viral infections.  Pregnancy.  Your own defense system (immune system) attacking your thyroid.  Certain medicines.  Birth defects.  Past radiation treatments to your head or neck.  Past treatment with radioactive iodine.  Past surgical removal of part or all of your thyroid.  Problems with the gland that is located in the center of your brain (pituitary). SIGNS AND SYMPTOMS Signs and symptoms of hypothyroidism may include:  Feeling as though you have no energy (lethargy).  Inability to tolerate cold.  Weight gain that is not explained by a change in diet or exercise habits.  Dry skin.  Coarse hair.  Menstrual irregularity.  Slowing of thought processes.  Constipation.  Sadness or depression. DIAGNOSIS  Your health care provider may diagnose hypothyroidism with blood tests and ultrasound tests. TREATMENT Hypothyroidism is treated with medicine that replaces the hormones that your body does not make. After you begin treatment, it may take several weeks for symptoms to go away. HOME CARE INSTRUCTIONS   Take medicines only as directed by your health care provider.  If you start taking any new medicines, tell your health care provider.  Keep all follow-up visits as directed by  your health care provider. This is important. As your condition improves, your dosage needs may change. You will need to have blood tests regularly so that your health care provider can watch your condition. SEEK MEDICAL CARE IF:  Your symptoms do not get better with treatment.  You are taking thyroid replacement medicine and:  You sweat excessively.  You have tremors.  You feel anxious.  You lose weight rapidly.  You cannot tolerate heat.  You have emotional swings.  You have diarrhea.  You feel weak. SEEK IMMEDIATE MEDICAL CARE IF:   You develop chest pain.  You develop an irregular heartbeat.  You develop a rapid heartbeat.   This information is not intended to replace advice given to you by your health care provider. Make sure you discuss any questions you have with your health care provider.   Document Released: 10/07/2005 Document Revised: 10/28/2014 Document Reviewed: 02/22/2014 Elsevier Interactive Patient Education 2016 [employer].   IF you received an x-ray today, you will receive an invoice from [facility]. Please contact [facility] at [phone] with questions or concerns regarding your invoice.   IF you received labwork today, you will receive an invoice from [employer]. Please contact [facility] at [phone] with questions or concerns regarding your invoice.   Our billing staff will not be able to assist you with questions regarding bills from these companies.  You will be contacted with the lab results as soon as they are available. The fastest way to get your results is to activate your My Chart account.  Instructions are located on the last page of this paperwork. If you have not heard from [place] regarding the results in 2 weeks, please contact this office.

## 2016-07-28 ENCOUNTER — Encounter: Payer: Self-pay | Admitting: Family Medicine

## 2016-07-30 ENCOUNTER — Encounter: Payer: Self-pay | Admitting: Family Medicine

## 2016-10-01 ENCOUNTER — Other Ambulatory Visit: Payer: Self-pay | Admitting: Internal Medicine

## 2016-10-02 NOTE — Telephone Encounter (Signed)
Refill x6

## 2016-10-02 NOTE — Telephone Encounter (Signed)
Please advise how many refills Sir, thank you. 

## 2016-10-03 ENCOUNTER — Telehealth: Payer: Self-pay

## 2016-10-03 NOTE — Telephone Encounter (Signed)
-----   Message from Marlon Pel, RN sent at 04/03/2016  2:48 PM EDT ----- Needs labs see results 6/14

## 2016-10-03 NOTE — Telephone Encounter (Signed)
Pt aware.

## 2016-10-28 ENCOUNTER — Other Ambulatory Visit (INDEPENDENT_AMBULATORY_CARE_PROVIDER_SITE_OTHER): Payer: Managed Care, Other (non HMO)

## 2016-10-28 ENCOUNTER — Other Ambulatory Visit: Payer: Self-pay

## 2016-10-28 DIAGNOSIS — K51214 Ulcerative (chronic) proctitis with abscess: Secondary | ICD-10-CM

## 2016-10-28 DIAGNOSIS — R7989 Other specified abnormal findings of blood chemistry: Secondary | ICD-10-CM

## 2016-10-28 DIAGNOSIS — R946 Abnormal results of thyroid function studies: Secondary | ICD-10-CM | POA: Diagnosis not present

## 2016-10-28 LAB — CBC WITH DIFFERENTIAL/PLATELET
Basophils Absolute: 0 10*3/uL (ref 0.0–0.1)
Basophils Relative: 0.4 % (ref 0.0–3.0)
Eosinophils Absolute: 0.2 10*3/uL (ref 0.0–0.7)
Eosinophils Relative: 2.4 % (ref 0.0–5.0)
HCT: 40.8 % (ref 39.0–52.0)
Hemoglobin: 14.2 g/dL (ref 13.0–17.0)
Lymphocytes Relative: 31.4 % (ref 12.0–46.0)
Lymphs Abs: 3.1 10*3/uL (ref 0.7–4.0)
MCHC: 34.9 g/dL (ref 30.0–36.0)
MCV: 85.3 fl (ref 78.0–100.0)
Monocytes Absolute: 0.6 10*3/uL (ref 0.1–1.0)
Monocytes Relative: 6.5 % (ref 3.0–12.0)
Neutro Abs: 5.8 10*3/uL (ref 1.4–7.7)
Neutrophils Relative %: 59.3 % (ref 43.0–77.0)
Platelets: 348 10*3/uL (ref 150.0–400.0)
RBC: 4.78 Mil/uL (ref 4.22–5.81)
RDW: 13 % (ref 11.5–15.5)
WBC: 9.7 10*3/uL (ref 4.0–10.5)

## 2016-10-28 LAB — VITAMIN D 25 HYDROXY (VIT D DEFICIENCY, FRACTURES): VITD: 21.63 ng/mL — ABNORMAL LOW (ref 30.00–100.00)

## 2016-10-28 LAB — TSH: TSH: 2.95 u[IU]/mL (ref 0.35–4.50)

## 2016-10-28 LAB — FERRITIN: Ferritin: 62.5 ng/mL (ref 22.0–322.0)

## 2016-10-30 ENCOUNTER — Other Ambulatory Visit: Payer: Self-pay

## 2016-10-30 MED ORDER — VITAMIN D (ERGOCALCIFEROL) 1.25 MG (50000 UNIT) PO CAPS: 50000.0000 [IU] | ORAL_CAPSULE | ORAL | 0 refills | Status: DC

## 2016-10-30 MED FILL — VIT D2 1.25 MG (50,000 UNIT: 1.25 MG | 84 days supply | Qty: 12 | Fill #0

## 2016-10-30 NOTE — Progress Notes (Signed)
CBC and ferritin and TSH ok Vit D still low 50 k u weekly x 12 of vit D See me 2-3 months

## 2017-02-04 DIAGNOSIS — J3089 Other allergic rhinitis: Secondary | ICD-10-CM | POA: Insufficient documentation

## 2017-02-04 DIAGNOSIS — H6993 Unspecified Eustachian tube disorder, bilateral: Secondary | ICD-10-CM | POA: Insufficient documentation

## 2017-02-04 DIAGNOSIS — H6523 Chronic serous otitis media, bilateral: Secondary | ICD-10-CM | POA: Insufficient documentation

## 2017-02-04 DIAGNOSIS — H900 Conductive hearing loss, bilateral: Secondary | ICD-10-CM | POA: Insufficient documentation

## 2017-03-19 ENCOUNTER — Other Ambulatory Visit: Payer: Self-pay

## 2017-03-19 ENCOUNTER — Other Ambulatory Visit: Payer: Self-pay | Admitting: Internal Medicine

## 2017-03-19 MED ORDER — ADALIMUMAB 40 MG/0.8ML ~~LOC~~ AJKT: 40.0000 mg | AUTO-INJECTOR | SUBCUTANEOUS | 5 refills | Status: DC

## 2017-04-09 IMAGING — CR DG ABDOMEN 2V
2 series · 2 of 2 positions shown · non-contrast
Comparison: CT abdomen and pelvis 07/17/2015

CLINICAL DATA: Abdominal pain and distention for 2 weeks. Nausea.
History of ulcerative colitis.

EXAM:
ABDOMEN - 2 VIEW

[w abdomen upright *]
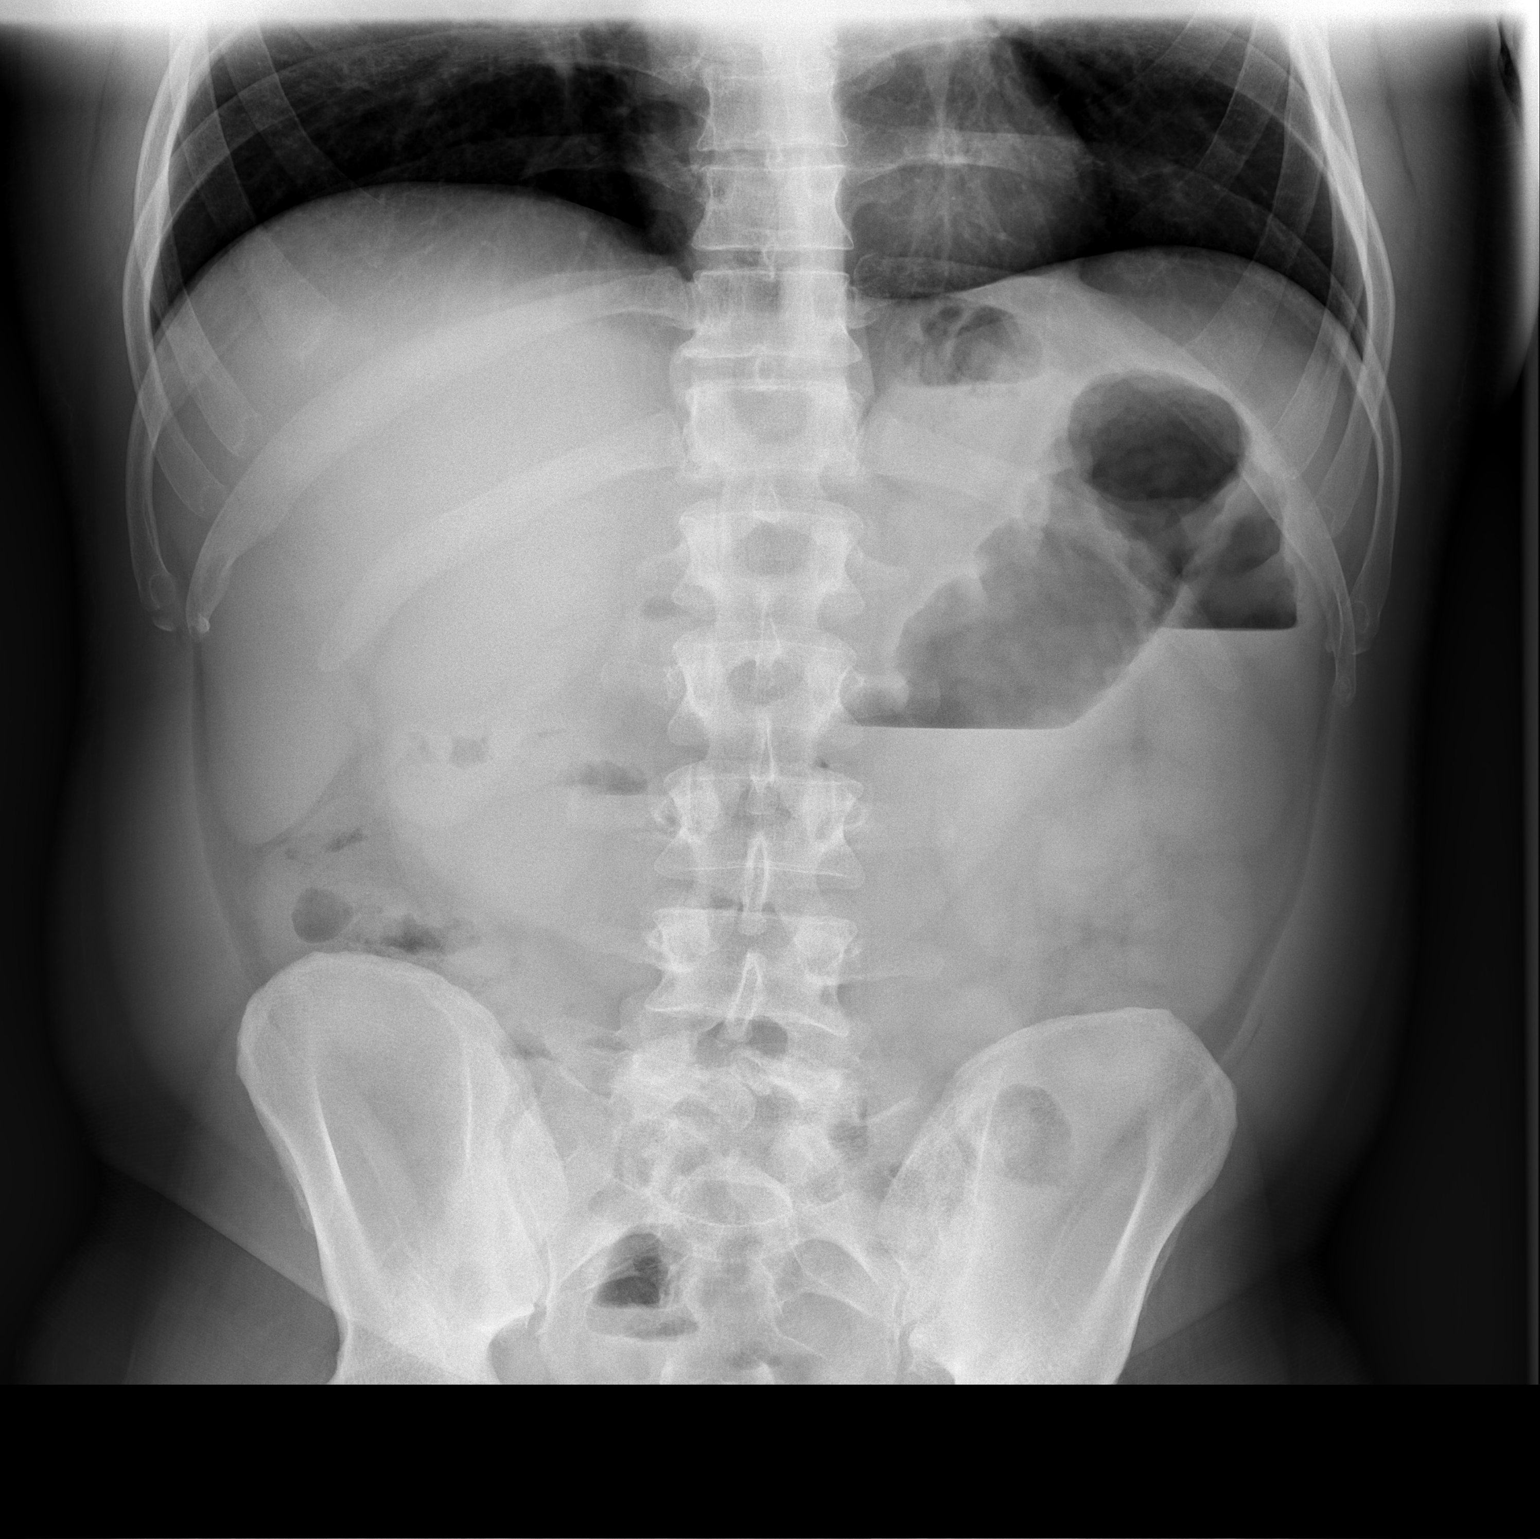

[t abdomen supine]
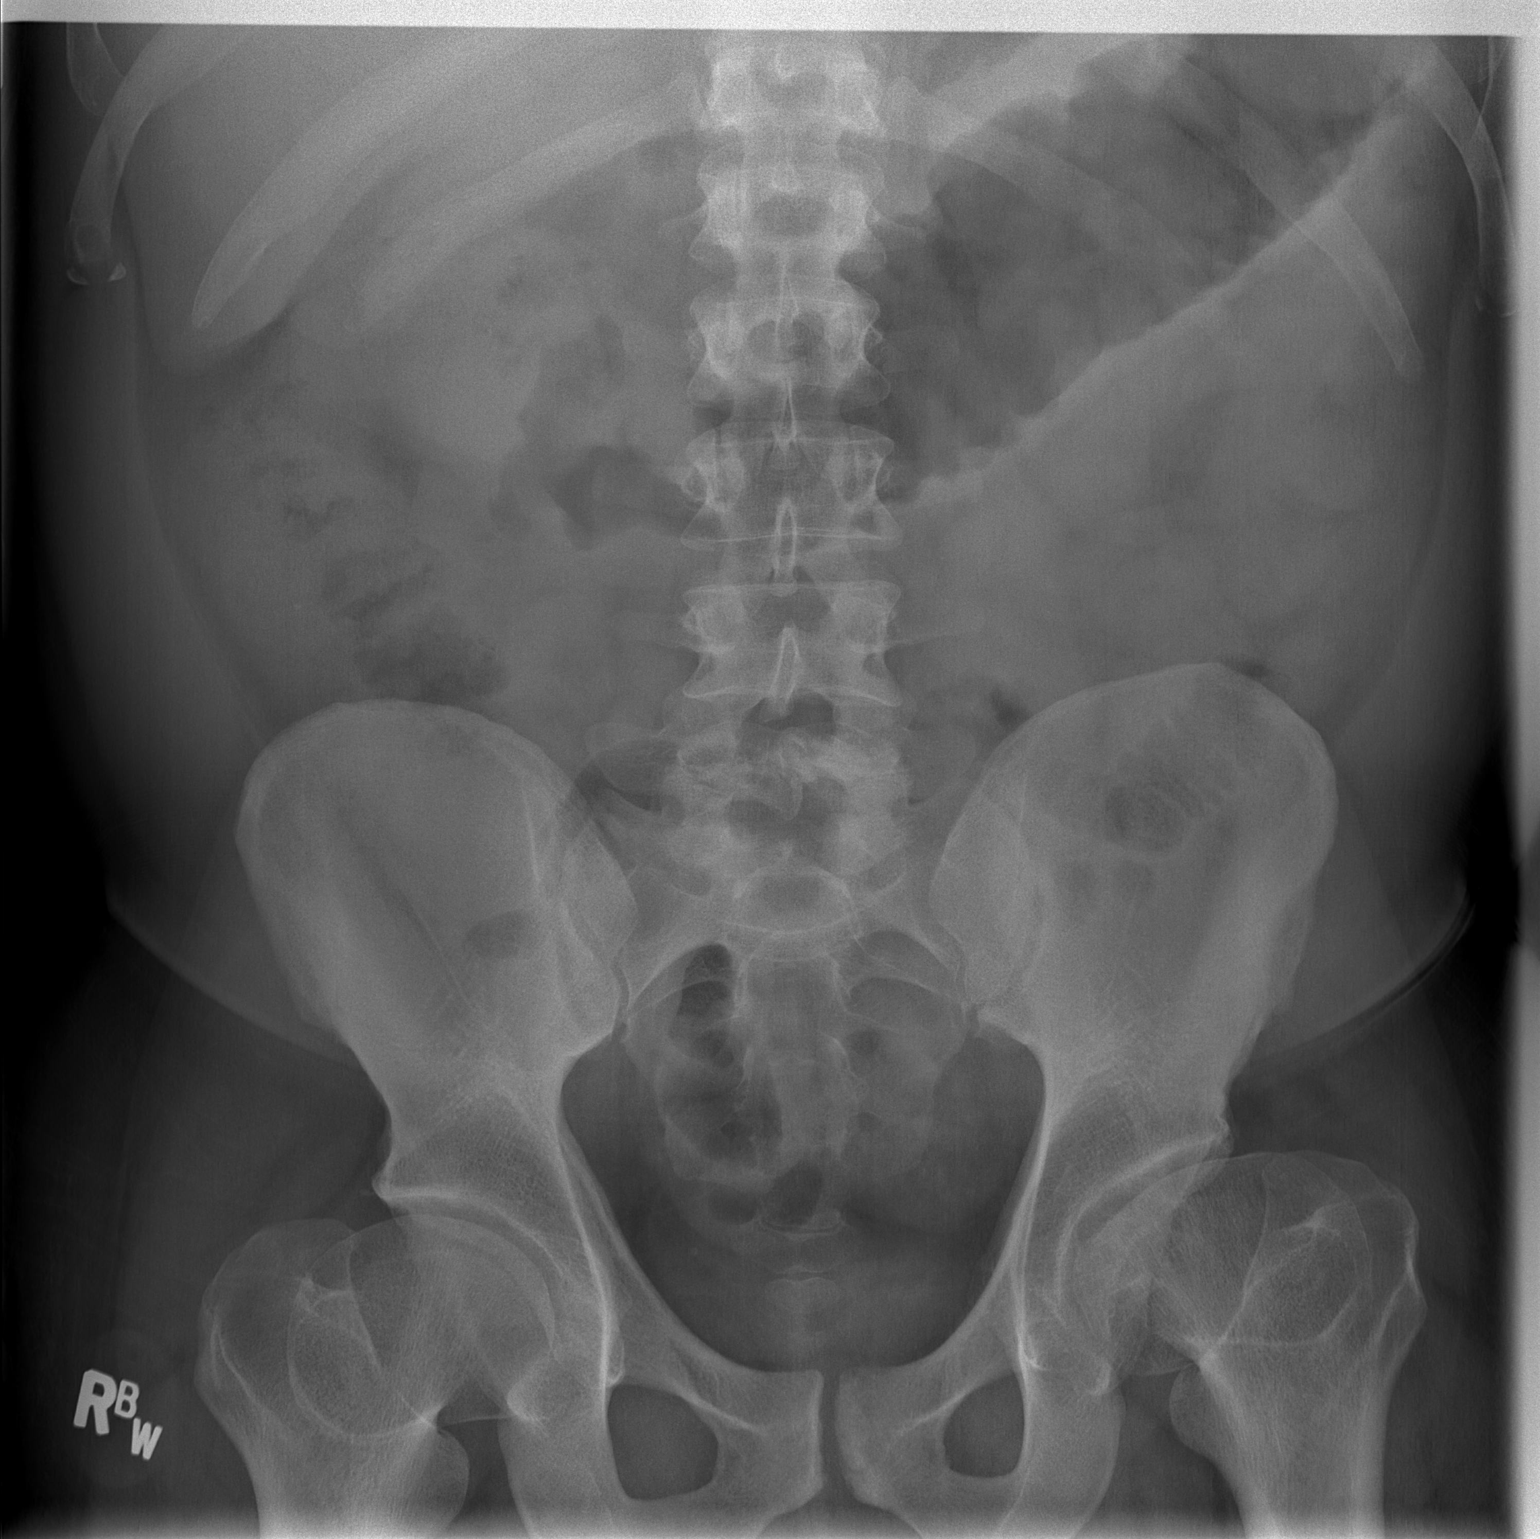

[2 of 2 positions shown; findings below may reference images not displayed]

FINDINGS: There is no bowel obstruction or free air. Mucosal thickening is
seen along the transverse colon consistent with the diagnosis and CT
findings of colitis The degree of distention appears similar to
prior CT. LEFT colon Air-fluid level is observed on the upright
radiograph. No osseous findings. No calcifications. Unremarkable
osseous structures.
IMPRESSION: Findings consistent with mucosal edema of the transverse colon and
ileus. Similar appearance to prior CT. No definite obstruction or
free air.

## 2017-04-09 IMAGING — CR DG CHEST 2V
2 series · 2 of 2 positions shown · non-contrast
Comparison: None.

CLINICAL DATA: Positive QuantiFERON-TB Gold test

EXAM:
CHEST  2 VIEW

[w chest pa]
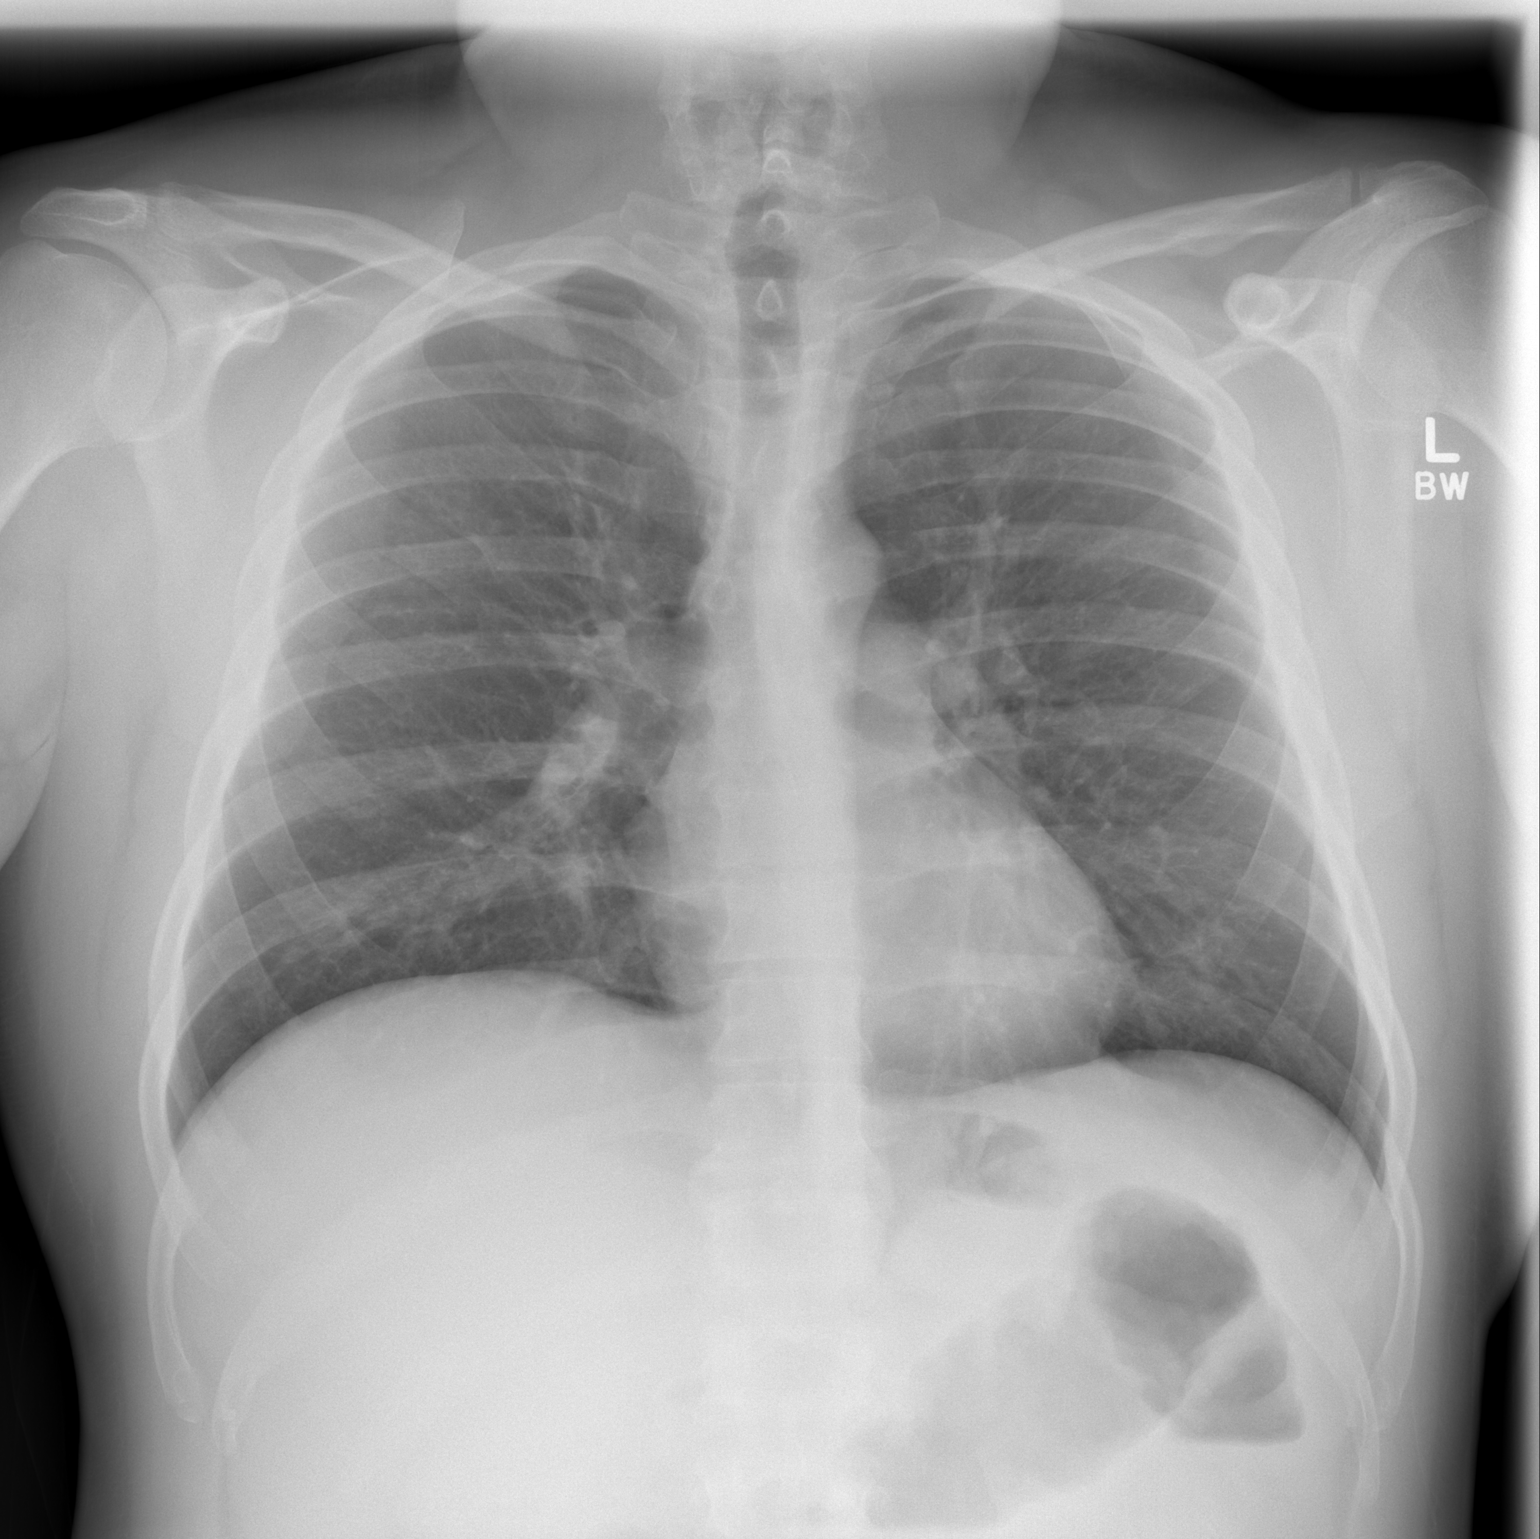

[w chest lat]
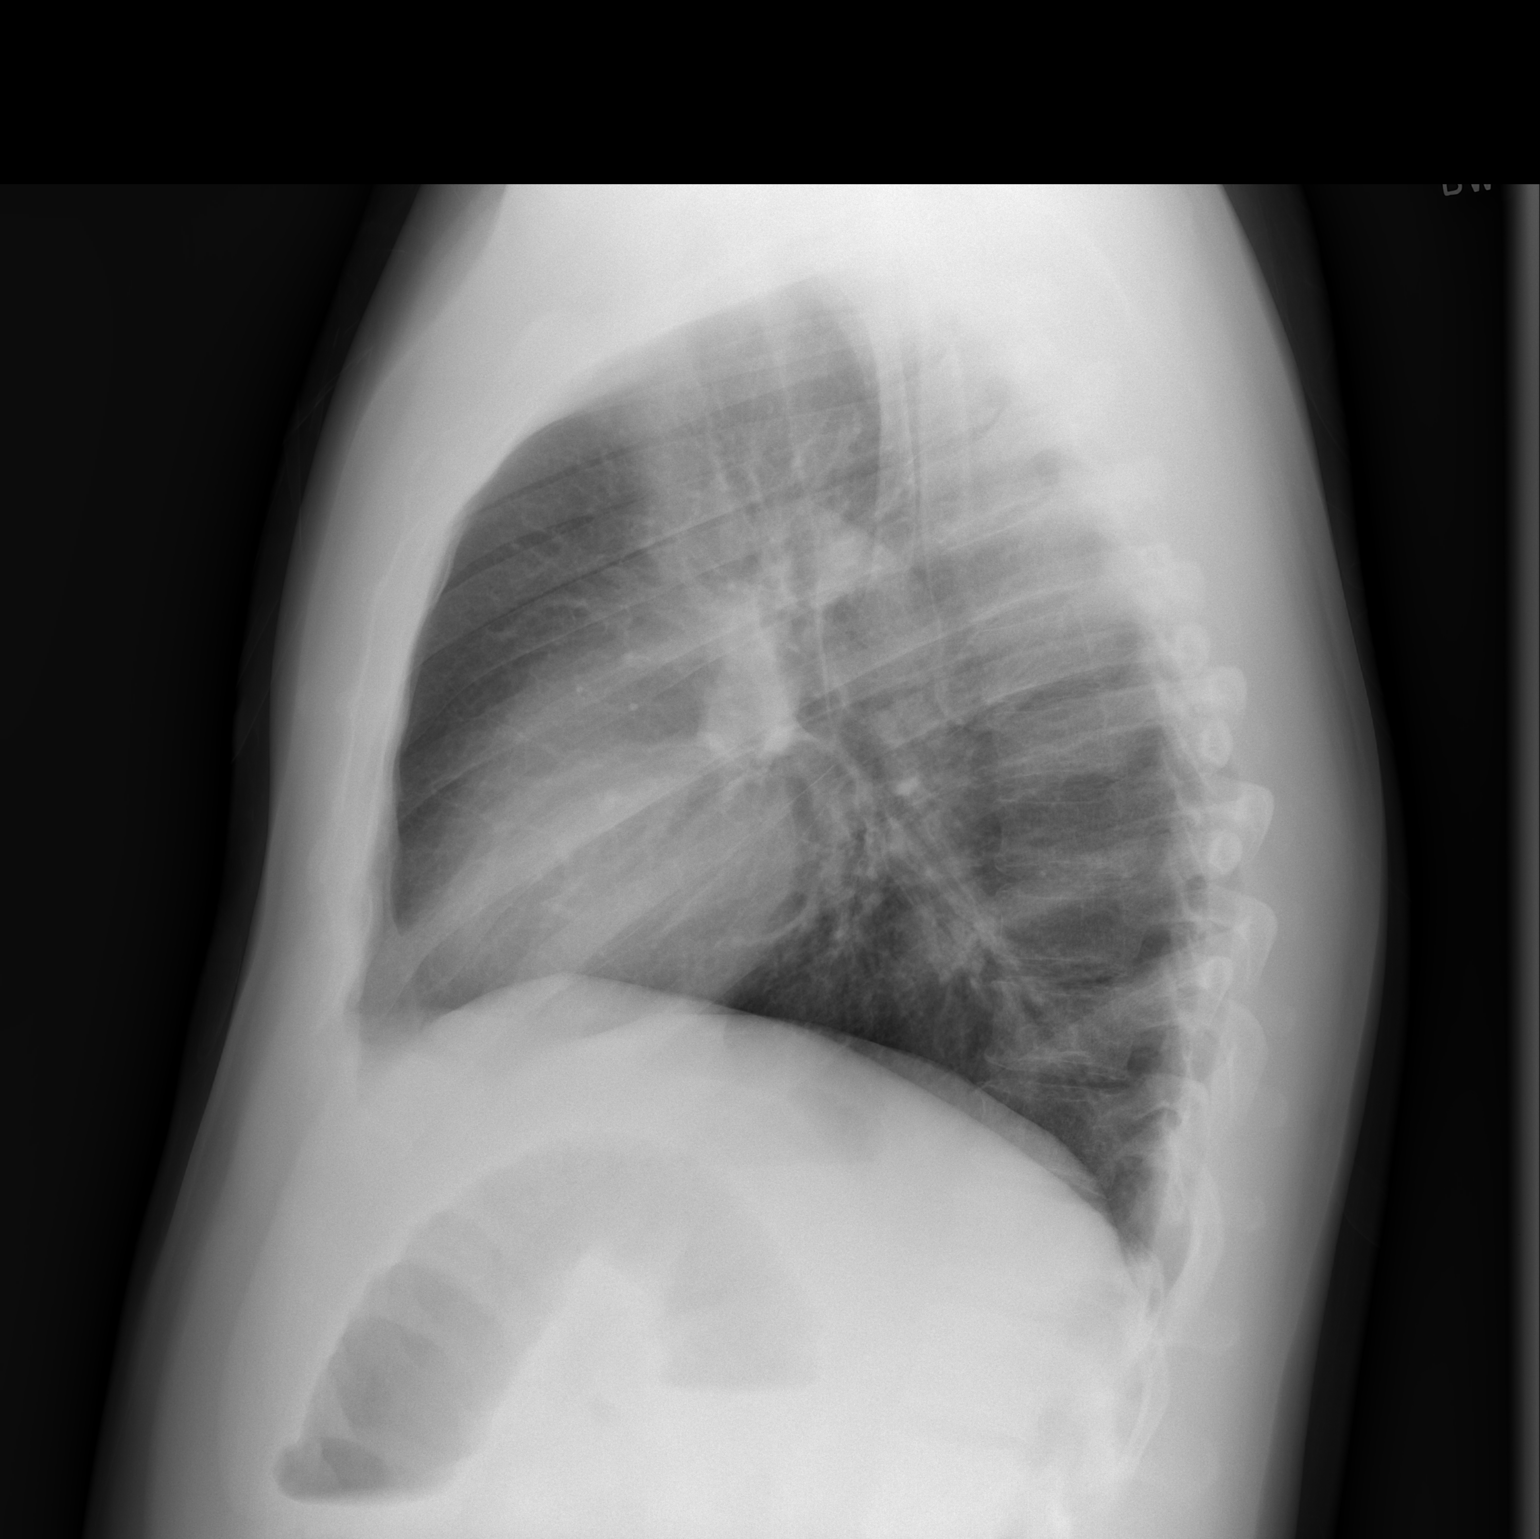

[2 of 2 positions shown; findings below may reference images not displayed]

FINDINGS: The heart size and mediastinal contours are within normal limits.
Both lungs are clear. The visualized skeletal structures are
unremarkable.
IMPRESSION: No active cardiopulmonary disease.

## 2017-05-09 ENCOUNTER — Ambulatory Visit (INDEPENDENT_AMBULATORY_CARE_PROVIDER_SITE_OTHER): Payer: Managed Care, Other (non HMO) | Admitting: Internal Medicine

## 2017-05-09 ENCOUNTER — Encounter: Payer: Self-pay | Admitting: Internal Medicine

## 2017-05-09 ENCOUNTER — Other Ambulatory Visit (INDEPENDENT_AMBULATORY_CARE_PROVIDER_SITE_OTHER): Payer: Managed Care, Other (non HMO)

## 2017-05-09 DIAGNOSIS — E559 Vitamin D deficiency, unspecified: Secondary | ICD-10-CM | POA: Diagnosis not present

## 2017-05-09 DIAGNOSIS — Z79899 Other long term (current) drug therapy: Secondary | ICD-10-CM | POA: Diagnosis not present

## 2017-05-09 DIAGNOSIS — K5100 Ulcerative (chronic) pancolitis without complications: Secondary | ICD-10-CM

## 2017-05-09 DIAGNOSIS — Z7960 Long term (current) use of unspecified immunomodulators and immunosuppressants: Secondary | ICD-10-CM

## 2017-05-09 LAB — VITAMIN D 25 HYDROXY (VIT D DEFICIENCY, FRACTURES): VITD: 33.43 ng/mL (ref 30.00–100.00)

## 2017-05-09 NOTE — Assessment & Plan Note (Signed)
Reminded about flu vaccine in Fall

## 2017-05-09 NOTE — Assessment & Plan Note (Signed)
Recheck level

## 2017-05-09 NOTE — Progress Notes (Signed)
   John [name] 36 y.o. 16-Oct-1981 [id number]  Assessment & Plan:   Ulcerative colitis ([place]) Doing well Continue current tx  Vitamin D deficiency Recheck level  Long-term use of immunosuppressant medication - Humira Reminded about flu vaccine in Fall   Return to clinic in about 6 months or so. Sooner if needed.  Subjective:   Chief ComplaintFollow-up ulcerative colitis  HPI John is here, doing well. Getting ready to move into temporary home while his house is being built. No complaints as far as his GI tract is concerned. Allergies  Allergen Reactions  . Amoxicillin Hives    Hives to trunk and arms  . Remicade [Infliximab] Anaphylaxis  . Ibuprofen     Lips swelling, rash, legs hurting  . Lialda [Mesalamine] Other (See Comments)    Worsening of rectal bleeding   Current Meds  Medication Sig  . Adalimumab (HUMIRA PEN) 40 MG/0.8ML PNKT Inject 40 mg into the skin every 14 (fourteen) days.  . Multiple Vitamins-Minerals (MULTIVITAMIN PO) Take by mouth. When he remembers.   Past Medical History:  Diagnosis Date  . Acute anal fissure 10/27/2015  . Esophageal reflux 06/08/2014  . GERD (gastroesophageal reflux disease)   . Hemorrhoids, internal, with bleeding 06/08/2014  . Perirectal abscess 07/31/2015  . 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 UC (ulcerative colitis) ([place])   . Vitamin D deficiency 04/03/2016   Past Surgical History:  Procedure Laterality Date  . ESOPHAGOGASTRODUODENOSCOPY N/A 07/11/2015   Procedure: ESOPHAGOGASTRODUODENOSCOPY (EGD);  Surgeon: Jerene Bears, MD;  Location: [facility];  Service: Gastroenterology;  Laterality: N/A;  . FLEXIBLE SIGMOIDOSCOPY N/A 07/11/2015   Procedure: FLEXIBLE SIGMOIDOSCOPY;  Surgeon: Jerene Bears, MD;  Location: [facility];  Service: Gastroenterology;  Laterality: N/A;  . INCISION AND DRAINAGE PERIRECTAL ABSCESS N/A 07/24/2015   Procedure: IRRIGATION AND DEBRIDEMENT PERIRECTAL  ABSCESS;  Surgeon: Alphonsa Overall, MD;  Location: [facility];  Service: General;  Laterality: N/A;  . KNEE ARTHROSCOPY Left     Social History Narrative   Married (Dottie Prevost CMA [facility]), 1 daughter born 2014 1 son born 2016   [employer]   Enjoys softball   family history includes Colon cancer in his paternal grandfather; Colon cancer (age of onset: 13) in his father; Diabetes in his father; Heart disease in his brother, father, maternal grandfather, maternal grandmother, mother, paternal grandfather, and paternal grandmother; Hyperlipidemia in his father and mother; Mental illness in his maternal grandfather.   Review of Systems As above he has some fluid in his ears he had otitis media issues earlier in the year he saw Dr. Constance Holster of ENT. His hearing is improved. If he does not continue to improve he might get tubes in his ears but he's holding on that for now.  Objective:   Physical Exam @BP  106/78   Pulse 72   Ht 5' 5"  (1.651 m)   Wt 216 lb 4 oz (98.1 kg)   BMI 35.99 kg/m @  General:  NAD Eyes:   anicteric Lungs:  clear Heart::  S1S2 no rubs, murmurs or gallops Abdomen:  soft and nontender, BS+ Ext:   no cyanosis or clubbing    Data Reviewed:   As above. Vitamin D level in January was low 8-12 weeks of treatment.

## 2017-05-09 NOTE — Assessment & Plan Note (Addendum)
Doing well Continue current tx

## 2017-05-09 NOTE — Patient Instructions (Signed)
  Your physician has requested that you go to the basement for the following lab work before leaving today: Vitamin D level   Follow up with Dr Carlean Purl in 6 months.    I appreciate the opportunity to care for you. Silvano Rusk, MD, [facility]

## 2017-05-11 NOTE — Progress Notes (Signed)
Normal Should take 1000 iu daily My Chart message

## 2017-09-02 ENCOUNTER — Encounter: Payer: Self-pay | Admitting: Internal Medicine

## 2017-09-12 NOTE — Telephone Encounter (Signed)
Done

## 2017-09-19 ENCOUNTER — Other Ambulatory Visit: Payer: Self-pay

## 2017-09-19 ENCOUNTER — Telehealth: Payer: Self-pay

## 2017-09-19 MED ORDER — ADALIMUMAB 40 MG/0.8ML ~~LOC~~ AJKT: 40.0000 mg | AUTO-INJECTOR | SUBCUTANEOUS | 5 refills | Status: DC

## 2017-09-19 NOTE — Telephone Encounter (Signed)
Faxed prior auth form for Humira to [facility], [phone]. Patient is on this as a long term treatment for UC.
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
## 2017-12-11 ENCOUNTER — Telehealth: Payer: Self-pay | Admitting: Family Medicine

## 2017-12-11 ENCOUNTER — Other Ambulatory Visit: Payer: Self-pay

## 2017-12-11 ENCOUNTER — Ambulatory Visit: Payer: Self-pay

## 2017-12-11 ENCOUNTER — Encounter: Payer: Self-pay | Admitting: Family Medicine

## 2017-12-11 ENCOUNTER — Ambulatory Visit: Payer: Managed Care, Other (non HMO) | Admitting: Family Medicine

## 2017-12-11 VITALS — BP 150/80 | HR 91 | Ht 65.0 in | Wt 227.0 lb

## 2017-12-11 DIAGNOSIS — G5603 Carpal tunnel syndrome, bilateral upper limbs: Secondary | ICD-10-CM | POA: Diagnosis not present

## 2017-12-11 DIAGNOSIS — M79641 Pain in right hand: Secondary | ICD-10-CM | POA: Diagnosis not present

## 2017-12-11 DIAGNOSIS — M79642 Pain in left hand: Secondary | ICD-10-CM | POA: Diagnosis not present

## 2017-12-11 MED ORDER — GABAPENTIN 100 MG PO CAPS: 200.0000 mg | ORAL_CAPSULE | Freq: Every day | ORAL | 3 refills | Status: DC

## 2017-12-11 MED ORDER — DICLOFENAC SODIUM 2 % TD SOLN: 2.0000 g | Freq: Two times a day (BID) | TRANSDERMAL | 3 refills | Status: DC

## 2017-12-11 NOTE — Patient Instructions (Signed)
Good to see you  Carpal tunnel syndrome.  Ice 20 minutes 2 times daily. Usually after activity and before bed. Wear carpal tunnel braces at night Exercises 3 times a week.  pennsaid pinkie amount topically 2 times daily as needed.   Gabapentin 200mg  at night can help as well  See me again in 4 week sto make sure you are doing much better

## 2017-12-11 NOTE — Assessment & Plan Note (Signed)
Bilateral injection given today, gabapentin for nighttime relief, discussed nighttime bracing, home exercises given, discussed icing regimen, topical anti-inflammatories prescribed.  Follow-up again in 4 weeks

## 2017-12-11 NOTE — Telephone Encounter (Signed)
Copied from [place]. Topic: Quick Communication - Rx Refill/Question >> Dec 11, 2017  4:02 PM Margot Ables wrote: Medication: diclofenac was sent to the wrong pharmacy - please resend Has the patient contacted their pharmacy? Yes.   Preferred Pharmacy (with phone number or street name): [id number] [phone](Phone) [phone](Fax)

## 2017-12-12 ENCOUNTER — Other Ambulatory Visit: Payer: Self-pay | Admitting: *Deleted

## 2017-12-12 MED ORDER — DICLOFENAC SODIUM 2 % TD SOLN: 2.0000 g | Freq: Two times a day (BID) | TRANSDERMAL | 3 refills | Status: DC

## 2017-12-12 NOTE — Progress Notes (Signed)
Med sent to wrong pharmacy. [facility] [phone] [facility] [address] [facility]

## 2017-12-12 NOTE — Telephone Encounter (Signed)
Called pt to let him know to [facility] [address] [facility], to pick up his prescription for diclofenac. No answer, message left.

## 2018-01-08 ENCOUNTER — Ambulatory Visit: Payer: Managed Care, Other (non HMO) | Admitting: Family Medicine

## 2018-01-27 ENCOUNTER — Ambulatory Visit: Payer: Managed Care, Other (non HMO) | Admitting: Internal Medicine

## 2018-03-05 ENCOUNTER — Other Ambulatory Visit: Payer: Self-pay | Admitting: Internal Medicine

## 2018-03-05 ENCOUNTER — Other Ambulatory Visit: Payer: Self-pay

## 2018-03-05 MED ORDER — ADALIMUMAB 40 MG/0.4ML ~~LOC~~ AJKT: 40.0000 mg | AUTO-INJECTOR | SUBCUTANEOUS | 6 refills | Status: DC

## 2018-03-05 NOTE — Telephone Encounter (Signed)
New rx to follow

## 2018-03-06 ENCOUNTER — Other Ambulatory Visit: Payer: Self-pay

## 2018-03-06 ENCOUNTER — Other Ambulatory Visit (INDEPENDENT_AMBULATORY_CARE_PROVIDER_SITE_OTHER): Payer: Managed Care, Other (non HMO)

## 2018-03-06 DIAGNOSIS — Z7960 Long term (current) use of unspecified immunomodulators and immunosuppressants: Secondary | ICD-10-CM

## 2018-03-06 DIAGNOSIS — D500 Iron deficiency anemia secondary to blood loss (chronic): Secondary | ICD-10-CM

## 2018-03-06 DIAGNOSIS — Z79899 Other long term (current) drug therapy: Secondary | ICD-10-CM

## 2018-03-06 DIAGNOSIS — K5100 Ulcerative (chronic) pancolitis without complications: Secondary | ICD-10-CM | POA: Diagnosis not present

## 2018-03-06 LAB — COMPREHENSIVE METABOLIC PANEL
ALT: 32 U/L (ref 0–53)
AST: 25 U/L (ref 0–37)
Albumin: 4.5 g/dL (ref 3.5–5.2)
Alkaline Phosphatase: 60 U/L (ref 39–117)
BUN: 14 mg/dL (ref 6–23)
CO2: 30 mEq/L (ref 19–32)
Calcium: 9.4 mg/dL (ref 8.4–10.5)
Chloride: 102 mEq/L (ref 96–112)
Creatinine, Ser: 1.2 mg/dL (ref 0.40–1.50)
GFR: 71.64 mL/min (ref >60.00)
Glucose, Bld: 98 mg/dL (ref 70–99)
Potassium: 3.9 mEq/L (ref 3.5–5.1)
Sodium: 139 mEq/L (ref 135–145)
Total Bilirubin: 0.5 mg/dL (ref 0.2–1.2)
Total Protein: 8.2 g/dL (ref 6.0–8.3)

## 2018-03-06 LAB — CBC WITH DIFFERENTIAL/PLATELET
Basophils Absolute: 0.1 10*3/uL (ref 0.0–0.1)
Basophils Relative: 1 % (ref 0.0–3.0)
Eosinophils Absolute: 0.3 10*3/uL (ref 0.0–0.7)
Eosinophils Relative: 3.2 % (ref 0.0–5.0)
HCT: 40.5 % (ref 39.0–52.0)
Hemoglobin: 14.1 g/dL (ref 13.0–17.0)
Lymphocytes Relative: 32 % (ref 12.0–46.0)
Lymphs Abs: 2.9 10*3/uL (ref 0.7–4.0)
MCHC: 34.8 g/dL (ref 30.0–36.0)
MCV: 87.7 fl (ref 78.0–100.0)
Monocytes Absolute: 0.7 10*3/uL (ref 0.1–1.0)
Monocytes Relative: 8 % (ref 3.0–12.0)
Neutro Abs: 5.1 10*3/uL (ref 1.4–7.7)
Neutrophils Relative %: 55.8 % (ref 43.0–77.0)
Platelets: 344 10*3/uL (ref 150.0–400.0)
RBC: 4.61 Mil/uL (ref 4.22–5.81)
RDW: 13.5 % (ref 11.5–15.5)
WBC: 9 10*3/uL (ref 4.0–10.5)

## 2018-03-06 NOTE — Progress Notes (Signed)
quant

## 2018-03-09 ENCOUNTER — Ambulatory Visit: Payer: Managed Care, Other (non HMO) | Admitting: Internal Medicine

## 2018-03-09 ENCOUNTER — Encounter: Payer: Self-pay | Admitting: Internal Medicine

## 2018-03-09 VITALS — BP 132/74 | HR 82 | Ht 65.0 in | Wt 223.0 lb

## 2018-03-09 DIAGNOSIS — Z7960 Long term (current) use of unspecified immunomodulators and immunosuppressants: Secondary | ICD-10-CM

## 2018-03-09 DIAGNOSIS — Z79899 Other long term (current) drug therapy: Secondary | ICD-10-CM | POA: Diagnosis not present

## 2018-03-09 DIAGNOSIS — K5100 Ulcerative (chronic) pancolitis without complications: Secondary | ICD-10-CM

## 2018-03-09 DIAGNOSIS — Z23 Encounter for immunization: Secondary | ICD-10-CM | POA: Diagnosis not present

## 2018-03-09 DIAGNOSIS — K219 Gastro-esophageal reflux disease without esophagitis: Secondary | ICD-10-CM | POA: Diagnosis not present

## 2018-03-09 LAB — QUANTIFERON-TB GOLD PLUS
Mitogen-NIL: >10 IU/mL
NIL: 0.03 IU/mL
QuantiFERON-TB Gold Plus: NEGATIVE
TB1-NIL: 0.04 IU/mL
TB2-NIL: 0.07 IU/mL

## 2018-03-09 MED ORDER — RANITIDINE HCL 150 MG PO TABS: 150.0000 mg | ORAL_TABLET | Freq: Every day | ORAL | Status: DC | PRN

## 2018-03-09 MED ORDER — PANTOPRAZOLE SODIUM 40 MG PO TBEC: 40.0000 mg | DELAYED_RELEASE_TABLET | Freq: Every day | ORAL | 0 refills | Status: DC

## 2018-03-09 NOTE — Assessment & Plan Note (Signed)
Seems to be in clinical remission, endoscopic evaluation with colonoscopy will be undertaken

## 2018-03-09 NOTE — Patient Instructions (Signed)
  Today you have been given the pneumococcal 23 vaccine.  Normal BMI (Body Mass Index- based on height and weight) is between 19 and 25. Your BMI today is Body mass index is 37.11 kg/m. Marland Kitchen Please consider follow up  regarding your BMI with your Primary Care Provider.   Try to cut back on your sweet tea intake.  We are giving you GERD information to read and follow.   You have been scheduled for a colonoscopy. Please follow written instructions given to you at your visit today.  Please pick up your prep supplies at the pharmacy within the next 1-3 days. If you use inhalers (even only as needed), please bring them with you on the day of your procedure. Your physician has requested that you go to www.startemmi.com and enter the access code given to you at your visit today. This web site gives a general overview about your procedure. However, you should still follow specific instructions given to you by our office regarding your preparation for the procedure.   I appreciate the opportunity to care for you. Silvano Rusk, MD, [facility]
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
## 2018-03-17 ENCOUNTER — Telehealth: Payer: Self-pay

## 2018-03-17 ENCOUNTER — Other Ambulatory Visit: Payer: Self-pay | Admitting: Internal Medicine

## 2018-03-17 NOTE — Telephone Encounter (Signed)
Pantoprazole is going to cost $80 per month. Is there another one you prefer for him Sir?

## 2018-03-17 NOTE — Telephone Encounter (Signed)
Wife informed

## 2018-03-17 NOTE — Telephone Encounter (Signed)
I dced it from his list  I bet cheapest way would be to do OTC omeprazole 20 or lansoprazole 40m every AM  He can do that and then also have him/Dottie see if they can tell from the formulary if can get a cheaper one

## 2018-04-05 ENCOUNTER — Encounter: Payer: Self-pay | Admitting: Internal Medicine

## 2018-04-05 DIAGNOSIS — R197 Diarrhea, unspecified: Secondary | ICD-10-CM

## 2018-04-05 NOTE — Telephone Encounter (Signed)
Spoke to John [name]  Having some bloody diarrhea of late  I have ordered a C diff PCR and want him to start Uceris samples if we have them  9 mg daily

## 2018-04-06 ENCOUNTER — Telehealth: Payer: Self-pay

## 2018-04-06 ENCOUNTER — Other Ambulatory Visit: Payer: Managed Care, Other (non HMO)

## 2018-04-06 DIAGNOSIS — R197 Diarrhea, unspecified: Secondary | ICD-10-CM

## 2018-04-06 MED ORDER — DIPHENOXYLATE-ATROPINE 2.5-0.025 MG PO TABS: 1.0000 | ORAL_TABLET | Freq: Four times a day (QID) | ORAL | 0 refills | Status: DC | PRN

## 2018-04-06 MED ORDER — DICYCLOMINE HCL 20 MG PO TABS: 20.0000 mg | ORAL_TABLET | Freq: Three times a day (TID) | ORAL | 1 refills | Status: DC

## 2018-04-06 NOTE — Telephone Encounter (Signed)
Patient requesting a refill of lomotil and dicyclomine.  Rx sent

## 2018-04-07 ENCOUNTER — Other Ambulatory Visit: Payer: Self-pay | Admitting: Internal Medicine

## 2018-04-07 DIAGNOSIS — Z79899 Other long term (current) drug therapy: Secondary | ICD-10-CM

## 2018-04-07 DIAGNOSIS — Z7960 Long term (current) use of unspecified immunomodulators and immunosuppressants: Secondary | ICD-10-CM

## 2018-04-07 DIAGNOSIS — K51011 Ulcerative (chronic) pancolitis with rectal bleeding: Secondary | ICD-10-CM

## 2018-04-07 LAB — CLOSTRIDIUM DIFFICILE TOXIN B, QUALITATIVE, REAL-TIME PCR: Toxigenic C. Difficile by PCR: NOT DETECTED

## 2018-04-07 MED ORDER — PREDNISONE 10 MG PO TABS: 40.0000 mg | ORAL_TABLET | Freq: Every day | ORAL | 1 refills | Status: DC

## 2018-04-07 NOTE — Progress Notes (Signed)
I spoke to John [name] C diff Will rx prednisone 10 mg tabs start at 40 mg qd and taper He has a colonoscopy 7/10 - will stick with that Needs adalimumab Abs and levels day before (or close to) next Humira shot  All ordered and My Chart message sent

## 2018-04-09 ENCOUNTER — Other Ambulatory Visit: Payer: Self-pay

## 2018-04-10 ENCOUNTER — Telehealth: Payer: Self-pay | Admitting: Internal Medicine

## 2018-04-10 ENCOUNTER — Encounter (HOSPITAL_COMMUNITY): Payer: Self-pay | Admitting: Student

## 2018-04-10 ENCOUNTER — Other Ambulatory Visit: Payer: Self-pay

## 2018-04-10 ENCOUNTER — Inpatient Hospital Stay (HOSPITAL_COMMUNITY)
Admission: EM | Admit: 2018-04-10 | Discharge: 2018-04-20 | DRG: 387 | Disposition: A | Discharge status: Home / Self Care | Payer: Managed Care, Other (non HMO) | Attending: Gastroenterology | Admitting: Gastroenterology

## 2018-04-10 DIAGNOSIS — Z79899 Other long term (current) drug therapy: Secondary | ICD-10-CM | POA: Diagnosis not present

## 2018-04-10 DIAGNOSIS — Z881 Allergy status to other antibiotic agents status: Secondary | ICD-10-CM | POA: Diagnosis not present

## 2018-04-10 DIAGNOSIS — R1032 Left lower quadrant pain: Secondary | ICD-10-CM | POA: Diagnosis not present

## 2018-04-10 DIAGNOSIS — E559 Vitamin D deficiency, unspecified: Secondary | ICD-10-CM | POA: Diagnosis present

## 2018-04-10 DIAGNOSIS — K219 Gastro-esophageal reflux disease without esophagitis: Secondary | ICD-10-CM | POA: Diagnosis present

## 2018-04-10 DIAGNOSIS — Z800 Family history of malignant neoplasm of digestive organs: Secondary | ICD-10-CM | POA: Diagnosis not present

## 2018-04-10 DIAGNOSIS — K51911 Ulcerative colitis, unspecified with rectal bleeding: Secondary | ICD-10-CM

## 2018-04-10 DIAGNOSIS — K51011 Ulcerative (chronic) pancolitis with rectal bleeding: Secondary | ICD-10-CM | POA: Diagnosis not present

## 2018-04-10 DIAGNOSIS — R739 Hyperglycemia, unspecified: Secondary | ICD-10-CM | POA: Diagnosis present

## 2018-04-10 DIAGNOSIS — Z8711 Personal history of peptic ulcer disease: Secondary | ICD-10-CM | POA: Diagnosis not present

## 2018-04-10 DIAGNOSIS — K5100 Ulcerative (chronic) pancolitis without complications: Principal | ICD-10-CM | POA: Diagnosis present

## 2018-04-10 DIAGNOSIS — E039 Hypothyroidism, unspecified: Secondary | ICD-10-CM | POA: Diagnosis present

## 2018-04-10 DIAGNOSIS — E669 Obesity, unspecified: Secondary | ICD-10-CM | POA: Diagnosis present

## 2018-04-10 DIAGNOSIS — Z8249 Family history of ischemic heart disease and other diseases of the circulatory system: Secondary | ICD-10-CM

## 2018-04-10 DIAGNOSIS — Z888 Allergy status to other drugs, medicaments and biological substances status: Secondary | ICD-10-CM | POA: Diagnosis not present

## 2018-04-10 DIAGNOSIS — R7989 Other specified abnormal findings of blood chemistry: Secondary | ICD-10-CM | POA: Diagnosis not present

## 2018-04-10 DIAGNOSIS — M1990 Unspecified osteoarthritis, unspecified site: Secondary | ICD-10-CM | POA: Diagnosis present

## 2018-04-10 DIAGNOSIS — K625 Hemorrhage of anus and rectum: Secondary | ICD-10-CM | POA: Diagnosis not present

## 2018-04-10 DIAGNOSIS — K51811 Other ulcerative colitis with rectal bleeding: Secondary | ICD-10-CM | POA: Diagnosis not present

## 2018-04-10 DIAGNOSIS — Z6836 Body mass index (BMI) 36.0-36.9, adult: Secondary | ICD-10-CM

## 2018-04-10 DIAGNOSIS — R197 Diarrhea, unspecified: Secondary | ICD-10-CM | POA: Diagnosis not present

## 2018-04-10 DIAGNOSIS — Z833 Family history of diabetes mellitus: Secondary | ICD-10-CM

## 2018-04-10 DIAGNOSIS — Z8349 Family history of other endocrine, nutritional and metabolic diseases: Secondary | ICD-10-CM | POA: Diagnosis not present

## 2018-04-10 DIAGNOSIS — R1110 Vomiting, unspecified: Secondary | ICD-10-CM | POA: Diagnosis not present

## 2018-04-10 DIAGNOSIS — K51511 Left sided colitis with rectal bleeding: Secondary | ICD-10-CM

## 2018-04-10 DIAGNOSIS — Z7952 Long term (current) use of systemic steroids: Secondary | ICD-10-CM | POA: Diagnosis not present

## 2018-04-10 DIAGNOSIS — D72829 Elevated white blood cell count, unspecified: Secondary | ICD-10-CM | POA: Diagnosis not present

## 2018-04-10 DIAGNOSIS — K5190 Ulcerative colitis, unspecified, without complications: Secondary | ICD-10-CM | POA: Diagnosis present

## 2018-04-10 DIAGNOSIS — R112 Nausea with vomiting, unspecified: Secondary | ICD-10-CM

## 2018-04-10 LAB — CBC WITH DIFFERENTIAL/PLATELET
Basophils Absolute: 0 10*3/uL (ref 0.0–0.1)
Basophils Relative: 0 %
Eosinophils Absolute: 0.1 10*3/uL (ref 0.0–0.7)
Eosinophils Relative: 1 %
HCT: 40.2 % (ref 39.0–52.0)
Hemoglobin: 13.5 g/dL (ref 13.0–17.0)
Lymphocytes Relative: 11 %
Lymphs Abs: 1.3 10*3/uL (ref 0.7–4.0)
MCH: 30.3 pg (ref 26.0–34.0)
MCHC: 33.6 g/dL (ref 30.0–36.0)
MCV: 90.1 fL (ref 78.0–100.0)
Monocytes Absolute: 1.3 10*3/uL — ABNORMAL HIGH (ref 0.1–1.0)
Monocytes Relative: 11 %
Neutro Abs: 9 10*3/uL — ABNORMAL HIGH (ref 1.7–7.7)
Neutrophils Relative %: 77 %
Platelets: 307 10*3/uL (ref 150–400)
RBC: 4.46 MIL/uL (ref 4.22–5.81)
RDW: 13.5 % (ref 11.5–15.5)
WBC: 11.7 10*3/uL — ABNORMAL HIGH (ref 4.0–10.5)

## 2018-04-10 LAB — COMPREHENSIVE METABOLIC PANEL
ALT: 32 U/L (ref 17–63)
AST: 25 U/L (ref 15–41)
Albumin: 4.1 g/dL (ref 3.5–5.0)
Alkaline Phosphatase: 73 U/L (ref 38–126)
Anion gap: 9 (ref 5–15)
BUN: 14 mg/dL (ref 6–20)
CO2: 26 mmol/L (ref 22–32)
Calcium: 9.2 mg/dL (ref 8.9–10.3)
Chloride: 104 mmol/L (ref 101–111)
Creatinine, Ser: 1.02 mg/dL (ref 0.61–1.24)
GFR calc Af Amer: >60 mL/min (ref >60)
GFR calc non Af Amer: >60 mL/min (ref >60)
Glucose, Bld: 134 mg/dL — ABNORMAL HIGH (ref 65–99)
Potassium: 4 mmol/L (ref 3.5–5.1)
Sodium: 139 mmol/L (ref 135–145)
Total Bilirubin: 1.2 mg/dL (ref 0.3–1.2)
Total Protein: 8.3 g/dL — ABNORMAL HIGH (ref 6.5–8.1)

## 2018-04-10 LAB — POC OCCULT BLOOD, ED: Fecal Occult Bld: POSITIVE — AB

## 2018-04-10 LAB — URINALYSIS, ROUTINE W REFLEX MICROSCOPIC
Bilirubin Urine: NEGATIVE
Glucose, UA: NEGATIVE mg/dL
Hgb urine dipstick: NEGATIVE
Ketones, ur: NEGATIVE mg/dL
Leukocytes, UA: NEGATIVE
Nitrite: NEGATIVE
Protein, ur: NEGATIVE mg/dL
Specific Gravity, Urine: 1.01 (ref 1.005–1.030)
pH: 7 (ref 5.0–8.0)

## 2018-04-10 LAB — LIPASE, BLOOD: Lipase: 21 U/L (ref 11–51)

## 2018-04-10 LAB — C DIFFICILE QUICK SCREEN W PCR REFLEX
C Diff antigen: NEGATIVE
C Diff interpretation: NOT DETECTED
C Diff toxin: NEGATIVE

## 2018-04-10 MED ORDER — SODIUM CHLORIDE 0.9 % IV SOLN: INTRAVENOUS | Status: DC

## 2018-04-10 MED ORDER — ONDANSETRON HCL 4 MG/2ML IJ SOLN
4.0000 mg | Freq: Four times a day (QID) | INTRAMUSCULAR | Status: DC | PRN
  Administered 2018-04-11 – 2018-04-14 (×7): 4 mg via INTRAVENOUS
  Filled 2018-04-10 (×7): qty 2

## 2018-04-10 MED ORDER — PEG-KCL-NACL-NASULF-NA ASC-C 100 G PO SOLR
0.5000 | Freq: Once | ORAL | Status: AC
  Administered 2018-04-11: 100 g via ORAL

## 2018-04-10 MED ORDER — ONDANSETRON HCL 4 MG PO TABS: 4.0000 mg | ORAL_TABLET | Freq: Four times a day (QID) | ORAL | Status: DC | PRN

## 2018-04-10 MED ORDER — METHYLPREDNISOLONE SODIUM SUCC 40 MG IJ SOLR
40.0000 mg | Freq: Every morning | INTRAMUSCULAR | Status: DC
  Administered 2018-04-10: 40 mg via INTRAVENOUS
  Filled 2018-04-10: qty 1

## 2018-04-10 MED ORDER — ONDANSETRON HCL 4 MG/2ML IJ SOLN: 4.0000 mg | Freq: Four times a day (QID) | INTRAMUSCULAR | Status: DC | PRN

## 2018-04-10 MED ORDER — PEG-KCL-NACL-NASULF-NA ASC-C 100 G PO SOLR
0.5000 | Freq: Once | ORAL | Status: AC
  Administered 2018-04-10: 100 g via ORAL
  Filled 2018-04-10: qty 1

## 2018-04-10 MED ORDER — ACETAMINOPHEN 650 MG RE SUPP: 650.0000 mg | Freq: Four times a day (QID) | RECTAL | Status: DC | PRN

## 2018-04-10 MED ORDER — SODIUM CHLORIDE 0.9 % IV SOLN
INTRAVENOUS | Status: DC
  Administered 2018-04-10 – 2018-04-19 (×8): via INTRAVENOUS

## 2018-04-10 MED ORDER — FAMOTIDINE 20 MG PO TABS
20.0000 mg | ORAL_TABLET | Freq: Two times a day (BID) | ORAL | Status: DC | PRN
  Administered 2018-04-10 – 2018-04-12 (×3): 20 mg via ORAL
  Filled 2018-04-10 (×3): qty 1

## 2018-04-10 MED ORDER — PEG-KCL-NACL-NASULF-NA ASC-C 100 G PO SOLR: 1.0000 | Freq: Once | ORAL | Status: DC

## 2018-04-10 MED ORDER — HYDROCODONE-ACETAMINOPHEN 5-325 MG PO TABS
1.0000 | ORAL_TABLET | ORAL | Status: DC | PRN
  Administered 2018-04-14 (×3): 1 via ORAL
  Filled 2018-04-10: qty 2
  Filled 2018-04-10 (×3): qty 1

## 2018-04-10 MED ORDER — ACETAMINOPHEN 325 MG PO TABS: 650.0000 mg | ORAL_TABLET | Freq: Four times a day (QID) | ORAL | Status: DC | PRN

## 2018-04-10 MED ORDER — DICYCLOMINE HCL 10 MG PO CAPS: 20.0000 mg | ORAL_CAPSULE | Freq: Two times a day (BID) | ORAL | Status: DC | PRN

## 2018-04-10 NOTE — H&P (Signed)
History and Physical    John [name] GYI:[id number] DOB: 15-Jun-1981 DOA: 04/10/2018  PCP: Wendie Agreste, MD  Patient coming from: Home  Chief Complaint: Bloody diarrhea  HPI: John [name] is a 37 y.o. male with medical history significant of ulcerative colitis diagnosed in 2016 currently on Humira who presents with bloody diarrhea over the past week.  Also admits to left lower quadrant cramping with diarrhea.  He had a fever this morning at home as well as chills.  He admits to nausea without vomiting.  No other chest pain, cough, shortness of breath.  He was started on prednisone as an outpatient by Dr. Carlean Purl, however patient has been having continued bloody diarrhea, fever, nausea despite outpatient treatment.  He was referred to be evaluated in the emergency department.  ED Course: Labs obtained which revealed WBC 11.7, hemoglobin 13.5, creatinine 1.02.  Fecal occult blood was positive.  Stool PCR sent.  Review of Systems: As per HPI otherwise 10 point review of systems negative.   Past Medical History:  Diagnosis Date  . Acute anal fissure 10/27/2015  . Esophageal reflux 06/08/2014  . GERD (gastroesophageal reflux disease)   . Hemorrhoids, internal, with bleeding 06/08/2014  . Perirectal abscess 07/31/2015  . 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 UC (ulcerative colitis) ([place])   . Vitamin D deficiency 04/03/2016    Past Surgical History:  Procedure Laterality Date  . ESOPHAGOGASTRODUODENOSCOPY N/A 07/11/2015   Procedure: ESOPHAGOGASTRODUODENOSCOPY (EGD);  Surgeon: Jerene Bears, MD;  Location: [facility];  Service: Gastroenterology;  Laterality: N/A;  . FLEXIBLE SIGMOIDOSCOPY N/A 07/11/2015   Procedure: FLEXIBLE SIGMOIDOSCOPY;  Surgeon: Jerene Bears, MD;  Location: [facility];  Service: Gastroenterology;  Laterality: N/A;  . INCISION AND DRAINAGE PERIRECTAL ABSCESS N/A 07/24/2015   Procedure: IRRIGATION AND DEBRIDEMENT PERIRECTAL  ABSCESS;  Surgeon: Alphonsa Overall, MD;  Location: [facility];  Service: General;  Laterality: N/A;  . KNEE ARTHROSCOPY Left   . TYMPANOSTOMY TUBE PLACEMENT Bilateral      reports that he has never smoked. He has never used smokeless tobacco. He reports that he does not drink alcohol or use drugs.  Allergies  Allergen Reactions  . Amoxicillin Hives    Hives to trunk and arms  . Remicade [Infliximab] Anaphylaxis  . Lialda [Mesalamine] Other (See Comments)    Worsening of rectal bleeding  . Ibuprofen     Lips swelling, rash, legs hurting (has taken this occasionally with no problem as of 03/09/18 so may be miscategorized as allergy)    Family History  Problem Relation Age of Onset  . Diabetes Father   . Colon cancer Father 34  . Heart disease Father   . Hyperlipidemia Father   . Heart disease Mother   . Hyperlipidemia Mother   . Heart disease Brother   . Heart disease Maternal Grandmother   . Heart disease Maternal Grandfather   . Mental illness Maternal Grandfather   . Heart disease Paternal Grandmother   . Heart disease Paternal Grandfather   . Colon cancer Paternal Grandfather     Prior to Admission medications   Medication Sig Start Date End Date Taking? Authorizing Provider  Adalimumab (HUMIRA PEN) 40 MG/0.4ML PNKT Inject 40 mg into the skin every 14 (fourteen) days. After the completion of the starter kit Patient taking differently: Inject 40 mg into the skin every 14 (fourteen) days.  03/05/18   Gatha Mayer, MD  Cholecalciferol (VITAMIN D) 2000 units CAPS Take 1 capsule by mouth daily.    [provider]  dicyclomine (BENTYL) 20 MG tablet Take 1 tablet (20 mg total) by mouth 4 (four) times daily -  before meals and at bedtime. 04/06/18   Gatha Mayer, MD  diphenoxylate-atropine (LOMOTIL) 2.5-0.025 MG tablet Take 1 tablet by mouth 4 (four) times daily as needed for diarrhea or loose stools. 04/06/18   Gatha Mayer, MD  loratadine (CLARITIN) 10 MG tablet Take 10  mg by mouth daily.    [provider]  Multiple Vitamins-Minerals (MULTIVITAMIN PO) Take 1 tablet by mouth daily.     [provider]  predniSONE (DELTASONE) 10 MG tablet Take 4 tablets (40 mg total) by mouth daily with breakfast. Then after 3 days reduce to 30 mg, further taper will follow from Dr. Carlean Purl 04/07/18   Gatha Mayer, MD  ranitidine (ZANTAC) 150 MG tablet Take 1 tablet (150 mg total) by mouth daily as needed for heartburn. 03/09/18   Gatha Mayer, MD    Physical Exam: Vitals:   04/10/18 0928 04/10/18 0929  BP:  (!) 147/83  Pulse:  85  Resp:  18  Temp:  98.7 F (37.1 C)  TempSrc:  Oral  SpO2:  96%  Weight: 99.8 kg (220 lb)   Height: _0  (1.651 m)     Constitutional: NAD, calm, comfortable Eyes: PERRL, lids and conjunctivae normal ENMT: Mucous membranes are moist. Posterior pharynx clear of any exudate or lesions.Normal dentition.  Neck: normal, supple, no masses, no thyromegaly Respiratory: clear to auscultation bilaterally, no wheezing, no crackles. Normal respiratory effort. No accessory muscle use.  Cardiovascular: Regular rate and rhythm, no murmurs / rubs / gallops. No extremity edema.   Abdomen: +TTP LLQ, +BS, soft  Musculoskeletal: no clubbing / cyanosis. No joint deformity upper and lower extremities. Good ROM, no contractures. Normal muscle tone.  Skin: no rashes, lesions, ulcers. No induration Neurologic: CN 2-12 grossly intact.  Strength 5/5 in all 4.  Psychiatric: Normal judgment and insight. Alert and oriented x 3. Normal mood.   Labs on Admission: I have personally reviewed following labs and imaging studies  CBC: Recent Labs  Lab 04/10/18 1000  WBC 11.7*  NEUTROABS 9.0*  HGB 13.5  HCT 40.2  MCV 90.1  PLT 998   Basic Metabolic Panel: Recent Labs  Lab 04/10/18 1000  NA 139  K 4.0  CL 104  CO2 26  GLUCOSE 134*  BUN 14  CREATININE 1.02  CALCIUM 9.2   GFR: Estimated Creatinine Clearance: 105.6 mL/min (by C-G  formula based on SCr of 1.02 mg/dL). Liver Function Tests: Recent Labs  Lab 04/10/18 1000  AST 25  ALT 32  ALKPHOS 73  BILITOT 1.2  PROT 8.3*  ALBUMIN 4.1   Recent Labs  Lab 04/10/18 1000  LIPASE 21   No results for input(s): AMMONIA in the last 168 hours. Coagulation Profile: No results for input(s): INR, PROTIME in the last 168 hours. Cardiac Enzymes: No results for input(s): CKTOTAL, CKMB, CKMBINDEX, TROPONINI in the last 168 hours. BNP (last 3 results) No results for input(s): PROBNP in the last 8760 hours. HbA1C: No results for input(s): HGBA1C in the last 72 hours. CBG: No results for input(s): GLUCAP in the last 168 hours. Lipid Profile: No results for input(s): CHOL, HDL, LDLCALC, TRIG, CHOLHDL, LDLDIRECT in the last 72 hours. Thyroid Function Tests: No results for input(s): TSH, T4TOTAL, FREET4, T3FREE, THYROIDAB in the last 72  hours. Anemia Panel: No results for input(s): VITAMINB12, FOLATE, FERRITIN, TIBC, IRON, RETICCTPCT in the last 72 hours. Urine analysis:    Component Value Date/Time   COLORURINE YELLOW 08/02/2015 1001   APPEARANCEUR CLEAR 08/02/2015 1001   LABSPEC >=1.030 (A) 08/02/2015 1001   PHURINE 6.0 08/02/2015 1001   GLUCOSEU NEGATIVE 08/02/2015 1001   HGBUR NEGATIVE 08/02/2015 1001   BILIRUBINUR NEGATIVE 08/02/2015 1001   KETONESUR TRACE (A) 08/02/2015 1001   PROTEINUR 100 (A) 07/18/2015 2255   UROBILINOGEN 0.2 08/02/2015 1001   NITRITE NEGATIVE 08/02/2015 1001   LEUKOCYTESUR NEGATIVE 08/02/2015 1001   Sepsis Labs: !!!!!!!!!!!!!!!!!!!!!!!!!!!!!!!!!!!!!!!!!!!! _0 (procalcitonin:4,lacticidven:4) )No results found for this or any previous visit (from the past 240 hour(s)).   Radiological Exams on Admission: No results found.   Assessment/Plan Principal Problem:   Bloody diarrhea Active Problems:   Ulcerative colitis (HCC)   Bloody diarrhea, UC flare vs infectious  -Currently taking Humira as outpatient, follows with Dr.  Carlean Purl  -GI PCR pending -GI consulted  -IV solumedrol  -IVF -NPO pending endoscopy timing    DVT prophylaxis: SCD Code Status: Full  Family Communication: Wife at bedside Disposition Plan: Pending GI evaluation and consult  Consults called: GI   Admission status: Inpatient   * I certify that at the point of admission it is my clinical judgment that the patient will require inpatient hospital care spanning beyond 2 midnights from the point of admission due to high intensity of service, high risk for further deterioration and high frequency of surveillance required.*   Dessa Phi, DO [facility] [email or web address] Password [facility] [address] 04/10/2018, 11:25 AM

## 2018-04-10 NOTE — H&P (View-Only) (Signed)
Referring Provider: [facility]    Primary Care Physician:  Wendie Agreste, MD Primary Gastroenterologist:   Silvano Rusk,  MD  Reason for Consultation:  Bloody diarrhea / abdominal pain / UC   ASSESSMENT AND PLAN:     37 yo male with 3 year hx of left sided UC presenting with flare type symptoms. He was doing well on Humira until a week ago. Now with bloody diarrhea and LLQ pain.  -outpatient C-diff negative. Repeat study already ordered.  -Solumedrol 40 mg Q am -He needs flex sig vrs colonoscopy this admission (he is scheduled for surveillance colonoscopy in July) -IV fluids -sips clears -anti-emetics.  -SCDs - at increased risk for DVTs.   2. GERD. PPI stopped days ago because of diarrhea. Currently asymptomatic after dietary changes.  -Pepcid 20 mg BID as needed    Attending physician's note   I have taken a history, examined the patient and reviewed the chart. I agree with the Advanced Practitioner's note, impression and recommendations. Acute bloody diarrhea with fevers. R/O UC flare vs infection. GI pathogen panel. C diff quick scan, recent C diff PCR was negative. Clear liquids, IVF, IV methylpredosonle 40 mg qd for now. Colonoscopy tomorrow.   Lucio Edward, MD [facility] [phone] office    HPI: John [name] is a 37 y.o. male, husband of Dottie one of the CMAs in our office. He has  A [facility] of colon cancer and had a normal screening colonoscopy January 2016. In Sept 2016 he was diagnosed with C-diff after taking Clindamycin. Diarrhea didn't improve despite Vancomycin. He required hospitalization for persistent diarrhea and a peri-rectal abscess. Inpatient flex sigmoidoscopy in Sept revealed moderate to severe left sided UC. He had a reaction to Remicade, changed immediately to Humira which he takes Q 2 weeks, next dose due on Tuesday. Patient was doing well until about a week ago.  Now with 10-12 diarrheal stools a day.  He has LLQ cramping, mainly associated with  defecation. He has been in contact with our office. C-diff negative. . Stopped Omeprazole 6 days ago thinking it was cause of diarrhea but that didn't make any difference. He started Lomtoil  QID last week without much improvement .We started him on prednisone 3 days ago, still no improvement.  Fever 101.6 at home this am. No arthralgias, skin rashes, eye pain. No swelling or pain in legs. No urinary sx. Weight is stable.  ED evaluation Mildly hypertensive, VS o/w normal. Afebrile WBC 11.7 hgb 13.5 HCT 40 Liver chemistries normal Normal renal funtion   Past Medical History:  Diagnosis Date  . Acute anal fissure 10/27/2015  . Esophageal reflux 06/08/2014  . GERD (gastroesophageal reflux disease)   . Hemorrhoids, internal, with bleeding 06/08/2014  . Perirectal abscess 07/31/2015  . 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 UC (ulcerative colitis) ([place])   . Vitamin D deficiency 04/03/2016    Past Surgical History:  Procedure Laterality Date  . ESOPHAGOGASTRODUODENOSCOPY N/A 07/11/2015   Procedure: ESOPHAGOGASTRODUODENOSCOPY (EGD);  Surgeon: Jerene Bears, MD;  Location: [facility];  Service: Gastroenterology;  Laterality: N/A;  . FLEXIBLE SIGMOIDOSCOPY N/A 07/11/2015   Procedure: FLEXIBLE SIGMOIDOSCOPY;  Surgeon: Jerene Bears, MD;  Location: [facility];  Service: Gastroenterology;  Laterality: N/A;  . INCISION AND DRAINAGE PERIRECTAL ABSCESS N/A 07/24/2015   Procedure: IRRIGATION AND DEBRIDEMENT PERIRECTAL ABSCESS;  Surgeon: Alphonsa Overall, MD;  Location: [facility];  Service: General;  Laterality: N/A;  . KNEE ARTHROSCOPY Left   . TYMPANOSTOMY TUBE PLACEMENT Bilateral     Prior to Admission medications   Medication Sig Start Date End Date Taking? Authorizing Provider  Adalimumab (HUMIRA PEN) 40 MG/0.4ML PNKT Inject 40 mg into the skin every 14 (fourteen) days. After the completion of the starter kit Patient taking differently: Inject 40 mg into  the skin every 14 (fourteen) days.  03/05/18   Gatha Mayer, MD  Cholecalciferol (VITAMIN D) 2000 units CAPS Take 1 capsule by mouth daily.    [provider]  dicyclomine (BENTYL) 20 MG tablet Take 1 tablet (20 mg total) by mouth 4 (four) times daily -  before meals and at bedtime. 04/06/18   Gatha Mayer, MD  diphenoxylate-atropine (LOMOTIL) 2.5-0.025 MG tablet Take 1 tablet by mouth 4 (four) times daily as needed for diarrhea or loose stools. 04/06/18   Gatha Mayer, MD  loratadine (CLARITIN) 10 MG tablet Take 10 mg by mouth daily.    [provider]  Multiple Vitamins-Minerals (MULTIVITAMIN PO) Take 1 tablet by mouth daily.     [provider]  predniSONE (DELTASONE) 10 MG tablet Take 4 tablets (40 mg total) by mouth daily with breakfast. Then after 3 days reduce to 30 mg, further taper will follow from Dr. Carlean Purl 04/07/18   Gatha Mayer, MD  ranitidine (ZANTAC) 150 MG tablet Take 1 tablet (150 mg total) by mouth daily as needed for heartburn. 03/09/18   Gatha Mayer, MD    No current facility-administered medications for this encounter.    Current Outpatient Medications  Medication Sig Dispense Refill  . Adalimumab (HUMIRA PEN) 40 MG/0.4ML PNKT Inject 40 mg into the skin every 14 (fourteen) days. After the completion of the starter kit (Patient taking differently: Inject 40 mg into the skin every 14 (fourteen) days. ) 2 each 6  . Cholecalciferol (VITAMIN D) 2000 units CAPS Take 1 capsule by mouth daily.    Marland Kitchen dicyclomine (BENTYL) 20 MG tablet Take 1 tablet (20 mg total) by mouth 4 (four) times daily -  before meals and at bedtime. 120 tablet 1  . diphenoxylate-atropine (LOMOTIL) 2.5-0.025 MG tablet Take 1 tablet by mouth 4 (four) times daily as needed for diarrhea or loose stools. 30 tablet 0  . loratadine (CLARITIN) 10 MG tablet Take 10 mg by mouth daily.    . Multiple Vitamins-Minerals (MULTIVITAMIN PO) Take 1 tablet by mouth daily.     . predniSONE  (DELTASONE) 10 MG tablet Take 4 tablets (40 mg total) by mouth daily with breakfast. Then after 3 days reduce to 30 mg, further taper will follow from Dr. Carlean Purl 100 tablet 1  . ranitidine (ZANTAC) 150 MG tablet Take 1 tablet (150 mg total) by mouth daily as needed for heartburn.      Allergies as of 04/10/2018 - Review Complete 04/10/2018  Allergen Reaction Noted  . Amoxicillin Hives 02/06/2016  . Remicade [infliximab] Anaphylaxis 08/03/2015  . Lialda [mesalamine] Other (See Comments) 02/06/2016  . Ibuprofen  06/29/2015    Family History  Problem Relation Age of Onset  . Diabetes Father   . Colon cancer Father 37  . Heart disease Father   . Hyperlipidemia Father   . Heart disease Mother   . Hyperlipidemia Mother   . Heart disease Brother   . Heart disease Maternal Grandmother   . Heart disease Maternal Grandfather   . Mental illness Maternal Grandfather   . Heart disease Paternal Grandmother   .  Heart disease Paternal Grandfather   . Colon cancer Paternal Grandfather     Social History   Socioeconomic History  . Marital status: Married    Spouse name: Not on file  . Number of children: 2  . Years of education: Not on file  . Highest education level: Not on file  Occupational History  . Occupation: Designer, industrial/product: [employer]  . Financial resource strain: Not on file  . Food insecurity:    Worry: Not on file    Inability: Not on file  . Transportation needs:    Medical: Not on file    Non-medical: Not on file  Tobacco Use  . Smoking status: Never Smoker  . Smokeless tobacco: Never Used  Substance and Sexual Activity  . Alcohol use: No    Alcohol/week: 0.0 oz  . Drug use: No  . Sexual activity: Yes  Lifestyle  . Physical activity:    Days per week: Not on file    Minutes per session: Not on file  . Stress: Not on file  Relationships  . Social connections:    Talks on phone: Not on file    Gets together: Not on file     Attends religious service: Not on file    Active member of club or organization: Not on file    Attends meetings of clubs or organizations: Not on file    Relationship status: Not on file  . Intimate partner violence:    Fear of current or ex partner: Not on file    Emotionally abused: Not on file    Physically abused: Not on file    Forced sexual activity: Not on file  Other Topics Concern  . 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  Physical Exam: Vital signs in last 24 hours: Temp:  [98.7 F (37.1 C)] 98.7 F (37.1 C) (06/21 0929) Pulse Rate:  [85] 85 (06/21 0929) Resp:  [18] 18 (06/21 0929) BP: (147)/(83) 147/83 (06/21 0929) SpO2:  [96 %] 96 % (06/21 0929) Weight:  [220 lb (99.8 kg)] 220 lb (99.8 kg) (06/21 6270)   General:   Alert, well-developed male in NAD Psych:  Pleasant, cooperative. Normal mood and affect. Eyes:  Pupils equal, sclera clear, no icterus.   Conjunctiva pink. Ears:  Normal auditory acuity. Nose:  No deformity, discharge,  or lesions. Neck:  Supple; no masses Lungs:  Clear throughout to auscultation.   No wheezes, crackles, or rhonchi.  Heart:  Regular rate and rhythm; no murmurs, no edema Abdomen:  Soft, non-distended, minimal LLQ tenderness, BS active, no palp mass    Rectal:  Deferred  Msk:  Symmetrical without gross deformities. . Neurologic:  Alert and  oriented x4;  grossly normal neurologically. Skin:  Intact without significant lesions or rashes..   Intake/Output from previous day: No intake/output data recorded. Intake/Output this shift: No intake/output data recorded.  Lab Results: Recent Labs    04/10/18 1000  WBC 11.7*  HGB 13.5  HCT 40.2  PLT 307   BMET Recent Labs    04/10/18 1000  NA 139  K 4.0  CL 104  CO2 26    GLUCOSE 134*  BUN 14  CREATININE 1.02  CALCIUM 9.2   LFT Recent Labs    04/10/18 1000  PROT 8.3*  ALBUMIN 4.1  AST 25  ALT 32  ALKPHOS 73  BILITOT 1.2   Studies/Results: No results found.   Tye Savoy, NP-C @  04/10/2018, 10:59 AM

## 2018-04-10 NOTE — Consult Note (Addendum)
Referring Provider: [facility]    Primary Care Physician:  Wendie Agreste, MD Primary Gastroenterologist:   Silvano Rusk,  MD  Reason for Consultation:  Bloody diarrhea / abdominal pain / UC   ASSESSMENT AND PLAN:     37 yo male with 3 year hx of left sided UC presenting with flare type symptoms. He was doing well on Humira until a week ago. Now with bloody diarrhea and LLQ pain.  -outpatient C-diff negative. Repeat study already ordered.  -Solumedrol 40 mg Q am -He needs flex sig vrs colonoscopy this admission (he is scheduled for surveillance colonoscopy in July) -IV fluids -sips clears -anti-emetics.  -SCDs - at increased risk for DVTs.   2. GERD. PPI stopped days ago because of diarrhea. Currently asymptomatic after dietary changes.  -Pepcid 20 mg BID as needed    Attending physician's note   I have taken a history, examined the patient and reviewed the chart. I agree with the Advanced Practitioner's note, impression and recommendations. Acute bloody diarrhea with fevers. R/O UC flare vs infection. GI pathogen panel. C diff quick scan, recent C diff PCR was negative. Clear liquids, IVF, IV methylpredosonle 40 mg qd for now. Colonoscopy tomorrow.   Lucio Edward, MD [facility] [phone] office    HPI: John [name] is a 37 y.o. male, husband of Dottie one of the CMAs in our office. He has  [facility] of colon cancer and had a normal screening colonoscopy January 2016. In Sept 2016 he was diagnosed with C-diff after taking Clindamycin. Diarrhea didn't improve despite Vancomycin. He required hospitalization for persistent diarrhea and a peri-rectal abscess. Inpatient flex sigmoidoscopy in Sept revealed moderate to severe left sided UC. He had a reaction to Remicade, changed immediately to Humira which he takes Q 2 weeks, next dose due on Tuesday. Patient was doing well until about a week ago.  Now with 10-12 diarrheal stools a day.  He has LLQ cramping, mainly associated with  defecation. He has been in contact with our office. C-diff negative. . Stopped Omeprazole 6 days ago thinking it was cause of diarrhea but that didn't make any difference. He started Lomtoil  QID last week without much improvement .We started him on prednisone 3 days ago, still no improvement.  Fever 101.6 at home this am. No arthralgias, skin rashes, eye pain. No swelling or pain in legs. No urinary sx. Weight is stable.  ED evaluation Mildly hypertensive, VS o/w normal. Afebrile WBC 11.7 hgb 13.5 HCT 40 Liver chemistries normal Normal renal funtion   Past Medical History:  Diagnosis Date  . Acute anal fissure 10/27/2015  . Esophageal reflux 06/08/2014  . GERD (gastroesophageal reflux disease)   . Hemorrhoids, internal, with bleeding 06/08/2014  . Perirectal abscess 07/31/2015  . 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 UC (ulcerative colitis) ([place])   . Vitamin D deficiency 04/03/2016    Past Surgical History:  Procedure Laterality Date  . ESOPHAGOGASTRODUODENOSCOPY N/A 07/11/2015   Procedure: ESOPHAGOGASTRODUODENOSCOPY (EGD);  Surgeon: Jerene Bears, MD;  Location: [facility];  Service: Gastroenterology;  Laterality: N/A;  . FLEXIBLE SIGMOIDOSCOPY N/A 07/11/2015   Procedure: FLEXIBLE SIGMOIDOSCOPY;  Surgeon: Jerene Bears, MD;  Location: [facility];  Service: Gastroenterology;  Laterality: N/A;  . INCISION AND DRAINAGE PERIRECTAL ABSCESS N/A 07/24/2015   Procedure: IRRIGATION AND DEBRIDEMENT PERIRECTAL ABSCESS;  Surgeon: Alphonsa Overall, MD;  Location: [facility];  Service: General;  Laterality: N/A;  . KNEE ARTHROSCOPY Left   . TYMPANOSTOMY TUBE PLACEMENT Bilateral     Prior to Admission medications   Medication Sig Start Date End Date Taking? Authorizing Provider  Adalimumab (HUMIRA PEN) 40 MG/0.4ML PNKT Inject 40 mg into the skin every 14 (fourteen) days. After the completion of the starter kit Patient taking differently: Inject 40 mg into  the skin every 14 (fourteen) days.  03/05/18   Gatha Mayer, MD  Cholecalciferol (VITAMIN D) 2000 units CAPS Take 1 capsule by mouth daily.    [provider]  dicyclomine (BENTYL) 20 MG tablet Take 1 tablet (20 mg total) by mouth 4 (four) times daily -  before meals and at bedtime. 04/06/18   Gatha Mayer, MD  diphenoxylate-atropine (LOMOTIL) 2.5-0.025 MG tablet Take 1 tablet by mouth 4 (four) times daily as needed for diarrhea or loose stools. 04/06/18   Gatha Mayer, MD  loratadine (CLARITIN) 10 MG tablet Take 10 mg by mouth daily.    [provider]  Multiple Vitamins-Minerals (MULTIVITAMIN PO) Take 1 tablet by mouth daily.     [provider]  predniSONE (DELTASONE) 10 MG tablet Take 4 tablets (40 mg total) by mouth daily with breakfast. Then after 3 days reduce to 30 mg, further taper will follow from Dr. Carlean Purl 04/07/18   Gatha Mayer, MD  ranitidine (ZANTAC) 150 MG tablet Take 1 tablet (150 mg total) by mouth daily as needed for heartburn. 03/09/18   Gatha Mayer, MD    No current facility-administered medications for this encounter.    Current Outpatient Medications  Medication Sig Dispense Refill  . Adalimumab (HUMIRA PEN) 40 MG/0.4ML PNKT Inject 40 mg into the skin every 14 (fourteen) days. After the completion of the starter kit (Patient taking differently: Inject 40 mg into the skin every 14 (fourteen) days. ) 2 each 6  . Cholecalciferol (VITAMIN D) 2000 units CAPS Take 1 capsule by mouth daily.    Marland Kitchen dicyclomine (BENTYL) 20 MG tablet Take 1 tablet (20 mg total) by mouth 4 (four) times daily -  before meals and at bedtime. 120 tablet 1  . diphenoxylate-atropine (LOMOTIL) 2.5-0.025 MG tablet Take 1 tablet by mouth 4 (four) times daily as needed for diarrhea or loose stools. 30 tablet 0  . loratadine (CLARITIN) 10 MG tablet Take 10 mg by mouth daily.    . Multiple Vitamins-Minerals (MULTIVITAMIN PO) Take 1 tablet by mouth daily.     . predniSONE  (DELTASONE) 10 MG tablet Take 4 tablets (40 mg total) by mouth daily with breakfast. Then after 3 days reduce to 30 mg, further taper will follow from Dr. Carlean Purl 100 tablet 1  . ranitidine (ZANTAC) 150 MG tablet Take 1 tablet (150 mg total) by mouth daily as needed for heartburn.      Allergies as of 04/10/2018 - Review Complete 04/10/2018  Allergen Reaction Noted  . Amoxicillin Hives 02/06/2016  . Remicade [infliximab] Anaphylaxis 08/03/2015  . Lialda [mesalamine] Other (See Comments) 02/06/2016  . Ibuprofen  06/29/2015    Family History  Problem Relation Age of Onset  . Diabetes Father   . Colon cancer Father 24  . Heart disease Father   . Hyperlipidemia Father   . Heart disease Mother   . Hyperlipidemia Mother   . Heart disease Brother   . Heart disease Maternal Grandmother   . Heart disease Maternal Grandfather   . Mental illness Maternal Grandfather   . Heart disease Paternal Grandmother   .  Heart disease Paternal Grandfather   . Colon cancer Paternal Grandfather     Social History   Socioeconomic History  . Marital status: Married    Spouse name: Not on file  . Number of children: 2  . Years of education: Not on file  . Highest education level: Not on file  Occupational History  . Occupation: Designer, industrial/product: [employer]  . Financial resource strain: Not on file  . Food insecurity:    Worry: Not on file    Inability: Not on file  . Transportation needs:    Medical: Not on file    Non-medical: Not on file  Tobacco Use  . Smoking status: Never Smoker  . Smokeless tobacco: Never Used  Substance and Sexual Activity  . Alcohol use: No    Alcohol/week: 0.0 oz  . Drug use: No  . Sexual activity: Yes  Lifestyle  . Physical activity:    Days per week: Not on file    Minutes per session: Not on file  . Stress: Not on file  Relationships  . Social connections:    Talks on phone: Not on file    Gets together: Not on file     Attends religious service: Not on file    Active member of club or organization: Not on file    Attends meetings of clubs or organizations: Not on file    Relationship status: Not on file  . Intimate partner violence:    Fear of current or ex partner: Not on file    Emotionally abused: Not on file    Physically abused: Not on file    Forced sexual activity: Not on file  Other Topics Concern  . 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  Physical Exam: Vital signs in last 24 hours: Temp:  [98.7 F (37.1 C)] 98.7 F (37.1 C) (06/21 0929) Pulse Rate:  [85] 85 (06/21 0929) Resp:  [18] 18 (06/21 0929) BP: (147)/(83) 147/83 (06/21 0929) SpO2:  [96 %] 96 % (06/21 0929) Weight:  [220 lb (99.8 kg)] 220 lb (99.8 kg) (06/21 7867)   General:   Alert, well-developed male in NAD Psych:  Pleasant, cooperative. Normal mood and affect. Eyes:  Pupils equal, sclera clear, no icterus.   Conjunctiva pink. Ears:  Normal auditory acuity. Nose:  No deformity, discharge,  or lesions. Neck:  Supple; no masses Lungs:  Clear throughout to auscultation.   No wheezes, crackles, or rhonchi.  Heart:  Regular rate and rhythm; no murmurs, no edema Abdomen:  Soft, non-distended, minimal LLQ tenderness, BS active, no palp mass    Rectal:  Deferred  Msk:  Symmetrical without gross deformities. . Neurologic:  Alert and  oriented x4;  grossly normal neurologically. Skin:  Intact without significant lesions or rashes..   Intake/Output from previous day: No intake/output data recorded. Intake/Output this shift: No intake/output data recorded.  Lab Results: Recent Labs    04/10/18 1000  WBC 11.7*  HGB 13.5  HCT 40.2  PLT 307   BMET Recent Labs    04/10/18 1000  NA 139  K 4.0  CL 104  CO2 26    GLUCOSE 134*  BUN 14  CREATININE 1.02  CALCIUM 9.2   LFT Recent Labs    04/10/18 1000  PROT 8.3*  ALBUMIN 4.1  AST 25  ALT 32  ALKPHOS 73  BILITOT 1.2   Studies/Results: No results found.   Tye Savoy, NP-C @  04/10/2018, 10:59 AM

## 2018-04-10 NOTE — Telephone Encounter (Signed)
Persistent and worsening symptoms of bloody diarrhea, fever and nausea despite high dose prednisone x 2-3 d, C diff neg Lomotil also On Humira Had been well until recently starting omeprazole  Advised go to ED - Carla Drape will take him to [facility]  He will need to be admitted I suspect and to have a flex sig today/tomorrow  ? IBD flare vs infection not yet detected  He does have hx C diff in past but test neg in past week

## 2018-04-10 NOTE — ED Triage Notes (Signed)
Pt c/o bright red diarrhea stools since Saturday. Hx ulcerative colitis and see [facility] who advised pt to come in today and they will be following up.

## 2018-04-10 NOTE — Anesthesia Preprocedure Evaluation (Addendum)
Anesthesia Evaluation  Patient identified by MRN, date of birth, ID band Patient awake    Reviewed: Allergy & Precautions, NPO status , Patient's Chart, lab work & pertinent test results  Airway Mallampati: II  TM Distance: >3 FB Neck ROM: Full    Dental no notable dental hx. (+) Teeth Intact   Pulmonary neg pulmonary ROS,    Pulmonary exam normal breath sounds clear to auscultation       Cardiovascular negative cardio ROS Normal cardiovascular exam Rhythm:Regular Rate:Normal     Neuro/Psych Bilateral CTS  Neuromuscular disease negative psych ROS   GI/Hepatic PUD, GERD  ,Ulcerative colitis Bloody diarrhea   Endo/Other  Hypothyroidism Obesity Adrenocortical axis suppression - chronic steroid use  Renal/GU   negative genitourinary   Musculoskeletal  (+) Arthritis ,   Abdominal (+) + obese,   Peds  Hematology negative hematology ROS (+)   Anesthesia Other Findings   Reproductive/Obstetrics                           Anesthesia Physical Anesthesia Plan  ASA: II  Anesthesia Plan: MAC   Post-op Pain Management:    Induction:   PONV Risk Score and Plan: 1  Airway Management Planned: Natural Airway, Nasal Cannula and Simple Face Mask  Additional Equipment:   Intra-op Plan:   Post-operative Plan:   Informed Consent: I have reviewed the patients History and Physical, chart, labs and discussed the procedure including the risks, benefits and alternatives for the proposed anesthesia with the patient or authorized representative who has indicated his/her understanding and acceptance.   Dental advisory given  Plan Discussed with: CRNA  Anesthesia Plan Comments:        Anesthesia Quick Evaluation

## 2018-04-10 NOTE — ED Provider Notes (Signed)
[facility] DEPT Provider Note   CSN: [id number] Arrival date & time: 04/10/18  8341     History   Chief Complaint No chief complaint on file.   HPI John [name] is a 37 y.o. male with a hx of ulcerative colitis, anal fissure, perirectal abscess, internal hemorrhoids, C. Diff, hypothyroidism, and long-term immunosuppression on humira who presents to the ED at the request of his gastroenterologist for bloody diarrhea x 1 week. Patient states he is having diarrhea with bright red blood in it. He has had associated intermittent abdominal cramping to LLQ which seems to occur just prior to having a bowel movement, no pain at present.  Reports associated nausea without vomiting and subjective fevers as well. He called his Gastroenterologist Dr. Carlean Purl earlier this week, was started on high dose prednisone for possible UC flare and given Lomotil. His wife works for GI office, he has seemed to be getting worse as opposed to better, no specific alleviating/aggravating factors, however given lack of improvement patient and his wife spoke with Dr. Carlean Purl who has directed patient to the ER for admission and likely flexible sigmoidoscopy today/tomorrow. He did have negative C.diff testing 06/17. States he has hx of similar when he was initially diagnosed with UC. No specific alleviating/aggravating factors. He denies recent foreign travel, hospitalization, abx use, or suspicious food intake. Denies recent increase in NSAID intake or EtOH intake. Denies vomiting, dysuria, or testicular pain/swelling.   HPI  Past Medical History:  Diagnosis Date  . Acute anal fissure 10/27/2015  . Esophageal reflux 06/08/2014  . GERD (gastroesophageal reflux disease)   . Hemorrhoids, internal, with bleeding 06/08/2014  . Perirectal abscess 07/31/2015  . 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 UC (ulcerative colitis) ([place])   . Vitamin D deficiency  04/03/2016    Patient Active Problem List   Diagnosis Date Noted  . Bilateral carpal tunnel syndrome 12/11/2017  . Vitamin D deficiency 04/03/2016  . Long-term use of immunosuppressant medication - Humira 07/31/2015  . Ulcerative colitis ([place])   . Hypothyroidism 06/29/2015    Past Surgical History:  Procedure Laterality Date  . ESOPHAGOGASTRODUODENOSCOPY N/A 07/11/2015   Procedure: ESOPHAGOGASTRODUODENOSCOPY (EGD);  Surgeon: Jerene Bears, MD;  Location: [facility];  Service: Gastroenterology;  Laterality: N/A;  . FLEXIBLE SIGMOIDOSCOPY N/A 07/11/2015   Procedure: FLEXIBLE SIGMOIDOSCOPY;  Surgeon: Jerene Bears, MD;  Location: [facility];  Service: Gastroenterology;  Laterality: N/A;  . INCISION AND DRAINAGE PERIRECTAL ABSCESS N/A 07/24/2015   Procedure: IRRIGATION AND DEBRIDEMENT PERIRECTAL ABSCESS;  Surgeon: Alphonsa Overall, MD;  Location: [facility];  Service: General;  Laterality: N/A;  . KNEE ARTHROSCOPY Left   . TYMPANOSTOMY TUBE PLACEMENT Bilateral         Home Medications    Prior to Admission medications   Medication Sig Start Date End Date Taking? Authorizing Provider  Adalimumab (HUMIRA PEN) 40 MG/0.4ML PNKT Inject 40 mg into the skin every 14 (fourteen) days. After the completion of the starter kit Patient taking differently: Inject 40 mg into the skin every 14 (fourteen) days.  03/05/18   Gatha Mayer, MD  Cholecalciferol (VITAMIN D) 2000 units CAPS Take 1 capsule by mouth daily.    [provider]  dicyclomine (BENTYL) 20 MG tablet Take 1 tablet (20 mg total) by mouth 4 (four) times daily -  before meals and at bedtime. 04/06/18   Gatha Mayer, MD  diphenoxylate-atropine (LOMOTIL)  2.5-0.025 MG tablet Take 1 tablet by mouth 4 (four) times daily as needed for diarrhea or loose stools. 04/06/18   Gatha Mayer, MD  loratadine (CLARITIN) 10 MG tablet Take 10 mg by mouth daily.    [provider]  Multiple Vitamins-Minerals (MULTIVITAMIN PO) Take 1 tablet  by mouth daily.     [provider]  predniSONE (DELTASONE) 10 MG tablet Take 4 tablets (40 mg total) by mouth daily with breakfast. Then after 3 days reduce to 30 mg, further taper will follow from Dr. Carlean Purl 04/07/18   Gatha Mayer, MD  ranitidine (ZANTAC) 150 MG tablet Take 1 tablet (150 mg total) by mouth daily as needed for heartburn. 03/09/18   Gatha Mayer, MD    Family History Family History  Problem Relation Age of Onset  . Diabetes Father   . Colon cancer Father 61  . Heart disease Father   . Hyperlipidemia Father   . Heart disease Mother   . Hyperlipidemia Mother   . Heart disease Brother   . Heart disease Maternal Grandmother   . Heart disease Maternal Grandfather   . Mental illness Maternal Grandfather   . Heart disease Paternal Grandmother   . Heart disease Paternal Grandfather   . Colon cancer Paternal Grandfather     Social History Social History   Tobacco Use  . Smoking status: Never Smoker  . Smokeless tobacco: Never Used  Substance Use Topics  . Alcohol use: No    Alcohol/week: 0.0 oz  . Drug use: No     Allergies   Amoxicillin; Remicade [infliximab]; Lialda [mesalamine]; and Ibuprofen   Review of Systems Review of Systems  Constitutional: Positive for fever (subjective).  Respiratory: Negative for shortness of breath.   Cardiovascular: Negative for chest pain.  Gastrointestinal: Positive for abdominal pain, blood in stool, diarrhea and nausea. Negative for vomiting.  Genitourinary: Negative for dysuria, scrotal swelling and testicular pain.  All other systems reviewed and are negative.    Physical Exam Updated Vital Signs There were no vitals taken for this visit.  Physical Exam  Constitutional: He appears well-developed and well-nourished. No distress.  HENT:  Head: Normocephalic and atraumatic.  Eyes: Conjunctivae are normal. Right eye exhibits no discharge. Left eye exhibits no discharge.  Cardiovascular: Normal rate and  regular rhythm.  No murmur heard. Pulmonary/Chest: Breath sounds normal. No respiratory distress. He has no wheezes. He has no rales.  Abdominal: Soft. He exhibits no distension. There is tenderness (mild) in the left lower quadrant. There is no rigidity, no rebound, no guarding, no CVA tenderness and no tenderness at McBurney's point.  Genitourinary: Rectal exam shows guaiac positive stool. Rectal exam shows no external hemorrhoid and no fissure.  Genitourinary Comments: Soft brown stool. EDT present as chaperone.   Neurological: He is alert.  Clear speech.   Skin: Skin is warm and dry. No rash noted.  Psychiatric: He has a normal mood and affect. His behavior is normal.  Nursing note and vitals reviewed.   ED Treatments / Results  Labs Results for orders placed or performed during the hospital encounter of 04/10/18  Comprehensive metabolic panel  Result Value Ref Range   Sodium 139 135 - 145 mmol/L   Potassium 4.0 3.5 - 5.1 mmol/L   Chloride 104 101 - 111 mmol/L   CO2 26 22 - 32 mmol/L   Glucose, Bld 134 (H) 65 - 99 mg/dL   BUN 14 6 - 20 mg/dL   Creatinine, Ser 1.02  0.61 - 1.24 mg/dL   Calcium 9.2 8.9 - 10.3 mg/dL   Total Protein 8.3 (H) 6.5 - 8.1 g/dL   Albumin 4.1 3.5 - 5.0 g/dL   AST 25 15 - 41 U/L   ALT 32 17 - 63 U/L   Alkaline Phosphatase 73 38 - 126 U/L   Total Bilirubin 1.2 0.3 - 1.2 mg/dL   GFR calc non Af Amer >60 >60 mL/min   GFR calc Af Amer >60 >60 mL/min   Anion gap 9 5 - 15  Lipase, blood  Result Value Ref Range   Lipase 21 11 - 51 U/L  CBC with Diff  Result Value Ref Range   WBC 11.7 (H) 4.0 - 10.5 K/uL   RBC 4.46 4.22 - 5.81 MIL/uL   Hemoglobin 13.5 13.0 - 17.0 g/dL   HCT 40.2 39.0 - 52.0 %   MCV 90.1 78.0 - 100.0 fL   MCH 30.3 26.0 - 34.0 pg   MCHC 33.6 30.0 - 36.0 g/dL   RDW 13.5 11.5 - 15.5 %   Platelets 307 150 - 400 K/uL   Neutrophils Relative % 77 %   Neutro Abs 9.0 (H) 1.7 - 7.7 K/uL   Lymphocytes Relative 11 %   Lymphs Abs 1.3 0.7 - 4.0  K/uL   Monocytes Relative 11 %   Monocytes Absolute 1.3 (H) 0.1 - 1.0 K/uL   Eosinophils Relative 1 %   Eosinophils Absolute 0.1 0.0 - 0.7 K/uL   Basophils Relative 0 %   Basophils Absolute 0.0 0.0 - 0.1 K/uL  Urinalysis, Routine w reflex microscopic  Result Value Ref Range   Color, Urine YELLOW YELLOW   APPearance CLEAR CLEAR   Specific Gravity, Urine 1.010 1.005 - 1.030   pH 7.0 5.0 - 8.0   Glucose, UA NEGATIVE NEGATIVE mg/dL   Hgb urine dipstick NEGATIVE NEGATIVE   Bilirubin Urine NEGATIVE NEGATIVE   Ketones, ur NEGATIVE NEGATIVE mg/dL   Protein, ur NEGATIVE NEGATIVE mg/dL   Nitrite NEGATIVE NEGATIVE   Leukocytes, UA NEGATIVE NEGATIVE  POC occult blood, ED  Result Value Ref Range   Fecal Occult Bld POSITIVE (A) NEGATIVE   No results found. EKG None  Radiology No results found.  Procedures Procedures (including critical care time)  Medications Ordered in ED Medications - No data to display   Initial Impression / Assessment and Plan / ED Course  I have reviewed the triage vital signs and the nursing notes.  Pertinent labs & imaging results that were available during my care of the patient were reviewed by me and considered in my medical decision making (see chart for details).   Patient with hx of ulcerative colitis presents to the ED at the request of his gastroenterologist for bloody diarrhea, abdominal cramping, and subjective fevers. Patient nontoxic appearing, in no apparent distress, vitals WNL other than elevated blood pressure- doubt HTN emergency. Patient with very mild LLQ abdominal tenderness on exam, no peritoneal signs. Patient's wife on the phone with with Tye Savoy ACNP-BC who works with Dr. Carlean Purl during initial evaluation- I was able to speak with her directly- requesting labs, she feels less likely that patient requires imaging, more likely requires the flex sigmoidoscopy, however she is planning to come to [facility] ER to evaluate the patient as soon as  she is done at [facility]. Given lack of peritoneal signs, will defer to gastroenterology NP exam given she is more familiar with the patient, will obtain labs and hold off on imaging at  this time. DDX: ulcerative colitis flare, infectious diarrhea, pancreatitis, cholecystitis, cancerous etiology.   Labs notable for leukocytosis at 11.7 with left shift. No anemia. No significant electrolyte abnormalities. Hyperglycemia at 134. Renal fxn, LFTs, and lipase WNL. UA without obvious infection. Fecal occult positive, fairly benign rectal exam otherwise.   11:15: Gastroenterology NP Tye Savoy at bedside evaluating patient, does not feel CT imaging is necessary at this time, I feel this is reasonable and am in agreement.. She is requesting patient be admitted to hospitalist service with plans for flexible sigmoidoscopy vs. full colonoscopy. Dr. Carlean Purl will come to see the patient.   11:25: CONSULT: Discussed case with hospitalist Dr. Maylene Roes who accepts admission.   Final Clinical Impressions(s) / ED Diagnoses   Final diagnoses:  Diarrhea, unspecified type    ED Discharge Orders    None       Amaryllis Dyke, PA-C 04/10/18 1308    Hayden Rasmussen, MD 04/12/18 1105

## 2018-04-11 ENCOUNTER — Inpatient Hospital Stay (HOSPITAL_COMMUNITY): Payer: Managed Care, Other (non HMO) | Admitting: Certified Registered Nurse Anesthetist

## 2018-04-11 ENCOUNTER — Encounter (HOSPITAL_COMMUNITY)
Admission: EM | Disposition: A | Discharge status: Home / Self Care | Payer: Self-pay | Source: Home / Self Care | Attending: Internal Medicine

## 2018-04-11 ENCOUNTER — Encounter (HOSPITAL_COMMUNITY): Payer: Self-pay

## 2018-04-11 DIAGNOSIS — D72829 Elevated white blood cell count, unspecified: Secondary | ICD-10-CM

## 2018-04-11 DIAGNOSIS — R739 Hyperglycemia, unspecified: Secondary | ICD-10-CM

## 2018-04-11 HISTORY — PX: COLONOSCOPY: SHX5424

## 2018-04-11 HISTORY — PX: BIOPSY: SHX5522

## 2018-04-11 LAB — GASTROINTESTINAL PANEL BY PCR, STOOL (REPLACES STOOL CULTURE)

## 2018-04-11 LAB — CBC
HCT: 39.2 % (ref 39.0–52.0)
Hemoglobin: 13.2 g/dL (ref 13.0–17.0)
MCH: 30.6 pg (ref 26.0–34.0)
MCHC: 33.7 g/dL (ref 30.0–36.0)
MCV: 90.7 fL (ref 78.0–100.0)
Platelets: 341 10*3/uL (ref 150–400)
RBC: 4.32 MIL/uL (ref 4.22–5.81)
RDW: 13.4 % (ref 11.5–15.5)
WBC: 11.5 10*3/uL — ABNORMAL HIGH (ref 4.0–10.5)

## 2018-04-11 LAB — COMPREHENSIVE METABOLIC PANEL
ALT: 37 U/L (ref 17–63)
AST: 25 U/L (ref 15–41)
Albumin: 3.5 g/dL (ref 3.5–5.0)
Alkaline Phosphatase: 68 U/L (ref 38–126)
Anion gap: 8 (ref 5–15)
BUN: 13 mg/dL (ref 6–20)
CO2: 29 mmol/L (ref 22–32)
Calcium: 8.9 mg/dL (ref 8.9–10.3)
Chloride: 104 mmol/L (ref 101–111)
Creatinine, Ser: 0.92 mg/dL (ref 0.61–1.24)
GFR calc Af Amer: >60 mL/min (ref >60)
GFR calc non Af Amer: >60 mL/min (ref >60)
Glucose, Bld: 115 mg/dL — ABNORMAL HIGH (ref 65–99)
Potassium: 4 mmol/L (ref 3.5–5.1)
Sodium: 141 mmol/L (ref 135–145)
Total Bilirubin: 0.5 mg/dL (ref 0.3–1.2)
Total Protein: 7.7 g/dL (ref 6.5–8.1)

## 2018-04-11 LAB — MAGNESIUM: Magnesium: 2.2 mg/dL (ref 1.7–2.4)

## 2018-04-11 LAB — PHOSPHORUS: Phosphorus: 3.5 mg/dL (ref 2.5–4.6)

## 2018-04-11 LAB — HIV ANTIBODY (ROUTINE TESTING W REFLEX): HIV Screen 4th Generation wRfx: NONREACTIVE

## 2018-04-11 SURGERY — COLONOSCOPY
Anesthesia: Monitor Anesthesia Care

## 2018-04-11 MED ORDER — PROPOFOL 500 MG/50ML IV EMUL
INTRAVENOUS | Status: DC | PRN
  Administered 2018-04-11: 150 ug/kg/min via INTRAVENOUS

## 2018-04-11 MED ORDER — GLYCOPYRROLATE PF 0.2 MG/ML IJ SOSY: PREFILLED_SYRINGE | INTRAMUSCULAR | Status: DC | PRN

## 2018-04-11 MED ORDER — LACTATED RINGERS IV SOLN: INTRAVENOUS | Status: DC | PRN

## 2018-04-11 MED ORDER — PROPOFOL 10 MG/ML IV BOLUS
INTRAVENOUS | Status: AC
  Filled 2018-04-11: qty 40

## 2018-04-11 MED ORDER — GLYCOPYRROLATE PF 0.2 MG/ML IJ SOSY
PREFILLED_SYRINGE | INTRAMUSCULAR | Status: DC | PRN
  Administered 2018-04-11: .2 mg via INTRAVENOUS

## 2018-04-11 MED ORDER — PROPOFOL 10 MG/ML IV BOLUS
INTRAVENOUS | Status: DC | PRN
  Administered 2018-04-11 (×3): 30 mg via INTRAVENOUS

## 2018-04-11 MED ORDER — METHYLPREDNISOLONE SODIUM SUCC 40 MG IJ SOLR
30.0000 mg | Freq: Two times a day (BID) | INTRAMUSCULAR | Status: DC
  Administered 2018-04-11 – 2018-04-17 (×13): 30 mg via INTRAVENOUS
  Filled 2018-04-11 (×13): qty 1

## 2018-04-11 MED ORDER — DICYCLOMINE HCL 10 MG PO CAPS
20.0000 mg | ORAL_CAPSULE | Freq: Three times a day (TID) | ORAL | Status: DC
  Administered 2018-04-11 – 2018-04-14 (×12): 20 mg via ORAL
  Filled 2018-04-11 (×12): qty 2

## 2018-04-11 NOTE — Progress Notes (Addendum)
PROGRESS NOTE    John [name]  TKZ:[id number] DOB: 1980-11-14 DOA: 04/10/2018 PCP: Wendie Agreste, MD   Brief Narrative:  John [name] is a 37 y.o. male with medical history significant of ulcerative colitis diagnosed in 2016 currently on Humira who presented with bloody diarrhea over the past week.  Also admits to left lower quadrant cramping with diarrhea.  He had a fever this morning at home as well as chills.  He admits to nausea without vomiting.  No other chest pain, cough, shortness of breath.  He was started on prednisone as an outpatient by Dr. Carlean Purl, however patient has been having continued bloody diarrhea, fever, nausea despite outpatient treatment.  He was referred to be evaluated in the emergency department.  Gastroenterology was consulted and he underwent colonoscopy today which showed severe left-sided colitis symptoms consistent with Ulcerative Colitis flare but biopsies are currently pending.  We will continue empiric IV steroids at 30 mg of Solu-Medrol q. 12 and follow GI recommendations.  Assessment & Plan:   Principal Problem:   Bloody diarrhea Active Problems:   Ulcerative colitis (HCC)  Bloody Diarrhea likely UC flare rule out Infectious Etiology   -Currently taking Humira as outpatient -Follows with Dr. Carlean Purl in [place]  -Gastroenterology consulted and patient started on IV Solumedrol 30 mg q12h -Pain Control with Hydrocodone-Acetaminophen 1-2 tablets po q4hprn Moderate Pain -Antiemetics with Ondansetron 4 mg po/IV q6hprn -C/w Famotidine 20 mg po BID and Dicyclomine 20 mg po TID before meals and bedtime -C/w IVF Hydration with NS at 100 mL/hr -C Difficile Negative -GI Pathogen Panel pending; C/w Enteric Precautions -Colonoscopy done and showed Left-sided colitis. Inflammation was found from  the anus to the transverse colon. This was severe. Dr. Fuller Plan biopsied the area. -GI recommending colonoscopy date to be determined after pathology results are reviewed  and pending continue clear liquid diet for today current medications as well as Solu-Medrol 40 mg IV every 12 -Continue to Monitor and repeat CBC in AM to monitor Hb/Hct -C/w SCDs  Leukocytosis -Likely in the setting of ulcerative colitis flare -WBC went from 11.7 and is now 11.5; expect WBC to elevate in the setting of IV steroid demargination -Continue to monitor for signs and symptoms of infection -Repeat CBC in a.m.  Hyperglycemia -Likely reactive as Blood Sugar was 134, but has been on po Prednisone so may be cause and expect to elevate even more in the setting of IV Steroid Use  -Repeat CMP in same not drawn yet -Check hemoglobin A1c -Continue to monitor blood sugars and if remains consistently elevated will place on sensitive NovoLog sliding scale insulin  GERD -Continue with famotidine 20 mg p.o. twice daily -Patient's PPI was stopped a few days ago because of diarrhea  DVT prophylaxis: SCDs Code Status: FULL CODE Family Communication: Discussed with wife at bedside Disposition Plan: [place] for current work-up and treatment and D/C home when medically stable and cleared by GI  Consultants:   [facility]    Procedures:  COLONOSCOPY      The perianal and digital rectal examinations were normal.      Inflammation characterized by altered vascularity, congestion (edema),       erosions, erythema, friability, granularity and loss of vascularity was       found in a continuous and circumferential pattern from the anus to the       transverse colon. More severe in several areas in the sigmoid,       descending and  transverse. No sites were spared. This was severe.       Biopsies were taken with a cold forceps for histology. Retroflexion not       performed due to severity of inflammation. Otherwise unremarkable. Impression:               - Left-sided colitis. Inflammation was found from                            the anus to the transverse colon. This  was severe.                            Biopsied.  Antimicrobials:  Anti-infectives (From admission, onward)   None     Subjective: Seen and examined at bedside and was having some nausea as well as some crampy abdominal pain.  No chest pain, shortness breath, nausea, vomiting however is wearing supplemental oxygen via nasal cannula right after he was coming from PACU.  No other concerns or complaints at this time and doing okay.  Objective: Vitals:   04/10/18 1213 04/10/18 2015 04/11/18 0548 04/11/18 0724  BP: (!) 150/86 125/79 132/76 130/69  Pulse: 71 68 67 79  Resp: 16 17 16 15   Temp: 98.3 F (36.8 C) 98.2 F (36.8 C) 98.5 F (36.9 C) 98.7 F (37.1 C)  TempSrc: Oral Oral Oral Oral  SpO2: 96% 93% 95% 98%  Weight:      Height:        Intake/Output Summary (Last 24 hours) at 04/11/2018 0932 Last data filed at 04/11/2018 3557 Gross per 24 hour  Intake 3541.66 ml  Output 15 ml  Net 3526.66 ml   Filed Weights   04/10/18 0928  Weight: 99.8 kg (220 lb)   Examination: Physical Exam:  Constitutional: WN/WD obese Caucasian male in NAD and appears calm and comfortable Eyes:  Lids and conjunctivae normal, sclerae anicteric  ENMT: External Ears, Nose appear normal. Grossly normal hearing.  Neck: Appears normal, supple, no cervical masses, normal ROM, no appreciable thyromegaly, no JVD Respiratory: Diminished to auscultation bilaterally, no wheezing, rales, rhonchi or crackles. Normal respiratory effort and patient is not tachypenic. No accessory muscle use.  Cardiovascular: RRR, no murmurs / rubs / gallops. S1 and S2 auscultated. No extremity edema.  Abdomen: Soft, slightly tender, Distended due to body habitus. No masses palpated. No appreciable hepatosplenomegaly. Bowel sounds positive x4.  GU: Deferred. Musculoskeletal: No clubbing / cyanosis of digits/nails. No joint deformity upper and lower extremities.  Skin: No rashes, lesions, ulcers on a limited skin evaluation. No  induration; Warm and dry.  Neurologic: CN 2-12 grossly intact with no focal deficits. Romberg sign and cerebellar reflexes not assessed.  Psychiatric: Normal judgment and insight. Alert and oriented x 3. Normal mood and appropriate affect.   Data Reviewed: I have personally reviewed following labs and imaging studies  CBC: Recent Labs  Lab 04/10/18 1000 04/11/18 0608  WBC 11.7* 11.5*  NEUTROABS 9.0*  --   HGB 13.5 13.2  HCT 40.2 39.2  MCV 90.1 90.7  PLT 307 322   Basic Metabolic Panel: Recent Labs  Lab 04/10/18 1000  NA 139  K 4.0  CL 104  CO2 26  GLUCOSE 134*  BUN 14  CREATININE 1.02  CALCIUM 9.2   GFR: Estimated Creatinine Clearance: 105.6 mL/min (by C-G formula based on SCr of 1.02 mg/dL). Liver Function Tests: Recent Labs  Lab 04/10/18  1000  AST 25  ALT 32  ALKPHOS 73  BILITOT 1.2  PROT 8.3*  ALBUMIN 4.1   Recent Labs  Lab 04/10/18 1000  LIPASE 21   No results for input(s): AMMONIA in the last 168 hours. Coagulation Profile: No results for input(s): INR, PROTIME in the last 168 hours. Cardiac Enzymes: No results for input(s): CKTOTAL, CKMB, CKMBINDEX, TROPONINI in the last 168 hours. BNP (last 3 results) No results for input(s): PROBNP in the last 8760 hours. HbA1C: No results for input(s): HGBA1C in the last 72 hours. CBG: No results for input(s): GLUCAP in the last 168 hours. Lipid Profile: No results for input(s): CHOL, HDL, LDLCALC, TRIG, CHOLHDL, LDLDIRECT in the last 72 hours. Thyroid Function Tests: No results for input(s): TSH, T4TOTAL, FREET4, T3FREE, THYROIDAB in the last 72 hours. Anemia Panel: No results for input(s): VITAMINB12, FOLATE, FERRITIN, TIBC, IRON, RETICCTPCT in the last 72 hours. Sepsis Labs: No results for input(s): PROCALCITON, LATICACIDVEN in the last 168 hours.  Recent Results (from the past 240 hour(s))  C difficile quick scan w PCR reflex     Status: None   Collection Time: 04/10/18  3:33 PM  Result Value Ref  Range Status   C Diff antigen NEGATIVE NEGATIVE Final   C Diff toxin NEGATIVE NEGATIVE Final   C Diff interpretation No C. difficile detected.  Final    Comment: Performed at [facility], [place] [address] [place], [place] [address]    Radiology Studies: No results found.   Scheduled Meds: . [MAR Hold] methylPREDNISolone (SOLU-MEDROL) injection  40 mg Intravenous q morning - 10a   Continuous Infusions: . sodium chloride 100 mL/hr at 04/11/18 0211    LOS: 1 day    Kerney Elbe, DO [facility] Pager [phone]  If 7PM-7AM, please contact night-coverage [email or web address] Password [facility] 04/11/2018, 8:42 AM

## 2018-04-11 NOTE — Op Note (Signed)
[facility] Patient Name: John [name] Procedure Date: 04/11/2018 MRN: [id number] Attending MD: Ladene Artist , MD Date of Birth: [birth year] CSN: [id number] Age: 37 Admit Type: Inpatient Procedure:                Colonoscopy Indications:              Determine extent and severity of inflammatory bowel                            disease, Diarrhea (presumed secondary to ulcerative                            colitis), Hematochezia Providers:                Pricilla Riffle. Fuller Plan, MD, Zenon Mayo, RN, Charolette Child, Technician, Christell Faith, CRNA Referring MD:             [facility] Medicines:                Monitored Anesthesia Care Complications:            No immediate complications. Estimated blood loss:                            None. Estimated Blood Loss:     Estimated blood loss was minimal. Procedure:                Pre-Anesthesia Assessment:                           - Prior to the procedure, a History and Physical                            was performed, and patient medications and                            allergies were reviewed. The patient's tolerance of                            previous anesthesia was also reviewed. The risks                            and benefits of the procedure and the sedation                            options and risks were discussed with the patient.                            All questions were answered, and informed consent                            was obtained. Prior Anticoagulants: The patient has                            taken  no previous anticoagulant or antiplatelet                            agents. ASA Grade Assessment: II - A patient with                            mild systemic disease. After reviewing the risks                            and benefits, the patient was deemed in                            satisfactory condition to undergo the procedure.                           After  obtaining informed consent, the colonoscope                            was passed under direct vision. Throughout the                            procedure, the patient's blood pressure, pulse, and                            oxygen saturations were monitored continuously. The                            EC-[id number] ([id number]) scope was introduced through                            the anus and advanced to the transverse colon. Exam                            discontinued a this point due to the severity of                            inflammation. The rectum was photographed. The                            quality of the bowel preparation was adequate. The                            colonoscopy was performed without difficulty. The                            patient tolerated the procedure well. Scope In: 8:26:57 AM Scope Out: 8:37:41 AM Total Procedure Duration: 0 hours 10 minutes 44 seconds  Findings:      The perianal and digital rectal examinations were normal.      Inflammation characterized by altered vascularity, congestion (edema),       erosions, erythema, friability, granularity and loss of vascularity was       found in a continuous and circumferential pattern from the anus to the       transverse colon. More  severe in several areas in the sigmoid,       descending and transverse. No sites were spared. This was severe.       Biopsies were taken with a cold forceps for histology. Retroflexion not       performed due to severity of inflammation. Otherwise unremarkable. Impression:               - Left-sided colitis. Inflammation was found from                            the anus to the transverse colon. This was severe.                            Biopsied. Moderate Sedation:      N/A- Per Anesthesia Care Recommendation:           - Repeat colonoscopy date to be determined after                            pending pathology results are reviewed.                           - Patient has  a contact number available for                            emergencies. The signs and symptoms of potential                            delayed complications were discussed with the                            patient. Return to normal activities tomorrow.                            Written discharge instructions were provided to the                            patient.                           - Clear liquid diet.                           - Continue present medications.                           - Methylprednisolone 30 mg IV q12h                           - Await pathology results. Procedure Code(s):        --- Professional ---                           [phone], Colonoscopy, flexible; with biopsy, single                            or multiple Diagnosis Code(s):        ---  Professional ---                           K52.9, Noninfective gastroenteritis and colitis,                            unspecified                           K52.3, Indeterminate colitis                           R19.7, Diarrhea, unspecified                           K92.1, Melena (includes Hematochezia) CPT copyright 2017 [employer]. All rights reserved. The codes documented in this report are preliminary and upon coder review may  be revised to meet current compliance requirements. Ladene Artist, MD 04/11/2018 8:47:51 AM This report has been signed electronically. Number of Addenda: 0

## 2018-04-11 NOTE — Transfer of Care (Signed)
Immediate Anesthesia Transfer of Care Note  Patient: [place]i A Gimbel  Procedure(s) Performed: COLONOSCOPY (N/A ) BIOPSY  Patient Location: PACU  Anesthesia Type:MAC  Level of Consciousness: awake, alert , oriented and patient cooperative  Airway & Oxygen Therapy: Patient Spontanous Breathing and Patient connected to nasal cannula oxygen  Post-op Assessment: Report given to RN and Post -op Vital signs reviewed and stable  Post vital signs: Reviewed and stable  Last Vitals:  Vitals Value Taken Time  BP    Temp    Pulse 68 04/11/2018  8:45 AM  Resp 15 04/11/2018  8:45 AM  SpO2 99 % 04/11/2018  8:45 AM  Vitals shown include unvalidated device data.  Last Pain:  Vitals:   04/11/18 0724  TempSrc: Oral  PainSc: 0-No pain         Complications: No apparent anesthesia complications

## 2018-04-11 NOTE — Anesthesia Procedure Notes (Signed)
Procedure Name: MAC Date/Time: 04/11/2018 8:16 AM Performed by: West Pugh, CRNA Pre-anesthesia Checklist: Patient identified, Emergency Drugs available, Suction available, Patient being monitored and Timeout performed Patient Re-evaluated:Patient Re-evaluated prior to induction Oxygen Delivery Method: Nasal cannula Induction Type: IV induction Dental Injury: Teeth and Oropharynx as per pre-operative assessment

## 2018-04-11 NOTE — Anesthesia Postprocedure Evaluation (Signed)
Anesthesia Post Note  Patient: John [name]  Procedure(s) Performed: COLONOSCOPY (N/A ) BIOPSY     Patient location during evaluation: PACU Anesthesia Type: MAC Level of consciousness: awake and alert and oriented Pain management: pain level controlled Vital Signs Assessment: post-procedure vital signs reviewed and stable Respiratory status: spontaneous breathing, nonlabored ventilation and respiratory function stable Cardiovascular status: stable and blood pressure returned to baseline Postop Assessment: no apparent nausea or vomiting Anesthetic complications: no    Last Vitals:  Vitals:   04/11/18 0845 04/11/18 0850  BP: (!) 98/36 (!) 101/40  Pulse: 71 65  Resp: 17 17  Temp: 36.8 C   SpO2: 97% 98%    Last Pain:  Vitals:   04/11/18 0850  TempSrc:   PainSc: 0-No pain                 Brodee Mauritz A.

## 2018-04-11 NOTE — Interval H&P Note (Signed)
History and Physical Interval Note:  04/11/2018 8:12 AM  John [name]  has presented today for surgery, with the diagnosis of Ulcerative colitis. Bloody diarrhea  The various methods of treatment have been discussed with the patient and family. After consideration of risks, benefits and other options for treatment, the patient has consented to  Procedure(s): COLONOSCOPY (N/A) as a surgical intervention .  The patient's history has been reviewed, patient examined, no change in status, stable for surgery.  I have reviewed the patient's chart and labs.  Questions were answered to the patient's satisfaction.     Pricilla Riffle. Fuller Plan

## 2018-04-11 NOTE — Progress Notes (Signed)
Pt. returned from Endoscopy via bed. Alert and oriented x 4. Spouse at bedside, no respiratory distress noted.

## 2018-04-12 DIAGNOSIS — K51811 Other ulcerative colitis with rectal bleeding: Secondary | ICD-10-CM

## 2018-04-12 LAB — CBC WITH DIFFERENTIAL/PLATELET
Basophils Absolute: 0 10*3/uL (ref 0.0–0.1)
Basophils Relative: 0 %
Eosinophils Absolute: 0 10*3/uL (ref 0.0–0.7)
Eosinophils Relative: 0 %
HCT: 39.6 % (ref 39.0–52.0)
Hemoglobin: 13.4 g/dL (ref 13.0–17.0)
Lymphocytes Relative: 15 %
Lymphs Abs: 1.8 10*3/uL (ref 0.7–4.0)
MCH: 30.2 pg (ref 26.0–34.0)
MCHC: 33.8 g/dL (ref 30.0–36.0)
MCV: 89.4 fL (ref 78.0–100.0)
Monocytes Absolute: 1.2 10*3/uL — ABNORMAL HIGH (ref 0.1–1.0)
Monocytes Relative: 10 %
Neutro Abs: 8.9 10*3/uL — ABNORMAL HIGH (ref 1.7–7.7)
Neutrophils Relative %: 75 %
Platelets: 391 10*3/uL (ref 150–400)
RBC: 4.43 MIL/uL (ref 4.22–5.81)
RDW: 13.3 % (ref 11.5–15.5)
WBC: 11.9 10*3/uL — ABNORMAL HIGH (ref 4.0–10.5)

## 2018-04-12 LAB — COMPREHENSIVE METABOLIC PANEL
ALT: 38 U/L (ref 17–63)
AST: 20 U/L (ref 15–41)
Albumin: 3.4 g/dL — ABNORMAL LOW (ref 3.5–5.0)
Alkaline Phosphatase: 66 U/L (ref 38–126)
Anion gap: 8 (ref 5–15)
BUN: 12 mg/dL (ref 6–20)
CO2: 27 mmol/L (ref 22–32)
Calcium: 9 mg/dL (ref 8.9–10.3)
Chloride: 104 mmol/L (ref 101–111)
Creatinine, Ser: 0.87 mg/dL (ref 0.61–1.24)
GFR calc Af Amer: >60 mL/min (ref >60)
GFR calc non Af Amer: >60 mL/min (ref >60)
Glucose, Bld: 127 mg/dL — ABNORMAL HIGH (ref 65–99)
Potassium: 4.3 mmol/L (ref 3.5–5.1)
Sodium: 139 mmol/L (ref 135–145)
Total Bilirubin: 0.6 mg/dL (ref 0.3–1.2)
Total Protein: 7.4 g/dL (ref 6.5–8.1)

## 2018-04-12 LAB — HEMOGLOBIN A1C
Hgb A1c MFr Bld: 5.6 % (ref 4.8–5.6)
Mean Plasma Glucose: 114.02 mg/dL

## 2018-04-12 LAB — MAGNESIUM: Magnesium: 2 mg/dL (ref 1.7–2.4)

## 2018-04-12 LAB — PHOSPHORUS: Phosphorus: 3.9 mg/dL (ref 2.5–4.6)

## 2018-04-12 NOTE — Progress Notes (Addendum)
    Progress Note   Subjective   Small volume, frequent diarrhea with less blood noted. Intermittent crampy abdominal  pain with diarrhea is unchanged   Objective  Vital signs in last 24 hours: Temp:  [98.4 F (36.9 C)-99.4 F (37.4 C)] 98.4 F (36.9 C) (06/23 0542) Pulse Rate:  [62-73] 72 (06/23 0542) Resp:  [18] 18 (06/23 0542) BP: (113-123)/(65-81) 113/74 (06/23 0542) SpO2:  [94 %-96 %] 95 % (06/23 0542) Last BM Date: 04/11/18  General: Alert, well-developed, in NAD Heart:  Regular rate and rhythm; no murmurs Chest: Clear to ascultation bilaterally Abdomen:  Soft, nontender and nondistended. Normal bowel sounds, without guarding, and without rebound.   Extremities:  Without edema. Neurologic:  Alert and  oriented x4; grossly normal neurologically. Psych:  Alert and cooperative. Normal mood and affect.  Intake/Output from previous day: 06/22 0701 - 06/23 0700 In: 1842.4 [P.O.:1080; I.V.:762.4] Out: -  Intake/Output this shift: Total I/O In: 400 [I.V.:400] Out: -   Lab Results: Recent Labs    04/10/18 1000 04/11/18 0608 04/12/18 0548  WBC 11.7* 11.5* 11.9*  HGB 13.5 13.2 13.4  HCT 40.2 39.2 39.6  PLT 307 341 391   BMET Recent Labs    04/10/18 1000 04/11/18 0608 04/12/18 0548  NA 139 141 139  K 4.0 4.0 4.3  CL 104 104 104  CO2 26 29 27   GLUCOSE 134* 115* 127*  BUN 14 13 12   CREATININE 1.02 0.92 0.87  CALCIUM 9.2 8.9 9.0   LFT Recent Labs    04/12/18 0548  PROT 7.4  ALBUMIN 3.4*  AST 20  ALT 38  ALKPHOS 66  BILITOT 0.6     Assessment & Plan    1. Ulcerative colitis flare. GI pathogen panel negative. C diff negative. Persistent mildly elevated WBC and glucose. HbA1c is normal. No fevers since admission. Awaiting colon biopsy report. Continue clear liquids and IV methylprednisolone. Will discuss current and future mgmt options with Dr. Carlean Purl who will assume GI hospital care for John [name] on Monday.     LOS: 2 days   Norberto Sorenson T. Fuller Plan MD  04/12/2018, 11:58 AM

## 2018-04-12 NOTE — Addendum Note (Signed)
Addendum  created 04/12/18 0305 by West Pugh, CRNA   Intraprocedure Flowsheets edited

## 2018-04-12 NOTE — Progress Notes (Signed)
PROGRESS NOTE    John A Schwark  HWK:[id number] DOB: [birth year] DOA: 04/10/2018 PCP: Wendie Agreste, MD   Brief Narrative:  John [name] is a 37 y.o. male with medical history significant of ulcerative colitis diagnosed in 2016 currently on Humira who presented with bloody diarrhea over the past week.  Also admits to left lower quadrant cramping with diarrhea.  He had a fever this morning at home as well as chills.  He admits to nausea without vomiting.  No other chest pain, cough, shortness of breath.  He was started on prednisone as an outpatient by Dr. Carlean Purl, however patient has been having continued bloody diarrhea, fever, nausea despite outpatient treatment.  He was referred to be evaluated in the emergency department.  Gastroenterology was consulted and he underwent colonoscopy today which showed severe left-sided colitis symptoms consistent with Ulcerative Colitis flare but biopsies are currently pending.  We will continue empiric IV steroids at 30 mg of Solu-Medrol q. 12 and follow GI recommendations.  Assessment & Plan:   Principal Problem:   Bloody diarrhea Active Problems:   Ulcerative colitis (HCC)  Bloody Diarrhea from Ulcerative Colitis Flare  -Currently was taking Humira as outpatient -Follows with Dr. Carlean Purl in [facility]  -Gastroenterology consulted and patient started on IV Solumedrol 30 mg q12h and recommending continuing today  -Pain Control with Hydrocodone-Acetaminophen 1-2 tablets po q4hprn Moderate Pain -Antiemetics with Ondansetron 4 mg po/IV q6hprn -C/w Famotidine 20 mg po BID and Dicyclomine 20 mg po TID before meals and bedtime -C/w IVF Hydration with NS at 100 mL/hr reduced to 75 mL/hr -C Difficile Negative -GI Pathogen Panel Negative as well; Enteric Precautions Discontinued -Colonoscopy done and showed Left-sided colitis. Inflammation was found from  the anus to the transverse colon. This was severe. Dr. Fuller Plan biopsied the area. -GI recommending  colonoscopy date to be determined after pathology results are reviewed and pending  -Continue clear liquid diet for today current medications as well as Solu-Medrol 40 mg IV every 12hours per Dr. Fuller Plan -Continue to Monitor and repeat CBC in AM to monitor Hb/Hct -C/w SCDs -Follow Biopsy results   Leukocytosis -Likely in the setting of ulcerative colitis flare -WBC went from 11.7 and is now 11.5; expected WBC to elevate in the setting of IV steroid demargination and is now 11.9 -Continue to monitor for signs and symptoms of infection -Repeat CBC in a.m.  Hyperglycemia -Likely reactive as Blood Sugar was 134, but has been on po Prednisone so may be cause and expect to elevate even more in the setting of IV Steroid Use  -Checked Hemoglobin A1c and was 5.6 -Continue to monitor blood sugars and if remains consistently elevated will place on sensitive NovoLog sliding scale insulin  GERD -Continue with famotidine 20 mg p.o. twice daily -Patient's PPI was stopped a few days ago because of diarrhea  DVT prophylaxis: SCDs Code Status: FULL CODE Family Communication: Discussed with wife at bedside Disposition Plan: [place] for current work-up and treatment and D/C home when medically stable and cleared by GI  Consultants:   [facility]    Procedures:  COLONOSCOPY      The perianal and digital rectal examinations were normal.      Inflammation characterized by altered vascularity, congestion (edema),       erosions, erythema, friability, granularity and loss of vascularity was       found in a continuous and circumferential pattern from the anus to the       transverse colon.  More severe in several areas in the sigmoid,       descending and transverse. No sites were spared. This was severe.       Biopsies were taken with a cold forceps for histology. Retroflexion not       performed due to severity of inflammation. Otherwise unremarkable. Impression:               -  Left-sided colitis. Inflammation was found from                            the anus to the transverse colon. This was severe.                            Biopsied.  Antimicrobials:  Anti-infectives (From admission, onward)   None     Subjective: Seen and examined at bedside and he was still having diarrhea and felt that it slightly worsened but denied any blood.  No chest pain, shortness breath, nausea, vomiting.  Tolerating clear liquid diet for now.  Objective: Vitals:   04/11/18 1508 04/11/18 1742 04/11/18 1900 04/12/18 0542  BP: 121/75 123/65 123/81 113/74  Pulse: 68 73 62 72  Resp: 18  18 18   Temp: 99.4 F (37.4 C)  99 F (37.2 C) 98.4 F (36.9 C)  TempSrc: Oral  Oral Oral  SpO2: 96% 95% 94% 95%  Weight:      Height:        Intake/Output Summary (Last 24 hours) at 04/12/2018 1238 Last data filed at 04/12/2018 1000 Gross per 24 hour  Intake 1405 ml  Output -  Net 1405 ml   Filed Weights   04/10/18 0928  Weight: 99.8 kg (220 lb)   Examination: Physical Exam:  Constitutional: Well-nourished, well-developed obese Caucasian male currently no acute distress appears calm and appears comfortable laying in bed  Eyes: Lids and conjunctive are normal.  Sclera anicteric. ENMT: Ears and nose appear normal.  Grossly normal hearing Neck: Neck is supple with no appreciable JVD Respiratory: Clear to auscultation bilaterally with no appreciable wheezing, rales, rhonchi.  Patient is not tachypneic using accessory muscle breathe Cardiovascular: Rate and rhythm.  No appreciable murmurs, rubs or gallops.  No lower extremity edema noted Abdomen: Soft, slightly tender.  Distended secondary body habitus.  Bowel sounds present all 4 quadrants GU: Deferred Musculoskeletal: No contractures or stenosis.  No joint deformities noted Skin: Skin is warm and dry with no appreciable rashes or lesions on limited skin evaluation Neurologic: Cranial nerves II through XII grossly intact no appreciable  focal deficits Psychiatric: Normal mood and affect.  Intact judgment insight.  Patient is awake, alert and oriented x3  Data Reviewed: I have personally reviewed following labs and imaging studies  CBC: Recent Labs  Lab 04/10/18 1000 04/11/18 0608 04/12/18 0548  WBC 11.7* 11.5* 11.9*  NEUTROABS 9.0*  --  8.9*  HGB 13.5 13.2 13.4  HCT 40.2 39.2 39.6  MCV 90.1 90.7 89.4  PLT 307 341 220   Basic Metabolic Panel: Recent Labs  Lab 04/10/18 1000 04/11/18 0608 04/12/18 0548  NA 139 141 139  K 4.0 4.0 4.3  CL 104 104 104  CO2 26 29 27   GLUCOSE 134* 115* 127*  BUN 14 13 12   CREATININE 1.02 0.92 0.87  CALCIUM 9.2 8.9 9.0  MG  --  2.2 2.0  PHOS  --  3.5 3.9   GFR:  Estimated Creatinine Clearance: 123.8 mL/min (by C-G formula based on SCr of 0.87 mg/dL). Liver Function Tests: Recent Labs  Lab 04/10/18 1000 04/11/18 0608 04/12/18 0548  AST 25 25 20   ALT 32 37 38  ALKPHOS 73 68 66  BILITOT 1.2 0.5 0.6  PROT 8.3* 7.7 7.4  ALBUMIN 4.1 3.5 3.4*   Recent Labs  Lab 04/10/18 1000  LIPASE 21   No results for input(s): AMMONIA in the last 168 hours. Coagulation Profile: No results for input(s): INR, PROTIME in the last 168 hours. Cardiac Enzymes: No results for input(s): CKTOTAL, CKMB, CKMBINDEX, TROPONINI in the last 168 hours. BNP (last 3 results) No results for input(s): PROBNP in the last 8760 hours. HbA1C: Recent Labs    04/12/18 0548  HGBA1C 5.6   CBG: No results for input(s): GLUCAP in the last 168 hours. Lipid Profile: No results for input(s): CHOL, HDL, LDLCALC, TRIG, CHOLHDL, LDLDIRECT in the last 72 hours. Thyroid Function Tests: No results for input(s): TSH, T4TOTAL, FREET4, T3FREE, THYROIDAB in the last 72 hours. Anemia Panel: No results for input(s): VITAMINB12, FOLATE, FERRITIN, TIBC, IRON, RETICCTPCT in the last 72 hours. Sepsis Labs: No results for input(s): PROCALCITON, LATICACIDVEN in the last 168 hours.  Recent Results (from the past 240  hour(s))  Gastrointestinal Panel by PCR , Stool     Status: None   Collection Time: 04/10/18  3:33 PM  Result Value Ref Range Status   Campylobacter species NOT DETECTED NOT DETECTED Final   Plesimonas shigelloides NOT DETECTED NOT DETECTED Final   Salmonella species NOT DETECTED NOT DETECTED Final   Yersinia enterocolitica NOT DETECTED NOT DETECTED Final   Vibrio species NOT DETECTED NOT DETECTED Final   Vibrio cholerae NOT DETECTED NOT DETECTED Final   Enteroaggregative E coli (EAEC) NOT DETECTED NOT DETECTED Final   Enteropathogenic E coli (EPEC) NOT DETECTED NOT DETECTED Final   Enterotoxigenic E coli (ETEC) NOT DETECTED NOT DETECTED Final   Shiga like toxin producing E coli (STEC) NOT DETECTED NOT DETECTED Final   Shigella/Enteroinvasive E coli (EIEC) NOT DETECTED NOT DETECTED Final   Cryptosporidium NOT DETECTED NOT DETECTED Final   Cyclospora cayetanensis NOT DETECTED NOT DETECTED Final   Entamoeba histolytica NOT DETECTED NOT DETECTED Final   Giardia lamblia NOT DETECTED NOT DETECTED Final   Adenovirus F40/41 NOT DETECTED NOT DETECTED Final   Astrovirus NOT DETECTED NOT DETECTED Final   Norovirus GI/GII NOT DETECTED NOT DETECTED Final   Rotavirus A NOT DETECTED NOT DETECTED Final   Sapovirus (I, II, IV, and V) NOT DETECTED NOT DETECTED Final    Comment: Performed at [facility], [address] [place], [place] [address]  C difficile quick scan w PCR reflex     Status: None   Collection Time: 04/10/18  3:33 PM  Result Value Ref Range Status   C Diff antigen NEGATIVE NEGATIVE Final   C Diff toxin NEGATIVE NEGATIVE Final   C Diff interpretation No C. difficile detected.  Final    Comment: Performed at [facility], [place] [address], [place], [place] [address]    Radiology Studies: No results found.   Scheduled Meds: . dicyclomine  20 mg Oral TID AC & HS  . methylPREDNISolone (SOLU-MEDROL) injection  30 mg Intravenous Q12H   Continuous  Infusions: . sodium chloride 75 mL/hr at 04/12/18 1052    LOS: 2 days    Kerney Elbe, DO [facility] Pager [phone]  If 7PM-7AM, please contact night-coverage [email or web address] Password [email or web address]  04/12/2018, 12:38 PM

## 2018-04-13 ENCOUNTER — Encounter (HOSPITAL_COMMUNITY): Payer: Self-pay | Admitting: Gastroenterology

## 2018-04-13 DIAGNOSIS — K51011 Ulcerative (chronic) pancolitis with rectal bleeding: Secondary | ICD-10-CM

## 2018-04-13 LAB — CBC WITH DIFFERENTIAL/PLATELET
Basophils Absolute: 0 10*3/uL (ref 0.0–0.1)
Basophils Relative: 0 %
Eosinophils Absolute: 0 10*3/uL (ref 0.0–0.7)
Eosinophils Relative: 0 %
HCT: 41.8 % (ref 39.0–52.0)
Hemoglobin: 14.2 g/dL (ref 13.0–17.0)
Lymphocytes Relative: 13 %
Lymphs Abs: 2.1 10*3/uL (ref 0.7–4.0)
MCH: 30.1 pg (ref 26.0–34.0)
MCHC: 34 g/dL (ref 30.0–36.0)
MCV: 88.7 fL (ref 78.0–100.0)
Monocytes Absolute: 1.3 10*3/uL — ABNORMAL HIGH (ref 0.1–1.0)
Monocytes Relative: 8 %
Neutro Abs: 12.4 10*3/uL — ABNORMAL HIGH (ref 1.7–7.7)
Neutrophils Relative %: 79 %
Platelets: 444 10*3/uL — ABNORMAL HIGH (ref 150–400)
RBC: 4.71 MIL/uL (ref 4.22–5.81)
RDW: 13.4 % (ref 11.5–15.5)
WBC: 15.8 10*3/uL — ABNORMAL HIGH (ref 4.0–10.5)

## 2018-04-13 LAB — COMPREHENSIVE METABOLIC PANEL
ALT: 32 U/L (ref 17–63)
AST: 15 U/L (ref 15–41)
Albumin: 3.6 g/dL (ref 3.5–5.0)
Alkaline Phosphatase: 70 U/L (ref 38–126)
Anion gap: 10 (ref 5–15)
BUN: 15 mg/dL (ref 6–20)
CO2: 26 mmol/L (ref 22–32)
Calcium: 9.3 mg/dL (ref 8.9–10.3)
Chloride: 102 mmol/L (ref 101–111)
Creatinine, Ser: 0.95 mg/dL (ref 0.61–1.24)
GFR calc Af Amer: >60 mL/min (ref >60)
GFR calc non Af Amer: >60 mL/min (ref >60)
Glucose, Bld: 119 mg/dL — ABNORMAL HIGH (ref 65–99)
Potassium: 4.1 mmol/L (ref 3.5–5.1)
Sodium: 138 mmol/L (ref 135–145)
Total Bilirubin: 0.7 mg/dL (ref 0.3–1.2)
Total Protein: 7.9 g/dL (ref 6.5–8.1)

## 2018-04-13 LAB — PHOSPHORUS: Phosphorus: 4.4 mg/dL (ref 2.5–4.6)

## 2018-04-13 LAB — MAGNESIUM: Magnesium: 2.1 mg/dL (ref 1.7–2.4)

## 2018-04-13 MED ORDER — FAMOTIDINE 20 MG PO TABS
20.0000 mg | ORAL_TABLET | Freq: Two times a day (BID) | ORAL | Status: DC
  Administered 2018-04-13 (×2): 20 mg via ORAL
  Filled 2018-04-13 (×2): qty 1

## 2018-04-13 MED ORDER — ENOXAPARIN SODIUM 40 MG/0.4ML ~~LOC~~ SOLN
40.0000 mg | SUBCUTANEOUS | Status: DC
  Administered 2018-04-13 – 2018-04-19 (×7): 40 mg via SUBCUTANEOUS
  Filled 2018-04-13 (×7): qty 0.4

## 2018-04-13 MED ORDER — DIPHENOXYLATE-ATROPINE 2.5-0.025 MG PO TABS
1.0000 | ORAL_TABLET | Freq: Four times a day (QID) | ORAL | Status: DC
  Administered 2018-04-13 – 2018-04-15 (×10): 1 via ORAL
  Filled 2018-04-13 (×10): qty 1

## 2018-04-13 NOTE — Progress Notes (Signed)
PROGRESS NOTE    John [name]  ZRA:[id number] DOB: 13-Feb-1981 DOA: 04/10/2018 PCP: Wendie Agreste, MD   Brief Narrative:  John [name] is a 37 y.o. male with medical history significant of ulcerative colitis diagnosed in 2016 currently on Humira who presented with bloody diarrhea over the past week.  Also admits to left lower quadrant cramping with diarrhea.  He had a fever this morning at home as well as chills.  He admits to nausea without vomiting.  No other chest pain, cough, shortness of breath.  He was started on prednisone as an outpatient by Dr. Carlean Purl, however patient has been having continued bloody diarrhea, fever, nausea despite outpatient treatment.  He was referred to be evaluated in the emergency department.  Gastroenterology was consulted and he underwent colonoscopy today which showed severe left-sided colitis symptoms consistent with Ulcerative Colitis flare but biopsies are currently pending.  We will continue empiric IV steroids at 30 mg of Solu-Medrol q. 12h and follow GI recommendations. Still awaiting biopsy results and GI starting Lomotil today.   Assessment & Plan:   Principal Problem:   Bloody diarrhea Active Problems:   Ulcerative colitis (HCC)  Bloody Diarrhea from Ulcerative Colitis Flare, stable   -Currently was taking Humira as outpatient -Follows with Dr. Carlean Purl in [facility]  -Gastroenterology consulted and patient started on IV Solumedrol 30 mg q12h and recommending continuing today  -Pain Control with Hydrocodone-Acetaminophen 1-2 tablets po q4hprn Moderate Pain -Antiemetics with Ondansetron 4 mg po/IV q6hprn -C/w Famotidine 20 mg po BID and Dicyclomine 20 mg po TID before meals and bedtime -C/w IVF Hydration with NS at 100 mL/hr reduced to 75 mL/hr yesterday and will reduce to 50 mL/hr today  -C Difficile Negative -GI Pathogen Panel Negative as well; Enteric Precautions Discontinued -Colonoscopy done and showed Left-sided colitis. Inflammation was  found from  the anus to the transverse colon. This was severe. Dr. Fuller Plan biopsied the area. -GI recommending colonoscopy date to be determined after pathology results are reviewed and pending  -Continue clear liquid diet for today current medications as well as Solu-Medrol 40 mg IV every 12hours per Dr. Fuller Plan -GI Adding Diphenoxylate-Atropine 2.5-0.025 mg 1 tab po 4 times daily -Continue to Monitor and repeat CBC in AM to monitor Hb/Hct -C/w SCDs -Awaiting Biopsy results   Leukocytosis -Likely in the setting of ulcerative colitis flare -WBC went from 11.7 and is now 11.5; expected WBC to elevate in the setting of IV steroid demargination and is now 15.8 -Continue to monitor for signs and symptoms of infection -Repeat CBC in a.m.  Hyperglycemia -Likely reactive as Blood Sugar was 134, but has been on po Prednisone so may be cause and expect to elevate even more in the setting of IV Steroid Use  -Checked Hemoglobin A1c and was 5.6 -Continue to monitor blood sugars and if remains consistently elevated will place on sensitive NovoLog sliding scale insulin -Blood Sugar on BMP/CMP ranging from 115-127  GERD -Continue with Famotidine 20 mg p.o. twice daily -Patient's PPI was stopped a few days ago because of diarrhea  Thrombocytosis -Patient's Platelet Count has been elevating  -Likely Reactive and in the setting of IV Steroids -Platelet Count went from 307 -> 341 -> 391 -> 444 -Continue to Monitor and Repeat CBC in AM    DVT prophylaxis: SCDs Code Status: FULL CODE Family Communication: No Family present at bedside  Disposition Plan: Remain Inpatient for current work-up and treatment and D/C home when medically stable and cleared by  GI  Consultants:   [facility]    Procedures:  COLONOSCOPY      The perianal and digital rectal examinations were normal.      Inflammation characterized by altered vascularity, congestion (edema),       erosions, erythema, friability,  granularity and loss of vascularity was       found in a continuous and circumferential pattern from the anus to the       transverse colon. More severe in several areas in the sigmoid,       descending and transverse. No sites were spared. This was severe.       Biopsies were taken with a cold forceps for histology. Retroflexion not       performed due to severity of inflammation. Otherwise unremarkable. Impression:               - Left-sided colitis. Inflammation was found from                            the anus to the transverse colon. This was severe.                            Biopsied.  Antimicrobials:  Anti-infectives (From admission, onward)   None     Subjective: Seen and examined at bedside and he was still having some diarrhea with blood in it. No CP or SOB. No Nausea or Vomiting. No other concerns or complaints at this time.  Objective: Vitals:   04/12/18 0542 04/12/18 1443 04/12/18 2009 04/13/18 0537  BP: 113/74 136/80 111/62 139/83  Pulse: 72 63 62 (!) 57  Resp: 18 18 18 18   Temp: 98.4 F (36.9 C) 99.1 F (37.3 C) 99 F (37.2 C) 98.2 F (36.8 C)  TempSrc: Oral Oral Oral Oral  SpO2: 95% 94% 95% 99%  Weight:      Height:        Intake/Output Summary (Last 24 hours) at 04/13/2018 1041 Last data filed at 04/13/2018 4174 Gross per 24 hour  Intake 834.17 ml  Output -  Net 834.17 ml   Filed Weights   04/10/18 0928  Weight: 99.8 kg (220 lb)   Examination: Physical Exam:  Constitutional: Well developed obese Caucasian male who is currently in no acute distress and appears calm and trouble laying in bed watching television Eyes: Lids and conjunctive are normal.  Sclera are anicteric ENMT: External ears and nose appear normal.  Grossly normal hearing Neck: Neck is supple with no appreciable JVD Respiratory: Clear to auscultation bilaterally with no appreciable wheezing, rales, rhonchi.  Patient is not tachypneic using accessory muscles to  breathe Cardiovascular: Regular rate and rhythm.  No appreciable murmurs, rubs, gallops; no lower extremity edema. Abdomen: Soft, nontender, distended secondary body habitus.  Bowel sounds present all 4 quadrants GU: Deferred Musculoskeletal: No contractures or cyanosis.  No joint deformities noted Skin: Skin is warm and dry no appreciable rashes or lesions limited skin evaluation Neurologic: Cranial nerves II through XII grossly intact no appreciable focal deficits  Psychiatric: Normal mood and affect.  Intact judgment insight.  Patient is awake, alert, oriented x3  Data Reviewed: I have personally reviewed following labs and imaging studies  CBC: Recent Labs  Lab 04/10/18 1000 04/11/18 0608 04/12/18 0548 04/13/18 0545  WBC 11.7* 11.5* 11.9* 15.8*  NEUTROABS 9.0*  --  8.9* 12.4*  HGB 13.5 13.2 13.4 14.2  HCT  40.2 39.2 39.6 41.8  MCV 90.1 90.7 89.4 88.7  PLT 307 341 391 517*   Basic Metabolic Panel: Recent Labs  Lab 04/10/18 1000 04/11/18 0608 04/12/18 0548 04/13/18 0545  NA 139 141 139 138  K 4.0 4.0 4.3 4.1  CL 104 104 104 102  CO2 26 29 27 26   GLUCOSE 134* 115* 127* 119*  BUN 14 13 12 15   CREATININE 1.02 0.92 0.87 0.95  CALCIUM 9.2 8.9 9.0 9.3  MG  --  2.2 2.0 2.1  PHOS  --  3.5 3.9 4.4   GFR: Estimated Creatinine Clearance: 113.4 mL/min (by C-G formula based on SCr of 0.95 mg/dL). Liver Function Tests: Recent Labs  Lab 04/10/18 1000 04/11/18 0608 04/12/18 0548 04/13/18 0545  AST 25 25 20 15   ALT 32 37 38 32  ALKPHOS 73 68 66 70  BILITOT 1.2 0.5 0.6 0.7  PROT 8.3* 7.7 7.4 7.9  ALBUMIN 4.1 3.5 3.4* 3.6   Recent Labs  Lab 04/10/18 1000  LIPASE 21   No results for input(s): AMMONIA in the last 168 hours. Coagulation Profile: No results for input(s): INR, PROTIME in the last 168 hours. Cardiac Enzymes: No results for input(s): CKTOTAL, CKMB, CKMBINDEX, TROPONINI in the last 168 hours. BNP (last 3 results) No results for input(s): PROBNP in the last  8760 hours. HbA1C: Recent Labs    04/12/18 0548  HGBA1C 5.6   CBG: No results for input(s): GLUCAP in the last 168 hours. Lipid Profile: No results for input(s): CHOL, HDL, LDLCALC, TRIG, CHOLHDL, LDLDIRECT in the last 72 hours. Thyroid Function Tests: No results for input(s): TSH, T4TOTAL, FREET4, T3FREE, THYROIDAB in the last 72 hours. Anemia Panel: No results for input(s): VITAMINB12, FOLATE, FERRITIN, TIBC, IRON, RETICCTPCT in the last 72 hours. Sepsis Labs: No results for input(s): PROCALCITON, LATICACIDVEN in the last 168 hours.  Recent Results (from the past 240 hour(s))  Gastrointestinal Panel by PCR , Stool     Status: None   Collection Time: 04/10/18  3:33 PM  Result Value Ref Range Status   Campylobacter species NOT DETECTED NOT DETECTED Final   Plesimonas shigelloides NOT DETECTED NOT DETECTED Final   Salmonella species NOT DETECTED NOT DETECTED Final   Yersinia enterocolitica NOT DETECTED NOT DETECTED Final   Vibrio species NOT DETECTED NOT DETECTED Final   Vibrio cholerae NOT DETECTED NOT DETECTED Final   Enteroaggregative E coli (EAEC) NOT DETECTED NOT DETECTED Final   Enteropathogenic E coli (EPEC) NOT DETECTED NOT DETECTED Final   Enterotoxigenic E coli (ETEC) NOT DETECTED NOT DETECTED Final   Shiga like toxin producing E coli (STEC) NOT DETECTED NOT DETECTED Final   Shigella/Enteroinvasive E coli (EIEC) NOT DETECTED NOT DETECTED Final   Cryptosporidium NOT DETECTED NOT DETECTED Final   Cyclospora cayetanensis NOT DETECTED NOT DETECTED Final   Entamoeba histolytica NOT DETECTED NOT DETECTED Final   Giardia lamblia NOT DETECTED NOT DETECTED Final   Adenovirus F40/41 NOT DETECTED NOT DETECTED Final   Astrovirus NOT DETECTED NOT DETECTED Final   Norovirus GI/GII NOT DETECTED NOT DETECTED Final   Rotavirus A NOT DETECTED NOT DETECTED Final   Sapovirus (I, II, IV, and V) NOT DETECTED NOT DETECTED Final    Comment: Performed at [facility], [address] [place], [place] [address]  C difficile quick scan w PCR reflex     Status: None   Collection Time: 04/10/18  3:33 PM  Result Value Ref Range Status   C Diff antigen NEGATIVE  NEGATIVE Final   C Diff toxin NEGATIVE NEGATIVE Final   C Diff interpretation No C. difficile detected.  Final    Comment: Performed at [facility], [place] [address], [place],  [address]    Radiology Studies: No results found.   Scheduled Meds: . dicyclomine  20 mg Oral TID AC & HS  . diphenoxylate-atropine  1 tablet Oral QID  . famotidine  20 mg Oral BID  . methylPREDNISolone (SOLU-MEDROL) injection  30 mg Intravenous Q12H   Continuous Infusions: . sodium chloride 75 mL/hr at 04/13/18 0109    LOS: 3 days    Kerney Elbe, DO [facility] Pager [phone]  If 7PM-7AM, please contact night-coverage [email or web address] Password [email or web address] 04/13/2018, 10:41 AM

## 2018-04-13 NOTE — Progress Notes (Signed)
Patient is having increased abdominal cramping. MD paged to make aware. Awaiting response. Will continue to monitor.

## 2018-04-13 NOTE — Progress Notes (Addendum)
    Progress Note   Assessment / Plan:   Assessment: 1.  Ulcerative colitis flare: On Humira as outpatient, currently on IV Solu-Medrol 30 mg every 12 hours, colonoscopy 04/11/2018-biopsies pending  Plan: 1. Continue IV steroids for now 2. Continue other supportive measures 3.  Biopsy still pending from recent colonoscopy 4. Please await any further recommendations from Dr. Carlean Purl later today  Thank you for your kind consultation, we will continue to follow along   LOS: 3 days   Levin Erp  04/13/2018, 9:15 AM  Pager # [phone]    [facility] Attending   I have taken an interval history, reviewed the chart and examined the patient. I agree with the Advanced Practitioner's note, impression and recommendations.   He is better than admit  We started Lomotil  I prefer Lovenox in IBD flare re: DVT prophylaxis - he is bleeding but not much Hgb NL  He is concerned about coming back quickly after dc as that has happened in past  Hoping home 1-2 d but he is still having freq small stools w/ blood  Some cramps also  I have ordered TPMT and Hep B screening (repeat) in anticipation of outpatient 6MP and Entyvio  For now IV steroids  Will try to advance diet - bowel rest does not help disease - may cause some sxs but he wants to try solid food  Hgb A1C 5.6 noteed - possible long-term issue - want to focus on UC now  I am ok to take him on my service if hospitalist desires  Gatha Mayer, MD, [facility] 04/13/2018 3:44 PM   Subjective  Chief Complaint: UC flare  This morning, reports that he is doing may be slightly better than when he came in as far as his bloody bowel movements and diarrhea are concerned.  Does continue with some abdominal pain before a bowel movement which is typically relieved afterwards.   Objective   Vital signs in last 24 hours: Temp:  [98.2 F (36.8 C)-99.1 F (37.3 C)] 98.2 F (36.8 C) (06/24 0537) Pulse  Rate:  [57-63] 57 (06/24 0537) Resp:  [18] 18 (06/24 0537) BP: (111-139)/(62-83) 139/83 (06/24 0537) SpO2:  [94 %-99 %] 99 % (06/24 0537) Last BM Date: 04/12/18 General:    Caucasian male in NAD Heart:  Regular rate and rhythm; no murmurs Lungs: Respirations even and unlabored, lungs CTA bilaterally Abdomen:  Soft, mild generalized ttp and nondistended. Normal bowel sounds. Extremities:  Without edema. Neurologic:  Alert and oriented,  grossly normal neurologically. Psych:  Cooperative. Normal mood and affect.   Lab Results: Recent Labs    04/11/18 0608 04/12/18 0548 04/13/18 0545  WBC 11.5* 11.9* 15.8*  HGB 13.2 13.4 14.2  HCT 39.2 39.6 41.8  PLT 341 391 444*   BMET Recent Labs    04/11/18 0608 04/12/18 0548 04/13/18 0545  NA 141 139 138  K 4.0 4.3 4.1  CL 104 104 102  CO2 29 27 26   GLUCOSE 115* 127* 119*  BUN 13 12 15   CREATININE 0.92 0.87 0.95  CALCIUM 8.9 9.0 9.3   LFT Recent Labs    04/13/18 0545  PROT 7.9  ALBUMIN 3.6  AST 15  ALT 32  ALKPHOS 70  BILITOT 0.7

## 2018-04-14 ENCOUNTER — Telehealth: Payer: Self-pay

## 2018-04-14 LAB — CBC WITH DIFFERENTIAL/PLATELET
Basophils Absolute: 0 10*3/uL (ref 0.0–0.1)
Basophils Relative: 0 %
Eosinophils Absolute: 0 10*3/uL (ref 0.0–0.7)
Eosinophils Relative: 0 %
HCT: 41.1 % (ref 39.0–52.0)
Hemoglobin: 14.1 g/dL (ref 13.0–17.0)
Lymphocytes Relative: 11 %
Lymphs Abs: 1.7 10*3/uL (ref 0.7–4.0)
MCH: 30.3 pg (ref 26.0–34.0)
MCHC: 34.3 g/dL (ref 30.0–36.0)
MCV: 88.4 fL (ref 78.0–100.0)
Monocytes Absolute: 1.5 10*3/uL — ABNORMAL HIGH (ref 0.1–1.0)
Monocytes Relative: 9 %
Neutro Abs: 12.6 10*3/uL — ABNORMAL HIGH (ref 1.7–7.7)
Neutrophils Relative %: 80 %
Platelets: 438 10*3/uL — ABNORMAL HIGH (ref 150–400)
RBC: 4.65 MIL/uL (ref 4.22–5.81)
RDW: 13.5 % (ref 11.5–15.5)
WBC: 15.7 10*3/uL — ABNORMAL HIGH (ref 4.0–10.5)

## 2018-04-14 LAB — COMPREHENSIVE METABOLIC PANEL
ALT: 23 U/L (ref 0–44)
AST: 11 U/L — ABNORMAL LOW (ref 15–41)
Albumin: 3.4 g/dL — ABNORMAL LOW (ref 3.5–5.0)
Alkaline Phosphatase: 69 U/L (ref 38–126)
Anion gap: 10 (ref 5–15)
BUN: 18 mg/dL (ref 6–20)
CO2: 27 mmol/L (ref 22–32)
Calcium: 9.2 mg/dL (ref 8.9–10.3)
Chloride: 102 mmol/L (ref 98–111)
Creatinine, Ser: 0.93 mg/dL (ref 0.61–1.24)
GFR calc Af Amer: >60 mL/min (ref >60)
GFR calc non Af Amer: >60 mL/min (ref >60)
Glucose, Bld: 125 mg/dL — ABNORMAL HIGH (ref 70–99)
Potassium: 4.1 mmol/L (ref 3.5–5.1)
Sodium: 139 mmol/L (ref 135–145)
Total Bilirubin: 0.8 mg/dL (ref 0.3–1.2)
Total Protein: 7.6 g/dL (ref 6.5–8.1)

## 2018-04-14 LAB — PHOSPHORUS: Phosphorus: 4.8 mg/dL — ABNORMAL HIGH (ref 2.5–4.6)

## 2018-04-14 LAB — MAGNESIUM: Magnesium: 2.1 mg/dL (ref 1.7–2.4)

## 2018-04-14 MED ORDER — ONDANSETRON HCL 4 MG PO TABS
4.0000 mg | ORAL_TABLET | Freq: Three times a day (TID) | ORAL | Status: DC
  Administered 2018-04-14 – 2018-04-20 (×17): 4 mg via ORAL
  Filled 2018-04-14 (×17): qty 1

## 2018-04-14 MED ORDER — LORAZEPAM 1 MG PO TABS
1.0000 mg | ORAL_TABLET | Freq: Every day | ORAL | Status: DC
  Administered 2018-04-14: 1 mg via ORAL
  Filled 2018-04-14: qty 1

## 2018-04-14 MED ORDER — LORAZEPAM 1 MG PO TABS
1.0000 mg | ORAL_TABLET | ORAL | Status: DC
  Administered 2018-04-14 – 2018-04-15 (×4): 1 mg via ORAL
  Filled 2018-04-14 (×5): qty 1

## 2018-04-14 MED ORDER — PANTOPRAZOLE SODIUM 40 MG PO TBEC
40.0000 mg | DELAYED_RELEASE_TABLET | Freq: Every day | ORAL | Status: DC
  Administered 2018-04-14 – 2018-04-20 (×6): 40 mg via ORAL
  Filled 2018-04-14 (×6): qty 1

## 2018-04-14 NOTE — Progress Notes (Addendum)
    Progress Note   Assessment / Plan:   Assessment: 1.  Ulcerative colitis flare: On Humira as outpatient, currently on IV Solu-Medrol 30 mg every 12 hours, colonoscopy 04/11/2018  Plan: 1.  Continue IV steroids 2.  Discontinued Bentyl today 3.  Continue Lomotil scheduled 4 times daily 4.  Patient was started on Ativan p.o. 1 mg 5.  Continue soft diet 6.  We are going to start 6MP and Entyvio for the patient- Hep B and TPMT ordered yesterday - these meds to start outpatient 7. Path still pending from recent colo 8.  Please await further recommendations from Dr. Carlean Purl  Please await any further recommendations from Dr. Carlean Purl later today.    LOS: 4 days   Levin Erp  04/14/2018, 10:26 AM  Pager # [phone]  I have seen the patient also and agree with the above.  He is better overall but has persisent symptoms requiring IV medication - steroids  Hopefully ready for dc 1-2 days - would like to see him ok on 24 hrs oral prednisone optimally - thinking 30 mg bid.  Also started scheduled Zofran po  Gatha Mayer, MD, [facility] 04/14/2018 2:13 PM    Subjective  Chief Complaint: Ulcerative colitis flare  This morning patient explains that he was up early this morning with terrible nausea and felt as though he may vomit/sweating over the toilet.  He did eat a regular diet last night for the first time and wonders if this could have caused his symptoms this morning.  Has already had 3 loose stools this morning but has had a decrease in blood.   Objective   Vital signs in last 24 hours: Temp:  [98.6 F (37 C)-98.8 F (37.1 C)] 98.6 F (37 C) (06/25 0514) Pulse Rate:  [54-66] 61 (06/25 0514) Resp:  [18] 18 (06/25 0514) BP: (116-137)/(71-81) 137/81 (06/25 0514) SpO2:  [95 %-97 %] 95 % (06/25 0514) Last BM Date: 04/14/18 General:    White male in NAD Heart:  Regular rate and rhythm; no murmurs Lungs: Respirations even and unlabored,  lungs CTA bilaterally Abdomen:  Soft, nontender and nondistended. Normal bowel sounds. Extremities:  Without edema. Neurologic:  Alert and oriented,  grossly normal neurologically. Psych:  Cooperative. Normal mood and affect.   Lab Results: Recent Labs    04/12/18 0548 04/13/18 0545 04/14/18 0602  WBC 11.9* 15.8* 15.7*  HGB 13.4 14.2 14.1  HCT 39.6 41.8 41.1  PLT 391 444* 438*   BMET Recent Labs    04/12/18 0548 04/13/18 0545 04/14/18 0602  NA 139 138 139  K 4.3 4.1 4.1  CL 104 102 102  CO2 27 26 27   GLUCOSE 127* 119* 125*  BUN 12 15 18   CREATININE 0.87 0.95 0.93  CALCIUM 9.0 9.3 9.2   LFT Recent Labs    04/14/18 0602  PROT 7.6  ALBUMIN 3.4*  AST 11*  ALT 23  ALKPHOS 69  BILITOT 0.8

## 2018-04-14 NOTE — Telephone Encounter (Signed)
Patient prefers to infuse at [facility] and not at home.  Referral for new start Entyvio sent to [facility] as well as [facility].

## 2018-04-14 NOTE — Progress Notes (Signed)
Discussed case with Gastroenterology PA and patient is medically stable and has no other active medical issues. We will transfer to Gastroenterology service at this time to Dr. Carlean Purl.  Please call [facility] back if we can be of any assistance in managing this patient.

## 2018-04-14 NOTE — Telephone Encounter (Signed)
-----   Message from Gatha Mayer, MD sent at 04/13/2018  3:45 PM EDT ----- Regarding: cancel colon, entyvio Need to cancel colonoscopy appt  Need to Rx Entyvio in anticipation of starting as outpatient  We can consider at home infusion - will discuss

## 2018-04-14 NOTE — Telephone Encounter (Signed)
Patient has been scheduled for Entyvio appt with his wife for 04/28/18

## 2018-04-15 ENCOUNTER — Inpatient Hospital Stay (HOSPITAL_COMMUNITY): Payer: Managed Care, Other (non HMO)

## 2018-04-15 LAB — HEPATITIS B SURFACE ANTIBODY,QUALITATIVE: Hep B S Ab: NONREACTIVE

## 2018-04-15 LAB — HEPATITIS B SURFACE ANTIGEN: Hepatitis B Surface Ag: NEGATIVE

## 2018-04-15 LAB — HEPATITIS B CORE ANTIBODY, TOTAL: Hep B Core Total Ab: NEGATIVE

## 2018-04-15 MED ORDER — HYDROMORPHONE HCL 1 MG/ML IJ SOLN
1.0000 mg | INTRAMUSCULAR | Status: DC | PRN
  Administered 2018-04-15 – 2018-04-19 (×8): 1 mg via INTRAVENOUS
  Filled 2018-04-15 (×8): qty 1

## 2018-04-15 MED ORDER — DIPHENOXYLATE-ATROPINE 2.5-0.025 MG PO TABS
2.0000 | ORAL_TABLET | Freq: Four times a day (QID) | ORAL | Status: DC
  Administered 2018-04-15 – 2018-04-20 (×17): 2 via ORAL
  Filled 2018-04-15 (×18): qty 2

## 2018-04-15 NOTE — Progress Notes (Signed)
Pharmacist-Provider Communication:  John [name] is restricted to outpatient use only.   Thanks!  Lenis Noon, PharmD, BCPS 04/15/18 5:22 PM

## 2018-04-15 NOTE — Progress Notes (Addendum)
    Progress Note   Subjective  Chief Complaint: Ulcerative colitis flare  This morning patient reports he was feeling just fine until he woke up and took a few bites of food and then rushed to the bathroom to have at least 2-3 loose bowel movements with some pain and then started coughing and vomited "quite a large amount of liquid".  Yesterday patient admits that he did not have much of an appetite and was unable to truly eat.  Only reports 8 loose stools yesterday.    Objective   Vital signs in last 24 hours: Temp:  [98.4 F (36.9 C)-98.7 F (37.1 C)] 98.7 F (37.1 C) (06/26 0622) Pulse Rate:  [66-85] 85 (06/26 0622) Resp:  [18] 18 (06/25 2143) BP: (121-125)/(71-74) 124/74 (06/26 0622) SpO2:  [93 %-95 %] 94 % (06/26 0622) Last BM Date: 04/14/18 General:    white male in NAD Heart:  Regular rate and rhythm; no murmurs Lungs: Respirations even and unlabored, lungs CTA bilaterally Abdomen:  Soft, nontender and nondistended. Normal bowel sounds. Extremities:  Without edema. Neurologic:  Alert and oriented,  grossly normal neurologically. Psych:  Cooperative. Normal mood and affect.  Intake/Output from previous day: 06/25 0701 - 06/26 0700 In: 1408.3 [P.O.:240; I.V.:1168.3] Out: -   Lab Results: Recent Labs    04/13/18 0545 04/14/18 0602  WBC 15.8* 15.7*  HGB 14.2 14.1  HCT 41.8 41.1  PLT 444* 438*   BMET Recent Labs    04/13/18 0545 04/14/18 0602  NA 138 139  K 4.1 4.1  CL 102 102  CO2 26 27  GLUCOSE 119* 125*  BUN 15 18  CREATININE 0.95 0.93  CALCIUM 9.3 9.2   LFT Recent Labs    04/14/18 0602  PROT 7.6  ALBUMIN 3.4*  AST 11*  ALT 23  ALKPHOS 69  BILITOT 0.8    Assessment / Plan:   Assessment: 1.  Ulcerative colitis flare: On Humira as outpatient, currently still on IV Solu-Medrol 30 mg every 12 hours, colonoscopy 04/11/2018-biopsies showing chronic moderately active colitis with focal erosion, plan to switch patient to [facility] as  outpatient  Plan: 1.  Continue IV steroids 2.  Continue Lomotil schedule IV 4 times daily 3.  Continue Ativan p.o. 1 mg 4.  Continue soft diet 5.  Consider imaging given description of vomiting after eating, possibly small bowel follow-through? 6.  Please await further recommendations from Dr. Carlean Purl later today   LOS: 5 days   Lavone Nian [facility]  04/15/2018, 10:34 AM  Pager # [phone]    [facility] Attending   I have taken an interval history, reviewed the chart and examined the patient. I agree with the Advanced Practitioner's note, impression and recommendations.   He has been ok since AM but frustrated.  Mom and dad and wife in room when I saw him.  Await CMV stains on path  2 view abd looks ok to me - some thick walls to colon but no sig dilation  Stop lorazepam  Increase Lomotil to 2 qid  Add 1 mg Dilaudid prn  Reassess tomorrow  Ask pharmacy if we can do Entyvio inpatient - do not think so  Gatha Mayer, MD, [facility] 04/15/2018 4:53 PM

## 2018-04-16 LAB — CBC
HCT: 42.8 % (ref 39.0–52.0)
Hemoglobin: 14.4 g/dL (ref 13.0–17.0)
MCH: 30.3 pg (ref 26.0–34.0)
MCHC: 33.6 g/dL (ref 30.0–36.0)
MCV: 89.9 fL (ref 78.0–100.0)
Platelets: 440 10*3/uL — ABNORMAL HIGH (ref 150–400)
RBC: 4.76 MIL/uL (ref 4.22–5.81)
RDW: 13.4 % (ref 11.5–15.5)
WBC: 15.2 10*3/uL — ABNORMAL HIGH (ref 4.0–10.5)

## 2018-04-16 LAB — C-REACTIVE PROTEIN: CRP: 19.1 mg/dL — ABNORMAL HIGH (ref <1.0)

## 2018-04-16 MED ORDER — ADALIMUMAB 40 MG/0.4ML ~~LOC~~ PNKT
80.0000 mg | PEN_INJECTOR | Freq: Once | SUBCUTANEOUS | Status: AC
Start: 2018-04-16 — End: 2018-04-16
  Administered 2018-04-16: 80 mg via SUBCUTANEOUS

## 2018-04-16 NOTE — Progress Notes (Addendum)
    Progress Note   Assessment / Plan:   Assessment: 1.  Ulcerative colitis flare: On Humira as outpatient, currently still on IV Solu-Medrol 30 mg every 12, colonoscopy 04/11/2018-biopsy showed chronic moderately active colitis with focal erosion, CMV stain negative, 2 view abdominal x-ray 04/15/2018 with some thick walls of the colon but no significant dilation, patient continues with multiple bowel movements though decreased abdominal pain and no vomiting this morning  Plan: 1.  Discussed with patient that Entyvio can only be done as outpatient, this is being arranged for a week from now  2.  Continue current medications 3.  We will have patient bring in his Humira and take 80 mg today-order placed 4.  We will likely proceed with unprepped flex sig tomorrow for evaluation of disease state 5.  Please await final recommendations of Dr. Yoland Scherr later today   LOS: 6 days   Jennifer Lynne Lemmon  04/16/2018, 10:33 AM  Pager # [phone]  Humira 80 mg today Flex sig tomorrow Further plans after that  Drury Ardizzone E. Johnthomas Lader, MD, FACG [facility] 04/16/2018 3:34 PM    Subjective  Chief Complaint: Ulcerative colitis flare  No vomiting this morning, though has had quite a few loose stools per him.  "Not as much blood or pain".   Objective   Vital signs in last 24 hours: Temp:  [98 F (36.7 C)-98.9 F (37.2 C)] 98 F (36.7 C) (06/27 0534) Pulse Rate:  [70-77] 70 (06/27 0534) Resp:  [18] 18 (06/27 0534) BP: (123-143)/(77-81) 123/79 (06/27 0534) SpO2:  [95 %-97 %] 95 % (06/27 0534) Last BM Date: 04/16/18 General:    white male in NAD Heart:  Regular rate and rhythm; no murmurs Lungs: Respirations even and unlabored, lungs CTA bilaterally Abdomen:  Soft, nontender and nondistended. Normal bowel sounds. Extremities:  Without edema. Neurologic:  Alert and oriented,  grossly normal neurologically. Psych:  Cooperative. Normal mood and affect.   Lab Results: Recent Labs    04/14/18 0602 04/16/18 0529  WBC 15.7* 15.2*  HGB 14.1 14.4  HCT 41.1 42.8  PLT 438* 440*     Studies/Results: Dg Abd 2 Views  Result Date: 04/15/2018 CLINICAL DATA:  Nausea, vomiting and diarrhea. EXAM: ABDOMEN - 2 VIEW COMPARISON:  CT abdomen pelvis 07/24/2015 FINDINGS: The bowel gas pattern is normal. There is no evidence of free air. No radio-opaque calculi or other significant radiographic abnormality is seen. IMPRESSION: Negative. Electronically Signed   By: Kevin  Herman M.D.   On: 04/15/2018 17:17       

## 2018-04-16 NOTE — H&P (View-Only) (Signed)
    Progress Note   Assessment / Plan:   Assessment: 1.  Ulcerative colitis flare: On Humira as outpatient, currently still on IV Solu-Medrol 30 mg every 12, colonoscopy 04/11/2018-biopsy showed chronic moderately active colitis with focal erosion, CMV stain negative, 2 view abdominal x-ray 04/15/2018 with some thick walls of the colon but no significant dilation, patient continues with multiple bowel movements though decreased abdominal pain and no vomiting this morning  Plan: 1.  Discussed with patient that [facility] can only be done as outpatient, this is being arranged for a week from now  2.  Continue current medications 3.  We will have patient bring in his Humira and take 80 mg today-order placed 4.  We will likely proceed with unprepped flex sig tomorrow for evaluation of disease state 5.  Please await final recommendations of Dr. Carlean Purl later today   LOS: 6 days   Levin Erp  04/16/2018, 10:33 AM  Pager # [phone]  Humira 80 mg today Flex sig tomorrow Further plans after that  Gatha Mayer, MD, [facility] 04/16/2018 3:34 PM    Subjective  Chief Complaint: Ulcerative colitis flare  No vomiting this morning, though has had quite a few loose stools per him.  "Not as much blood or pain".   Objective   Vital signs in last 24 hours: Temp:  [98 F (36.7 C)-98.9 F (37.2 C)] 98 F (36.7 C) (06/27 0534) Pulse Rate:  [70-77] 70 (06/27 0534) Resp:  [18] 18 (06/27 0534) BP: (123-143)/(77-81) 123/79 (06/27 0534) SpO2:  [95 %-97 %] 95 % (06/27 0534) Last BM Date: 04/16/18 General:    white male in NAD Heart:  Regular rate and rhythm; no murmurs Lungs: Respirations even and unlabored, lungs CTA bilaterally Abdomen:  Soft, nontender and nondistended. Normal bowel sounds. Extremities:  Without edema. Neurologic:  Alert and oriented,  grossly normal neurologically. Psych:  Cooperative. Normal mood and affect.   Lab Results: Recent Labs    04/14/18 0602 04/16/18 0529  WBC 15.7* 15.2*  HGB 14.1 14.4  HCT 41.1 42.8  PLT 438* 440*     Studies/Results: Dg Abd 2 Views  Result Date: 04/15/2018 CLINICAL DATA:  Nausea, vomiting and diarrhea. EXAM: ABDOMEN - 2 VIEW COMPARISON:  CT abdomen pelvis 07/24/2015 FINDINGS: The bowel gas pattern is normal. There is no evidence of free air. No radio-opaque calculi or other significant radiographic abnormality is seen. IMPRESSION: Negative. Electronically Signed   By: Ulyses Jarred M.D.   On: 04/15/2018 17:17

## 2018-04-16 NOTE — Progress Notes (Signed)
Patient wife is going to bring Humira today.

## 2018-04-17 ENCOUNTER — Encounter (HOSPITAL_COMMUNITY)
Admission: EM | Disposition: A | Discharge status: Home / Self Care | Payer: Self-pay | Source: Home / Self Care | Attending: Internal Medicine

## 2018-04-17 ENCOUNTER — Encounter (HOSPITAL_COMMUNITY): Payer: Self-pay | Admitting: *Deleted

## 2018-04-17 HISTORY — PX: FLEXIBLE SIGMOIDOSCOPY: SHX5431

## 2018-04-17 LAB — C-REACTIVE PROTEIN: CRP: 17 mg/dL — ABNORMAL HIGH (ref <1.0)

## 2018-04-17 SURGERY — SIGMOIDOSCOPY, FLEXIBLE
Anesthesia: Moderate Sedation

## 2018-04-17 MED ORDER — FENTANYL CITRATE (PF) 100 MCG/2ML IJ SOLN
INTRAMUSCULAR | Status: DC | PRN
  Administered 2018-04-17 (×2): 25 ug via INTRAVENOUS

## 2018-04-17 MED ORDER — SODIUM CHLORIDE 0.9 % IV SOLN
INTRAVENOUS | Status: DC
  Administered 2018-04-17 (×2): via INTRAVENOUS

## 2018-04-17 MED ORDER — MIDAZOLAM HCL 10 MG/2ML IJ SOLN
INTRAMUSCULAR | Status: DC | PRN
  Administered 2018-04-17: 1 mg via INTRAVENOUS

## 2018-04-17 MED ORDER — DIPHENHYDRAMINE HCL 50 MG/ML IJ SOLN
INTRAMUSCULAR | Status: DC | PRN
  Administered 2018-04-17: 25 mg via INTRAVENOUS

## 2018-04-17 MED ORDER — MIDAZOLAM HCL 5 MG/ML IJ SOLN
INTRAMUSCULAR | Status: AC
  Filled 2018-04-17: qty 2

## 2018-04-17 MED ORDER — FENTANYL CITRATE (PF) 100 MCG/2ML IJ SOLN
INTRAMUSCULAR | Status: AC
  Filled 2018-04-17: qty 2

## 2018-04-17 MED ORDER — MIDAZOLAM HCL 10 MG/2ML IJ SOLN
INTRAMUSCULAR | Status: DC | PRN
  Administered 2018-04-17 (×2): 2 mg via INTRAVENOUS

## 2018-04-17 MED ORDER — HYDROCORTISONE 100 MG/60ML RE ENEM
100.0000 mg | ENEMA | Freq: Two times a day (BID) | RECTAL | Status: DC
  Administered 2018-04-17 – 2018-04-19 (×5): 100 mg via RECTAL
  Filled 2018-04-17 (×6): qty 1

## 2018-04-17 MED ORDER — METHYLPREDNISOLONE SODIUM SUCC 40 MG IJ SOLR
40.0000 mg | Freq: Three times a day (TID) | INTRAMUSCULAR | Status: DC
  Administered 2018-04-17 – 2018-04-20 (×8): 40 mg via INTRAVENOUS
  Filled 2018-04-17 (×8): qty 1

## 2018-04-17 MED ORDER — DIPHENHYDRAMINE HCL 50 MG/ML IJ SOLN
INTRAMUSCULAR | Status: AC
  Filled 2018-04-17: qty 1

## 2018-04-17 NOTE — Op Note (Signed)
[facility] Patient Name: John [name] Procedure Date: 04/17/2018 MRN: [id number] Attending MD: Gatha Mayer , MD Date of Birth: April 09, 1981 CSN: [id number] Age: 37 Admit Type: Outpatient Procedure:                Flexible Sigmoidoscopy Indications:              Chronic ulcerative pancolitis, Follow-up of chronic                            ulcerative pancolitis, Disease activity assessment                            of chronic ulcerative pancolitis Providers:                Gatha Mayer, MD, Elmer Ramp. Hinson, RN, Cletis Athens, Technician Referring MD:              Medicines:                Fentanyl 50 micrograms IV, Midazolam 5 mg IV,                            Diphenhydramine 25 mg IV Complications:            No immediate complications. Estimated Blood Loss:     Estimated blood loss was minimal. Procedure:                Pre-Anesthesia Assessment:                           - Prior to the procedure, a History and Physical                            was performed, and patient medications and                            allergies were reviewed. The patient's tolerance of                            previous anesthesia was also reviewed. The risks                            and benefits of the procedure and the sedation                            options and risks were discussed with the patient.                            All questions were answered, and informed consent                            was obtained. Prior Anticoagulants: The patient  last took Lovenox (enoxaparin) on the day of the                            procedure. ASA Grade Assessment: II - A patient                            with mild systemic disease. After reviewing the                            risks and benefits, the patient was deemed in                            satisfactory condition to undergo the procedure.                           After  obtaining informed consent, the scope was                            passed under direct vision. The EC-[id number] ([id number])                            scope was introduced through the anus and advanced                            to the the sigmoid colon. The flexible                            sigmoidoscopy was accomplished without difficulty.                            The patient tolerated the procedure well. The                            quality of the bowel preparation was adequate. Scope In: Scope Out: Findings:      The perianal and digital rectal examinations were normal.      A continuous area of bleeding ulcerated mucosa with stigmata of recent       bleeding was present in the rectum and in the sigmoid colon. Impression:               - Mucosal ulceration. UC - severe                           - No specimens collected. Moderate Sedation:      Moderate (conscious) sedation was administered by the endoscopy nurse       and supervised by the endoscopist. The following parameters were       monitored: oxygen saturation, heart rate, blood pressure, respiratory       rate, EKG, adequacy of pulmonary ventilation, and response to care. Recommendation:           - This looks same/somewhat worse compared to the                            colonoscopy photos from last week  [name] went 19-17                           He says maybe a bit better today re: cramps                           So overall similar                           Try cortenema bid                           change solumedrol to 40 mg q 8                           recheck CRP in 2 d                           Aiming for Entyvio when he can leave hospital has                            had reaction to remicade and failed Humira Procedure Code(s):        --- Professional ---                           [id number], Sigmoidoscopy, flexible; diagnostic,                            including collection of  specimen(s) by brushing or                            washing, when performed (separate procedure) Diagnosis Code(s):        --- Professional ---                           K63.3, Ulcer of intestine                           K51.00, Ulcerative (chronic) pancolitis without                            complications CPT copyright 2017 American Medical Association. All rights reserved. The codes documented in this report are preliminary and upon coder review may  be revised to meet current compliance requirements. Gatha Mayer, MD 04/17/2018 2:02:07 PM This report has been signed electronically. Number of Addenda: 0

## 2018-04-17 NOTE — Interval H&P Note (Signed)
History and Physical Interval Note:  04/17/2018 1:29 PM  John [name]  has presented today for surgery, with the diagnosis of Ulcerative Colitis  The various methods of treatment have been discussed with the patient and family. After consideration of risks, benefits and other options for treatment, the patient has consented to  Procedure(s): FLEXIBLE SIGMOIDOSCOPY (N/A) as a surgical intervention .  The patient's history has been reviewed, patient examined, no change in status, stable for surgery.  I have reviewed the patient's chart and labs.  Questions were answered to the patient's satisfaction.     Silvano Rusk

## 2018-04-18 ENCOUNTER — Encounter (HOSPITAL_COMMUNITY): Payer: Self-pay | Admitting: Internal Medicine

## 2018-04-18 DIAGNOSIS — K625 Hemorrhage of anus and rectum: Secondary | ICD-10-CM

## 2018-04-18 LAB — THIOPURINE METHYLTRANSFERASE (TPMT), RBC: TPMT Activity:: 20 Units/mL RBC

## 2018-04-18 MED ORDER — MERCAPTOPURINE 50 MG PO TABS
50.0000 mg | ORAL_TABLET | Freq: Every day | ORAL | Status: DC
  Administered 2018-04-18 – 2018-04-20 (×3): 50 mg via ORAL
  Filled 2018-04-18 (×3): qty 1

## 2018-04-18 NOTE — Plan of Care (Signed)
  Problem: Pain Managment: Goal: General experience of comfort will improve Outcome: Progressing   

## 2018-04-18 NOTE — Progress Notes (Signed)
   Patient Name: John [name] Date of Encounter: 04/18/2018, 3:50 PM    Subjective  Better today 3 stools today Retaining enemas Cramps not as intense and no Dilaudid   Objective  BP 122/75 (BP Location: Right Arm)   Pulse 69   Temp 98.3 F (36.8 C) (Oral)   Resp 18   Ht 5\' 5"  (1.651 m)   Wt 220 lb (99.8 kg)   SpO2 95%   BMI 36.61 kg/m  NAD Lungs cta Cor NL abd soft, quiet and NT    Assessment and Plan  UC flare w/ bloody diarrhea and cramps  Flex sig yesterday simialr, sl worse in rectum  Better w/ addition of cortenema Maybe home 1-2 d and getting plugged in for Entyvio infusion Tues (consult then infusion ? After)  TPMT start 6 MP 50/day Gatha Mayer, MD, [facility] 04/18/2018 3:50 PM

## 2018-04-19 DIAGNOSIS — R112 Nausea with vomiting, unspecified: Secondary | ICD-10-CM

## 2018-04-19 LAB — C-REACTIVE PROTEIN: CRP: 7.8 mg/dL — ABNORMAL HIGH (ref <1.0)

## 2018-04-19 MED ORDER — TRAZODONE HCL 50 MG PO TABS: 50.0000 mg | ORAL_TABLET | Freq: Every day | ORAL | 2 refills | Status: DC

## 2018-04-19 MED ORDER — HYDROCORTISONE 100 MG/60ML RE ENEM: 1.0000 | ENEMA | Freq: Every day | RECTAL | 1 refills | Status: DC

## 2018-04-19 MED ORDER — PREDNISONE 10 MG PO TABS: 60.0000 mg | ORAL_TABLET | Freq: Every day | ORAL | 1 refills | Status: DC

## 2018-04-19 MED ORDER — MERCAPTOPURINE 50 MG PO TABS: 50.0000 mg | ORAL_TABLET | Freq: Every day | ORAL | 5 refills | Status: DC

## 2018-04-19 MED ORDER — TRAZODONE HCL 50 MG PO TABS
50.0000 mg | ORAL_TABLET | Freq: Every day | ORAL | Status: DC
  Administered 2018-04-19: 50 mg via ORAL
  Filled 2018-04-19: qty 1

## 2018-04-19 NOTE — Discharge Summary (Addendum)
     Discharge Summary   Subjective  Chief Complaint: Ulcerative Colitis Flare  Pt much better today, back to 3-4 BM per morning and limited cramping. Plan to start [facility] July 2nd. Continue Prednisone 56m qd for now and Entecort enemas qhs.    Objective   Vital signs in last 24 hours: Temp:  [97.7 F (36.5 C)-99.3 F (37.4 C)] 97.7 F (36.5 C) (06/30 0449) Pulse Rate:  [69-72] 72 (06/30 0449) Resp:  [18] 18 (06/30 0449) BP: (128-130)/(64-83) 130/83 (06/30 0449) SpO2:  [92 %-95 %] 95 % (06/30 0449) Last BM Date: 04/18/18 General:    white male in NAD Heart:  Regular rate and rhythm; no murmurs Lungs: Respirations even and unlabored, lungs CTA bilaterally Abdomen:  Soft, nontender and nondistended. Normal bowel sounds. Extremities:  Without edema. Neurologic:  Alert and oriented,  grossly normal neurologically. Psych:  Cooperative. Normal mood and affect.    Assessment / Plan:   Assessment: 1.  Ulcerative colitis flare: With bloody diarrhea and cramps, colonoscopy and flex sig during stay, patient now doing some better, [facility] Plan: 1. Patient will continue Prednisone 654mdaily for now.  Our office will contact him when he should decrease. 2.  Plans for Entyvio start July 2 3.  Prescribed Trazodone 50 mg nightly for sleep 4.  Continue Lomotil 5.  Continue Cortenemas nightly 6. Continue Mercaptopurine (6MP) daily 7.  Patient will be contacted with follow-up appointment recommendations by our office.   LOS: 9 days   JeLevin Erp6/30/2019, 10:02 AM  Pager # [phone]   [facility] Attending   I have taken an interval history, reviewed the chart and examined the patient. I agree with the Advanced Practitioner's note, impression and recommendations.   He felt worse again with diarrhea and cramps and nausea/dry heaves this PM so have postponed dc  Maybe tomorrow  CaGatha MayerMD, FAJordanastroenterology 04/19/2018 1:10  PM

## 2018-04-20 LAB — CREATININE, SERUM
Creatinine, Ser: 0.67 mg/dL (ref 0.61–1.24)
GFR calc Af Amer: >60 mL/min (ref >60)
GFR calc non Af Amer: >60 mL/min (ref >60)

## 2018-04-20 MED ORDER — HYDROCODONE-ACETAMINOPHEN 5-325 MG PO TABS: 1.0000 | ORAL_TABLET | ORAL | 0 refills | Status: DC | PRN

## 2018-04-20 NOTE — Progress Notes (Signed)
NL Started on 6 MP 50 mg

## 2018-04-20 NOTE — Discharge Summary (Signed)
[facility] Discharge Summary  Name: John [name] MRN: [id number] DOB: [birth year] 37 y.o. PCP:  Wendie Agreste, MD  Date of Admission: 04/10/2018  9:19 AM Date of Discharge: 04/20/2018 Attending Physician: Gatha Mayer, MD  Discharge Diagnosis: Ulcerative colitis  Consultations: Hospitalist originally admitted patient then we accepted transfer of care  GI Procedures:   Flexible sigmoidoscopy 04/17/2018-continuous area of bleeding ulcerated mucosa in the rectum and sigmoid.   Flexible sigmoidoscopy 04/11/2018  -severe left-sided colitis from anus to transverse colon. Biopsies c/w moderately active UC.  CMV stain negative.   History/Physical Exam:  See Admission H&P  Admission HPI:  John [name] is a 37 y.o. male with medical history significant of ulcerative colitis diagnosed in 2016 currently on Humira who presents with bloody diarrhea over the past week.  Also admits to left lower quadrant cramping with diarrhea.  He had a fever this morning at home as well as chills.  He admits to nausea without vomiting.  No other chest pain, cough, shortness of breath.  He was started on prednisone as an outpatient by Dr. Carlean Purl, however patient has been having continued bloody diarrhea, fever, nausea despite outpatient treatment.  He was referred to be evaluated in the emergency department.  [name] Course: Labs obtained which revealed WBC 11.7, hemoglobin 13.5, creatinine 1.02.  Fecal occult blood was positive.  Stool PCR sent.  Hospital Course by problem list:  Ulcerative Colitis     John [name] 04/10/2018 with LLQl quadrant pain, fever, and bloody diarrhea.  Outpatient C. difficile study was negative.  He was failing outpatient therapy with Humira and recent addition of prednisone.  On admission his hemoglobin was 11.7, hemoglobin 13.  He was started on Solu-Medrol and prepped for a.m. Colonoscopy.  Due to severe inflammation this was converted to a flexible  sigmoidoscopy with findings of severe left-sided colitis.  Over the following few days he continued to have nausea and loose stools.  He had difficulty advancing to solid food.  Loose stools continued, patient was scheduled for repeat flexible sigmoidoscopy which again showed severe left-sided colitis with ulceration and bleeding.  He was started on Cort enemas and finally began showing noticeable improvement.  On the day of discharge the bleeding had ceased, stool frequency had improved and abdominal pain resolved.  He was tolerating a soft diet.  He was also started on 54mp this admission. Humira stopped,  Plan is to start him on Entyvio.  He has an appointment set up for this tomorrow.     Discharge Vitals:  BP 120/69   Pulse 64   Temp 97.9 F (36.6 C) (Oral)   Resp 18   Ht 5\' 5"  (1.651 m)   Wt 220 lb (99.8 kg)   SpO2 95%   BMI 36.61 kg/m   Discharge Labs:  Results for orders placed or performed during the hospital encounter of 04/10/18 (from the past 24 hour(s))  Creatinine, serum     Status: None   Collection Time: 04/20/18  6:41 AM  Result Value Ref Range   Creatinine, Ser 0.67 0.61 - 1.24 mg/dL   GFR calc non Af Amer >60 >60 mL/min   GFR calc Af Amer >60 >60 mL/min    Disposition and follow-up:   John [name] was discharged from [facility] in stable condition.    Follow-up Appointments:  05/21/18 at 8:30am with Tye Savoy, NP   Discharge Medications: Allergies as of 04/20/2018  Reactions   Amoxicillin Hives   Hives to trunk and arms Has patient had a PCN reaction causing immediate rash, facial/tongue/throat swelling, SOB or lightheadedness with hypotension: Yes Has patient had a PCN reaction causing severe rash involving mucus membranes or skin necrosis: No Has patient had a PCN reaction that required hospitalization: No Has patient had a PCN reaction occurring within the last 10 years: Yes If all of the above answers are "NO", then may proceed with  Cephalosporin use.   Remicade [infliximab] Anaphylaxis   Lialda [mesalamine] Other (See Comments)   Worsening of rectal bleeding      Medication List    STOP taking these medications   Adalimumab 40 MG/0.4ML Pnkt Commonly known as:  HUMIRA PEN   ibuprofen 200 MG tablet Commonly known as:  ADVIL,MOTRIN     TAKE these medications   dicyclomine 20 MG tablet Commonly known as:  BENTYL Take 1 tablet (20 mg total) by mouth 4 (four) times daily -  before meals and at bedtime. What changed:  how much to take   diphenoxylate-atropine 2.5-0.025 MG tablet Commonly known as:  LOMOTIL Take 1 tablet by mouth 4 (four) times daily as needed for diarrhea or loose stools. What changed:    how much to take  when to take this   HYDROcodone-acetaminophen 5-325 MG tablet Commonly known as:  NORCO/VICODIN Take 1-2 tablets by mouth every 4 (four) hours as needed for moderate pain.   hydrocortisone 100 MG/60ML enema Commonly known as:  CORTENEMA Place 1 enema (100 mg total) rectally at bedtime for 15 days.   loratadine 10 MG tablet Commonly known as:  CLARITIN Take 10 mg by mouth daily as needed for allergies.   mercaptopurine 50 MG tablet Commonly known as:  PURINETHOL Take 1 tablet (50 mg total) by mouth daily. Give on an empty stomach 1 hour before or 2 hours after meals. Caution: Chemotherapy.   MULTIVITAMIN PO Take 1 tablet by mouth daily.   predniSONE 10 MG tablet Commonly known as:  DELTASONE Take 6 tablets (60 mg total) by mouth daily with breakfast. Continue 60mg  (6 tabs qd) PO by mouth until told otherwise. What changed:    how much to take  additional instructions   ranitidine 150 MG tablet Commonly known as:  ZANTAC Take 1 tablet (150 mg total) by mouth daily as needed for heartburn.   traZODone 50 MG tablet Commonly known as:  DESYREL Take 1 tablet (50 mg total) by mouth at bedtime.   Vitamin D 2000 units Caps Take 1 capsule by mouth daily.        Signed: Tye Savoy 04/20/2018, 11:22 AM

## 2018-04-21 ENCOUNTER — Encounter (HOSPITAL_COMMUNITY): Payer: Self-pay | Admitting: Anesthesiology

## 2018-04-29 ENCOUNTER — Telehealth: Payer: Self-pay

## 2018-04-29 ENCOUNTER — Encounter: Payer: Managed Care, Other (non HMO) | Admitting: Internal Medicine

## 2018-04-29 NOTE — Telephone Encounter (Signed)
Rec'd request from [place] for documentation of care from 04-10-18  to process Short Term Disability Claim.  Faxed hospital notes/procedure notes from 04-10-18 to 04-20-18 to fax: [phone]. [employer] [id number].  Policy #:[id number]

## 2018-05-01 ENCOUNTER — Other Ambulatory Visit (INDEPENDENT_AMBULATORY_CARE_PROVIDER_SITE_OTHER): Payer: Managed Care, Other (non HMO)

## 2018-05-01 ENCOUNTER — Other Ambulatory Visit: Payer: Self-pay

## 2018-05-01 DIAGNOSIS — R7989 Other specified abnormal findings of blood chemistry: Secondary | ICD-10-CM

## 2018-05-01 DIAGNOSIS — K51011 Ulcerative (chronic) pancolitis with rectal bleeding: Secondary | ICD-10-CM

## 2018-05-01 DIAGNOSIS — Z7960 Long term (current) use of unspecified immunomodulators and immunosuppressants: Secondary | ICD-10-CM

## 2018-05-01 DIAGNOSIS — R945 Abnormal results of liver function studies: Secondary | ICD-10-CM

## 2018-05-01 DIAGNOSIS — Z79899 Other long term (current) drug therapy: Secondary | ICD-10-CM

## 2018-05-01 LAB — HEPATIC FUNCTION PANEL
ALT: 65 U/L — ABNORMAL HIGH (ref 0–53)
AST: 13 U/L (ref 0–37)
Albumin: 3.2 g/dL — ABNORMAL LOW (ref 3.5–5.2)
Alkaline Phosphatase: 77 U/L (ref 39–117)
Bilirubin, Direct: 0 mg/dL (ref 0.0–0.3)
Total Bilirubin: 0.3 mg/dL (ref 0.2–1.2)
Total Protein: 6.5 g/dL (ref 6.0–8.3)

## 2018-05-01 LAB — C-REACTIVE PROTEIN: CRP: 2.8 mg/dL (ref 0.5–20.0)

## 2018-05-04 NOTE — Progress Notes (Signed)
Lab results provided to spouse Please book him in my open banding slot 7/19 for f/u

## 2018-05-05 LAB — ADALIMUMAB+AB (SERIAL MONITOR)
Adalimumab Drug Level: 0.9 ug/mL
Anti-Adalimumab Antibody: 101 ng/mL

## 2018-05-05 LAB — SERIAL MONITORING

## 2018-05-06 ENCOUNTER — Other Ambulatory Visit: Payer: Self-pay | Admitting: Internal Medicine

## 2018-05-08 ENCOUNTER — Encounter: Payer: Self-pay | Admitting: Internal Medicine

## 2018-05-08 ENCOUNTER — Ambulatory Visit: Payer: Managed Care, Other (non HMO) | Admitting: Internal Medicine

## 2018-05-08 VITALS — BP 124/78 | HR 106 | Ht 65.0 in | Wt 193.0 lb

## 2018-05-08 DIAGNOSIS — Z7952 Long term (current) use of systemic steroids: Secondary | ICD-10-CM | POA: Diagnosis not present

## 2018-05-08 DIAGNOSIS — Z7960 Long term (current) use of unspecified immunomodulators and immunosuppressants: Secondary | ICD-10-CM

## 2018-05-08 DIAGNOSIS — K51011 Ulcerative (chronic) pancolitis with rectal bleeding: Secondary | ICD-10-CM

## 2018-05-08 DIAGNOSIS — Z79899 Other long term (current) drug therapy: Secondary | ICD-10-CM

## 2018-05-08 MED ORDER — CALCIUM CARBONATE ANTACID 500 MG PO CHEW: 1.0000 | CHEWABLE_TABLET | Freq: Three times a day (TID) | ORAL | Status: DC

## 2018-05-08 NOTE — Progress Notes (Signed)
John [name] 37 y.o. 1981/06/28 [id number]  Assessment & Plan:   Long-term use of immunosuppressant medication - Entyvio DC 6 MP  Ulcerative colitis ([place]) He is improved.  Weyman Rodney has been done he is doing for his week to infusion.  Discontinue 6-MP he feels worse on that.  Continue prednisone he is to notify me in 3 days how he is and we will consider reducing to 30 mg daily. Return in about a month. Return to work August 1 if able.  Long term (current) use of systemic steroids Add 3 500 mg Tums to his regimen with vitamin D   I appreciate the opportunity to care for this patient. CC: Wendie Agreste, MD   Subjective:   Chief Complaint: Follow-up of ulcerative colitis  HPI John is here for follow-up of his ulcerative colitis.  He has had 1 dose of Entyvio.  He is better he has a six-point score on the simple clinical colitis activity index.  He is having scanty blood with loose stools up to 4-6 in a day.  He thinks based upon recent experience that he feels worse when he takes 6-MP.  He had elevated transaminases and stop the 6-MP and felt okay I have him resume that and he felt worse again with nausea and abdominal cramps and would like to stop it again.  He remains on prednisone 40 mg daily.  No longer taking Cort enema and not using dicyclomine.  1 Lomotil 3 times daily.  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 he is nervous about returning to work because of how hot it is in the long days in the garage.  Using Benadryl for sleep.  Trazodone made him feel foggy and too sleepy. Allergies  Allergen Reactions  . Amoxicillin Hives    Hives to trunk and arms Has patient had a PCN reaction causing immediate rash, facial/tongue/throat swelling, SOB or lightheadedness with hypotension: Yes Has patient had a PCN reaction causing severe rash involving mucus membranes or skin necrosis: No Has patient had a PCN reaction that required hospitalization: No Has patient had a PCN  reaction occurring within the last 10 years: Yes If all of the above answers are "NO", then may proceed with Cephalosporin use.  . Remicade [Infliximab] Anaphylaxis  . Lialda [Mesalamine] Other (See Comments)    Worsening of rectal bleeding   Current Meds  Medication Sig  . Cholecalciferol (VITAMIN D) 2000 units CAPS Take 1 capsule by mouth daily.  .    . diphenoxylate-atropine (LOMOTIL) 2.5-0.025 MG tablet Take 1 tablet by mouth 4 (four) times daily as needed for diarrhea or loose stools.  Marland Kitchen HYDROcodone-acetaminophen (NORCO/VICODIN) 5-325 MG tablet Take 1-2 tablets by mouth every 4 (four) hours as needed for moderate pain.  Marland Kitchen loratadine (CLARITIN) 10 MG tablet Take 10 mg by mouth daily as needed for allergies.   Marland Kitchen mercaptopurine (PURINETHOL) 50 MG tablet Take 1 tablet (50 mg total) by mouth daily. Give on an empty stomach 1 hour before or 2 hours after meals. Caution: Chemotherapy.  . Multiple Vitamins-Minerals (MULTIVITAMIN PO) Take 1 tablet by mouth daily.   . predniSONE (DELTASONE) 10 MG tablet Take 40 mg by mouth daily with breakfast.   . ranitidine (ZANTAC) 150 MG tablet Take 1 tablet (150 mg total) by mouth daily as needed for heartburn.  .    . vedolizumab (ENTYVIO) 300 MG injection Inject into the vein. Every eight weeks after his next injection   Past Medical  History:  Diagnosis Date  . Acute anal fissure 10/27/2015  . Esophageal reflux 06/08/2014  . GERD (gastroesophageal reflux disease)   . Hemorrhoids, internal, with bleeding 06/08/2014  . Perirectal abscess 07/31/2015  . 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 UC (ulcerative colitis) ([place])   . Vitamin D deficiency 04/03/2016   Past Surgical History:  Procedure Laterality Date  . BIOPSY  04/11/2018   Procedure: BIOPSY;  Surgeon: Ladene Artist, MD;  Location: [facility];  Service: Endoscopy;;  . COLONOSCOPY N/A 04/11/2018   Procedure: COLONOSCOPY;  Surgeon: Ladene Artist,  MD;  Location: [facility];  Service: Endoscopy;  Laterality: N/A;  . ESOPHAGOGASTRODUODENOSCOPY N/A 07/11/2015   Procedure: ESOPHAGOGASTRODUODENOSCOPY (EGD);  Surgeon: Jerene Bears, MD;  Location: [facility];  Service: Gastroenterology;  Laterality: N/A;  . FLEXIBLE SIGMOIDOSCOPY N/A 07/11/2015   Procedure: FLEXIBLE SIGMOIDOSCOPY;  Surgeon: Jerene Bears, MD;  Location: [facility];  Service: Gastroenterology;  Laterality: N/A;  . FLEXIBLE SIGMOIDOSCOPY N/A 04/17/2018   Procedure: FLEXIBLE SIGMOIDOSCOPY;  Surgeon: Gatha Mayer, MD;  Location: [facility];  Service: Endoscopy;  Laterality: N/A;  . INCISION AND DRAINAGE PERIRECTAL ABSCESS N/A 07/24/2015   Procedure: IRRIGATION AND DEBRIDEMENT PERIRECTAL ABSCESS;  Surgeon: Alphonsa Overall, MD;  Location: [facility];  Service: General;  Laterality: N/A;  . KNEE ARTHROSCOPY Left   . TYMPANOSTOMY TUBE PLACEMENT Bilateral    Social History   Social History Narrative   Married (Dottie Speegle CMA [employer]), 1 daughter born 2014 1 son born 2016   [employer]   Enjoys softball   family history includes Colon cancer in his paternal grandfather; Colon cancer (age of onset: 26) in his father; Diabetes in his father; Heart disease in his brother, father, maternal grandfather, maternal grandmother, mother, paternal grandfather, and paternal grandmother; Hyperlipidemia in his father and mother; Mental illness in his maternal grandfather.   Review of Systems As per HPI  Objective:   Physical Exam BP 124/78   Pulse (!) 106   Ht 5\' 5"  (1.651 m)   Wt 193 lb (87.5 kg)   SpO2 96%   BMI 32.12 kg/m  @BP  124/78   Pulse (!) 106   Ht 5\' 5"  (1.651 m)   Wt 193 lb (87.5 kg)   SpO2 96%   BMI 32.12 kg/m @  General:  NAD Eyes:   anicteric Lungs:  clear Heart::  S1S2 no rubs, murmurs or gallops Abdomen:  soft and nontender, BS+ Ext:   no edema, cyanosis or clubbing    Data Reviewed:  As per HPI

## 2018-05-08 NOTE — Assessment & Plan Note (Signed)
He is improved.  John [name] has been done he is doing for his week to infusion.  Discontinue 6-MP he feels worse on that.  Continue prednisone he is to notify me in 3 days how he is and we will consider reducing to 30 mg daily. Return in about a month. Return to work August 1 if able.

## 2018-05-08 NOTE — Patient Instructions (Signed)
  Please take Tums 500mg  tablets , one three times a day.   Normal BMI (Body Mass Index- based on height and weight) is between 19 and 25. Your BMI today is Body mass index is 32.12 kg/m. Marland Kitchen Please consider follow up  regarding your BMI with your Primary Care Provider.   Follow up with [place] in month.    I appreciate the opportunity to care for you. Silvano Rusk, MD, [facility]

## 2018-05-08 NOTE — Assessment & Plan Note (Signed)
Add 3 500 mg Tums to his regimen with vitamin D

## 2018-05-08 NOTE — Assessment & Plan Note (Signed)
DC 6 MP

## 2018-05-14 ENCOUNTER — Encounter: Payer: Self-pay | Admitting: Internal Medicine

## 2018-05-21 ENCOUNTER — Ambulatory Visit: Payer: Managed Care, Other (non HMO) | Admitting: Nurse Practitioner

## 2018-06-11 ENCOUNTER — Encounter: Payer: Self-pay | Admitting: Internal Medicine

## 2018-06-11 ENCOUNTER — Ambulatory Visit: Payer: Managed Care, Other (non HMO) | Admitting: Internal Medicine

## 2018-06-11 ENCOUNTER — Other Ambulatory Visit (INDEPENDENT_AMBULATORY_CARE_PROVIDER_SITE_OTHER): Payer: Managed Care, Other (non HMO)

## 2018-06-11 VITALS — Ht 65.0 in | Wt 199.4 lb

## 2018-06-11 DIAGNOSIS — K5100 Ulcerative (chronic) pancolitis without complications: Secondary | ICD-10-CM | POA: Diagnosis not present

## 2018-06-11 DIAGNOSIS — R748 Abnormal levels of other serum enzymes: Secondary | ICD-10-CM

## 2018-06-11 DIAGNOSIS — Z79899 Other long term (current) drug therapy: Secondary | ICD-10-CM | POA: Diagnosis not present

## 2018-06-11 DIAGNOSIS — Z7960 Long term (current) use of unspecified immunomodulators and immunosuppressants: Secondary | ICD-10-CM

## 2018-06-11 LAB — HEPATIC FUNCTION PANEL
ALT: 13 U/L (ref 0–53)
AST: 12 U/L (ref 0–37)
Albumin: 4.2 g/dL (ref 3.5–5.2)
Alkaline Phosphatase: 51 U/L (ref 39–117)
Bilirubin, Direct: 0.1 mg/dL (ref 0.0–0.3)
Total Bilirubin: 0.4 mg/dL (ref 0.2–1.2)
Total Protein: 7.5 g/dL (ref 6.0–8.3)

## 2018-06-11 NOTE — Assessment & Plan Note (Addendum)
Stable doing well.

## 2018-06-11 NOTE — Assessment & Plan Note (Signed)
Recheck LFTs today.  Could have been medication or disease related.  I am not suspecting significant liver disease but needs follow-up.

## 2018-06-11 NOTE — Assessment & Plan Note (Addendum)
Improved overall doing much better than earlier this year.  Will reduce prednisone to 5 mg daily for 2 weeks then 5 mg every other day for 2 weeks and then stop.  Continue Entyvio every 8 weeks.  Return to see me in 3 to 4 months.  Sooner as needed.

## 2018-06-11 NOTE — Patient Instructions (Signed)
Your provider has requested that you go to the basement level for lab work before leaving today. Press "B" on the elevator. The lab is located at the first door on the left as you exit the elevator.   Adjust your prednisone: Take 5 mg daily for 2 weeks, then 5 mg every over day for 2 weeks then stop.   I appreciate the opportunity to care for you. Silvano Rusk, MD, [facility]

## 2018-06-11 NOTE — Progress Notes (Signed)
John [name] 37 y.o. Apr 25, 1981 [id number]  Assessment & Plan:   Ulcerative colitis ([place]) Improved overall doing much better than earlier this year.  Will reduce prednisone to 5 mg daily for 2 weeks then 5 mg every other day for 2 weeks and then stop.  Continue Entyvio every 8 weeks.  Return to see me in 3 to 4 months.  Sooner as needed.  Long-term use of immunosuppressant medication - Entyvio Stable doing well.  Abnormal transaminases Recheck LFTs today.  Could have been medication or disease related.  I am not suspecting significant liver disease but needs follow-up.  I appreciate the opportunity to care for this patient. CC: Wendie Agreste, MD    Subjective:   Chief Complaint:.  Follow-up of ulcerative pancolitis  HPI John reports that he is doing much better with on average about 2 bowel movements a day but sometimes he has an urge to defecate and is only gas.  Stools are not liquid or runny but they are not quite solid yet either.  No bleeding.  He is been on 10 mg of prednisone daily for a few weeks.  Work is going well.  Sometimes when he gets up he is a little woozy and lightheaded.  He uses about 1 Lomotil a day Allergies  Allergen Reactions  . Amoxicillin Hives    Hives to trunk and arms Has patient had a PCN reaction causing immediate rash, facial/tongue/throat swelling, SOB or lightheadedness with hypotension: Yes Has patient had a PCN reaction causing severe rash involving mucus membranes or skin necrosis: No Has patient had a PCN reaction that required hospitalization: No Has patient had a PCN reaction occurring within the last 10 years: Yes If all of the above answers are "NO", then may proceed with Cephalosporin use.  . Remicade [Infliximab] Anaphylaxis  . Lialda [Mesalamine] Other (See Comments)    Worsening of rectal bleeding   Current Meds  Medication Sig  . calcium carbonate (TUMS) 500 MG chewable tablet Chew 1 tablet (200 mg of elemental calcium  total) by mouth 3 (three) times daily.  . Cholecalciferol (VITAMIN D) 2000 units CAPS Take 1 capsule by mouth daily.  . diphenoxylate-atropine (LOMOTIL) 2.5-0.025 MG tablet Take 1 tablet by mouth 4 (four) times daily as needed for diarrhea or loose stools.  Marland Kitchen loratadine (CLARITIN) 10 MG tablet Take 10 mg by mouth daily as needed for allergies.   . Multiple Vitamins-Minerals (MULTIVITAMIN PO) Take 1 tablet by mouth daily.   . predniSONE (DELTASONE) 10 MG tablet Take 5 mg by mouth. Take 5 mg daily for 2 weeks, then 5mg  every other day for 2 weeks then stop.  . ranitidine (ZANTAC) 150 MG tablet Take 1 tablet (150 mg total) by mouth daily as needed for heartburn.  . vedolizumab (ENTYVIO) 300 MG injection Inject into the vein. Every eight weeks after his next injection   Past Medical History:  Diagnosis Date  . Acute anal fissure 10/27/2015  . Esophageal reflux 06/08/2014  . GERD (gastroesophageal reflux disease)   . Hemorrhoids, internal, with bleeding 06/08/2014  . Perirectal abscess 07/31/2015  . 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 UC (ulcerative colitis) ([place])   . Vitamin D deficiency 04/03/2016   Past Surgical History:  Procedure Laterality Date  . BIOPSY  04/11/2018   Procedure: BIOPSY;  Surgeon: Ladene Artist, MD;  Location: [facility];  Service: Endoscopy;;  . COLONOSCOPY N/A 04/11/2018  Procedure: COLONOSCOPY;  Surgeon: Ladene Artist, MD;  Location: [facility];  Service: Endoscopy;  Laterality: N/A;  . ESOPHAGOGASTRODUODENOSCOPY N/A 07/11/2015   Procedure: ESOPHAGOGASTRODUODENOSCOPY (EGD);  Surgeon: Jerene Bears, MD;  Location: [facility];  Service: Gastroenterology;  Laterality: N/A;  . FLEXIBLE SIGMOIDOSCOPY N/A 07/11/2015   Procedure: FLEXIBLE SIGMOIDOSCOPY;  Surgeon: Jerene Bears, MD;  Location: [facility];  Service: Gastroenterology;  Laterality: N/A;  . FLEXIBLE SIGMOIDOSCOPY N/A 04/17/2018   Procedure: FLEXIBLE  SIGMOIDOSCOPY;  Surgeon: Gatha Mayer, MD;  Location: [facility];  Service: Endoscopy;  Laterality: N/A;  . INCISION AND DRAINAGE PERIRECTAL ABSCESS N/A 07/24/2015   Procedure: IRRIGATION AND DEBRIDEMENT PERIRECTAL ABSCESS;  Surgeon: Alphonsa Overall, MD;  Location: [facility];  Service: General;  Laterality: N/A;  . KNEE ARTHROSCOPY Left   . TYMPANOSTOMY TUBE PLACEMENT Bilateral    Social History   Social History Narrative   Married (Dottie Sobalvarro CMA [employer]), 1 daughter born 2014 1 son born 2016   [employer]   Enjoys softball   family history includes Colon cancer in his paternal grandfather; Colon cancer (age of onset: 22) in his father; Diabetes in his father; Heart disease in his brother, father, maternal grandfather, maternal grandmother, mother, paternal grandfather, and paternal grandmother; Hyperlipidemia in his father and mother; Mental illness in his maternal grandfather.   Review of Systems   Objective:   Physical Exam @Ht  5\' 5"  (1.651 m)   Wt 199 lb 6 oz (90.4 kg)   BMI 33.18 kg/m @ Supine sitting and standing blood pressure about the same at 120/70 with pulse 85 all positions. General:  NAD Eyes:   anicteric Lungs:  clear Heart::  S1S2 no rubs, murmurs or gallops Abdomen:  soft and nontender, BS+ Ext:   no edema, cyanosis or clubbing    Data Reviewed:  As per HPI

## 2018-06-12 NOTE — Progress Notes (Signed)
LFT's NL  My Chart

## 2018-08-12 ENCOUNTER — Telehealth: Payer: Self-pay | Admitting: Internal Medicine

## 2018-08-12 DIAGNOSIS — R600 Localized edema: Secondary | ICD-10-CM

## 2018-08-12 NOTE — Telephone Encounter (Signed)
New onset bilateral edema - pitting  No SOB, no CP  No fever, redness  Plan  CBC, CMET UA

## 2018-08-13 ENCOUNTER — Encounter: Payer: Self-pay | Admitting: Physician Assistant

## 2018-08-13 ENCOUNTER — Other Ambulatory Visit: Payer: Managed Care, Other (non HMO)

## 2018-08-13 ENCOUNTER — Other Ambulatory Visit (INDEPENDENT_AMBULATORY_CARE_PROVIDER_SITE_OTHER): Payer: Managed Care, Other (non HMO)

## 2018-08-13 ENCOUNTER — Ambulatory Visit: Payer: Managed Care, Other (non HMO) | Admitting: Physician Assistant

## 2018-08-13 VITALS — BP 124/73 | HR 79 | Temp 98.4°F | Resp 16 | Ht 65.0 in | Wt 213.4 lb

## 2018-08-13 DIAGNOSIS — R609 Edema, unspecified: Secondary | ICD-10-CM | POA: Diagnosis not present

## 2018-08-13 DIAGNOSIS — R600 Localized edema: Secondary | ICD-10-CM | POA: Diagnosis not present

## 2018-08-13 DIAGNOSIS — D649 Anemia, unspecified: Secondary | ICD-10-CM | POA: Diagnosis not present

## 2018-08-13 LAB — URINALYSIS, ROUTINE W REFLEX MICROSCOPIC
Bilirubin Urine: NEGATIVE
Hgb urine dipstick: NEGATIVE
Ketones, ur: NEGATIVE
Leukocytes, UA: NEGATIVE
Nitrite: NEGATIVE
Specific Gravity, Urine: <=1.005 — AB (ref 1.000–1.030)
Total Protein, Urine: NEGATIVE
Urine Glucose: NEGATIVE
Urobilinogen, UA: 0.2 (ref 0.0–1.0)
WBC, UA: NONE SEEN — AB (ref 0–?)
pH: 6 (ref 5.0–8.0)

## 2018-08-13 LAB — COMPREHENSIVE METABOLIC PANEL
ALT: 11 U/L (ref 0–53)
AST: 16 U/L (ref 0–37)
Albumin: 4.2 g/dL (ref 3.5–5.2)
Alkaline Phosphatase: 46 U/L (ref 39–117)
BUN: 10 mg/dL (ref 6–23)
CO2: 26 mEq/L (ref 19–32)
Calcium: 9.1 mg/dL (ref 8.4–10.5)
Chloride: 104 mEq/L (ref 96–112)
Creatinine, Ser: 0.92 mg/dL (ref 0.40–1.50)
GFR: 97.12 mL/min (ref >60.00)
Glucose, Bld: 101 mg/dL — ABNORMAL HIGH (ref 70–99)
Potassium: 3.9 mEq/L (ref 3.5–5.1)
Sodium: 138 mEq/L (ref 135–145)
Total Bilirubin: 0.5 mg/dL (ref 0.2–1.2)
Total Protein: 7.5 g/dL (ref 6.0–8.3)

## 2018-08-13 LAB — CBC
HCT: 27.6 % — ABNORMAL LOW (ref 39.0–52.0)
Hemoglobin: 8.7 g/dL — ABNORMAL LOW (ref 13.0–17.0)
MCHC: 31.5 g/dL (ref 30.0–36.0)
MCV: 70.8 fl — ABNORMAL LOW (ref 78.0–100.0)
Platelets: 438 10*3/uL — ABNORMAL HIGH (ref 150.0–400.0)
RBC: 3.9 Mil/uL — ABNORMAL LOW (ref 4.22–5.81)
RDW: 16.8 % — ABNORMAL HIGH (ref 11.5–15.5)
WBC: 8.8 10*3/uL (ref 4.0–10.5)

## 2018-08-13 NOTE — Patient Instructions (Addendum)
Follow up with GI as planned.   If you have lab work done today you will be contacted with your lab results within the next 2 weeks.  If you have not heard from [place] then please contact us. The fastest way to get your results is to register for My Chart.   How to Use Compression Stockings Compression stockings are elastic socks that squeeze the legs. They help to increase blood flow to the legs, decrease swelling in the legs, and reduce the chance of developing blood clots in the lower legs. Compression stockings are often used by people who:  Are recovering from surgery.  Have poor circulation in their legs.  Are prone to getting blood clots in their legs.  Have varicose veins.  Sit or stay in bed for long periods of time.  How to use compression stockings Before you put on your compression stockings:  Make sure that they are the correct size. If you do not know your size, ask your health care provider.  Make sure that they are clean, dry, and in good condition.  Check them for rips and tears. Do not put them on if they are ripped or torn.  Put your stockings on first thing in the morning, before you get out of bed. Keep them on for as long as your health care provider advises. When you are wearing your stockings:  Keep them as smooth as possible. Do not allow them to bunch up. It is especially important to prevent the stockings from bunching up around your toes or behind your knees.  Do not roll the stockings downward and leave them rolled down. This can decrease blood flow to your leg.  Change them right away if they become wet or dirty.  When you take off your stockings, inspect your legs and feet. Anything that does not seem normal may require medical attention. Look for:  Open sores.  Red spots.  Swelling.  Information and tips  Do not stop wearing your compression stockings without talking to your health care provider first.  Wash your stockings every day with  mild detergent in cold or warm water. Do not use bleach. Air-dry your stockings or dry them in a clothes dryer on low heat.  Replace your stockings every 3-6 months.  If skin moisturizing is part of your treatment plan, apply lotion or cream at night so that your skin will be dry when you put on the stockings in the morning. It is harder to put the stockings on when you have lotion on your legs or feet. Contact a health care provider if: Remove your stockings and seek medical care if:  You have a feeling of pins and needles in your feet or legs.  You have any new changes in your skin.  You have skin lesions that are getting worse.  You have swelling or pain that is getting worse.  Get help right away if:  You have numbness or tingling in your lower legs that does not get better right after you take the stockings off.  Your toes or feet become cold and blue.  You develop open sores or red spots on your legs that do not go away.  You see or feel a warm spot on your leg.  You have new swelling or soreness in your leg.  You are short of breath or you have chest pain for no reason.  You have a rapid or irregular heartbeat.  You feel light-headed or dizzy. This  information is not intended to replace advice given to you by your health care provider. Make sure you discuss any questions you have with your health care provider. Document Released: 08/04/2009 Document Revised: 03/06/2016 Document Reviewed: 09/14/2014 Elsevier Interactive Patient Education  2018 [employer].  Edema Edema is when you have too much fluid in your body or under your skin. Edema may make your legs, feet, and ankles swell up. Swelling is also common in looser tissues, like around your eyes. This is a common condition. It gets more common as you get older. There are many possible causes of edema. Eating too much salt (sodium) and being on your feet or sitting for a long time can cause edema in your legs, feet,  and ankles. Hot weather may make edema worse. Edema is usually painless. Your skin may look swollen or shiny. Follow these instructions at home:  Keep the swollen body part raised (elevated) above the level of your heart when you are sitting or lying down.  Do not sit still or stand for a long time.  Do not wear tight clothes. Do not wear garters on your upper legs.  Exercise your legs. This can help the swelling go down.  Wear elastic bandages or support stockings as told by your doctor.  Eat a low-salt (low-sodium) diet to reduce fluid as told by your doctor.  Depending on the cause of your swelling, you may need to limit how much fluid you drink (fluid restriction).  Take over-the-counter and prescription medicines only as told by your doctor. Contact a doctor if:  Treatment is not working.  You have heart, liver, or kidney disease and have symptoms of edema.  You have sudden and unexplained weight gain. Get help right away if:  You have shortness of breath or chest pain.  You cannot breathe when you lie down.  You have pain, redness, or warmth in the swollen areas.  You have heart, liver, or kidney disease and get edema all of a sudden.  You have a fever and your symptoms get worse all of a sudden. Summary  Edema is when you have too much fluid in your body or under your skin.  Edema may make your legs, feet, and ankles swell up. Swelling is also common in looser tissues, like around your eyes.  Raise (elevate) the swollen body part above the level of your heart when you are sitting or lying down.  Follow your doctor's instructions about diet and how much fluid you can drink (fluid restriction). This information is not intended to replace advice given to you by your health care provider. Make sure you discuss any questions you have with your health care provider. Document Released: 03/25/2008 Document Revised: 10/25/2016 Document Reviewed: 10/25/2016 Elsevier  Interactive Patient Education  2017 [employer].  IF you received an x-ray today, you will receive an invoice from [facility]. Please contact [facility] at [phone] with questions or concerns regarding your invoice.   IF you received labwork today, you will receive an invoice from [facility]. Please contact [facility] at [phone] with questions or concerns regarding your invoice.   Our billing staff will not be able to assist you with questions regarding bills from these companies.  You will be contacted with the lab results as soon as they are available. The fastest way to get your results is to activate your My Chart account. Instructions are located on the last page of this paperwork. If you have not heard from [place] regarding  the results in 2 weeks, please contact this office.

## 2018-08-13 NOTE — Progress Notes (Signed)
Labs are ok  Have him see his PCP about the edema please

## 2018-08-13 NOTE — Progress Notes (Signed)
John [name]  MRN: [id number] DOB: 11-17-1980  Subjective:  John [name] is a 37 y.o. male with PMH of UC seen in office today for a chief complaint of BLE swelling x 2 days. Has actually noticed it for about a year but it has seemed worse the past couple days. It gets worse throughout the day. Not there in the morning. Stands on feet all day at work.   Denies associated redness, pain, warmth, SOB, chest pain, heart palpations, DOE, orthopnea, and paroxysmal nocturnal dyspnea. No recent surgery/travel/immobilization, hx of cancer, hemoptysis, prior DVT/PE, or hormone use.  No new medications. Denies smoking. Drinks at least 64 oz a day. Eats fried foods mostly.   Followed closely by GI for UC. Just had labs today, was told they were all normal.   Review of Systems  Constitutional: Positive for fatigue (always has fatigue, nothing new). Negative for chills, diaphoresis, fever and unexpected weight change.  Gastrointestinal: Negative for abdominal pain, blood in stool, nausea and vomiting.  Neurological: Negative for dizziness and light-headedness.    Patient Active Problem List   Diagnosis Date Noted  . Abnormal transaminases 06/11/2018  . Long term (current) use of systemic steroids 05/08/2018  . Bilateral carpal tunnel syndrome 12/11/2017  . Vitamin D deficiency 04/03/2016  . Long-term use of immunosuppressant medication - Entyvio 07/31/2015  . Ulcerative colitis ([place])   . Hypothyroidism 06/29/2015    Current Outpatient Medications on File Prior to Visit  Medication Sig Dispense Refill  . calcium carbonate (TUMS) 500 MG chewable tablet Chew 1 tablet (200 mg of elemental calcium total) by mouth 3 (three) times daily. 90 tablet   . Cholecalciferol (VITAMIN D) 2000 units CAPS Take 1 capsule by mouth daily.    . diphenoxylate-atropine (LOMOTIL) 2.5-0.025 MG tablet Take 1 tablet by mouth 4 (four) times daily as needed for diarrhea or loose stools. 60 tablet 2  . loratadine (CLARITIN)  10 MG tablet Take 10 mg by mouth daily as needed for allergies.     . Multiple Vitamins-Minerals (MULTIVITAMIN PO) Take 1 tablet by mouth daily.     . ranitidine (ZANTAC) 150 MG tablet Take 1 tablet (150 mg total) by mouth daily as needed for heartburn.    . vedolizumab (ENTYVIO) 300 MG injection Inject into the vein. Every eight weeks after his next injection     No current facility-administered medications on file prior to visit.     Allergies  Allergen Reactions  . Amoxicillin Hives    Hives to trunk and arms Has patient had a PCN reaction causing immediate rash, facial/tongue/throat swelling, SOB or lightheadedness with hypotension: Yes Has patient had a PCN reaction causing severe rash involving mucus membranes or skin necrosis: No Has patient had a PCN reaction that required hospitalization: No Has patient had a PCN reaction occurring within the last 10 years: Yes If all of the above answers are "NO", then may proceed with Cephalosporin use.  . Remicade [Infliximab] Anaphylaxis  . Lialda [Mesalamine] Other (See Comments)    Worsening of rectal bleeding   Social History   Socioeconomic History  . Marital status: Married    Spouse name: Not on file  . Number of children: 2  . Years of education: Not on file  . Highest education level: Not on file  Occupational History  . Occupation: Designer, industrial/product: [employer]  . Financial resource strain: Not on file  . Food insecurity:  Worry: Not on file    Inability: Not on file  . Transportation needs:    Medical: Not on file    Non-medical: Not on file  Tobacco Use  . Smoking status: Never Smoker  . Smokeless tobacco: Never Used  Substance and Sexual Activity  . Alcohol use: No    Alcohol/week: 0.0 standard drinks  . Drug use: No  . Sexual activity: Yes  Lifestyle  . Physical activity:    Days per week: Not on file    Minutes per session: Not on file  . Stress: Not on file    Relationships  . Social connections:    Talks on phone: Not on file    Gets together: Not on file    Attends religious service: Not on file    Active member of club or organization: Not on file    Attends meetings of clubs or organizations: Not on file    Relationship status: Not on file  . Intimate partner violence:    Fear of current or ex partner: Not on file    Emotionally abused: Not on file    Physically abused: Not on file    Forced sexual activity: Not on file  Other Topics Concern  . Not on file  Social History Narrative   Married Mohamad Bruso CMA [facility]), 1 daughter born 2014 1 son born 2016   [place]   Enjoys softball   Family History  Problem Relation Age of Onset  . Diabetes Father   . Colon cancer Father 23  . Heart disease Father   . Hyperlipidemia Father   . Heart disease Mother   . Hyperlipidemia Mother   . Heart disease Brother   . Heart disease Maternal Grandmother   . Heart disease Maternal Grandfather   . Mental illness Maternal Grandfather   . Heart disease Paternal Grandmother   . Heart disease Paternal Grandfather   . Colon cancer Paternal Grandfather       Objective:  BP 124/73   Pulse 79   Temp 98.4 F (36.9 C) (Oral)   Resp 16   Ht 5\' 5"  (1.651 m)   Wt 213 lb 6.4 oz (96.8 kg)   SpO2 97%   BMI 35.51 kg/m   Physical Exam  Constitutional: He is oriented to person, place, and time. He appears well-developed and well-nourished. No distress.  HENT:  Head: Normocephalic and atraumatic.  Eyes: Conjunctivae are normal.  Neck: Normal range of motion. No JVD present. No thyromegaly present.  Cardiovascular: Normal rate, regular rhythm, normal heart sounds and intact distal pulses.  Pulses:      Dorsalis pedis pulses are 2+ on the right side, and 2+ on the left side.       Posterior tibial pulses are 2+ on the right side, and 2+ on the left side.  Pulmonary/Chest: Effort normal and breath sounds normal. No accessory  muscle usage. No tachypnea. He has no decreased breath sounds. He has no wheezes. He has no rhonchi. He has no rales.  Musculoskeletal:       Right lower leg: He exhibits edema (2+ pitting edema to knee). He exhibits no tenderness.       Left lower leg: He exhibits edema (2+ pitting edema to knee). He exhibits no tenderness.  No erythema or warmth of BLE  Calves are equal in measurements b/l   Neurological: He is alert and oriented to person, place, and time.  Skin: Skin is warm and  dry.  Psychiatric: He has a normal mood and affect.  Vitals reviewed.   Assessment and Plan :  1. Dependent edema Pt is overally well appearing, distress. 2+ pitting edema to knees. Hx and physical findings consistent with dependent edema. No concern for DVT or CHF at this time as pt's only complaint is b/l symmetric edema. No rales, JVD,, or tachypnea noted. No erythema or warmth. Well criteria score for DVT is -2. Reviewed labs obtained by GI today, which included CMP, UA, and CBC. Electrolytes normal. No proteinuria. Interestingly, his hemoglobin is 8.7, which is a drop from 14.4 three months ago. I personally contacted GI and spoke with Dr. Carlean Purl. He made note of the large drop and states that he has additional tubes of pt's blood and can add on additional labs to further evaluate anemia, which could be responsible for edema. Will also add TSH for further evaluation of edema. Pt denies melena and hematochezia at this time. Suggested pt use compression stockings, elevate legs, and f/u with GI. Given strict ED precautions.  2. Anemia, unspecified type See above.   A total of 25 minutes was spent in the room with the patient, greater than 50% of which was in counseling/coordination of care regarding above.  Tenna Delaine PA-C  [facility] 08/13/2018 3:46 PM

## 2018-08-14 LAB — TSH: TSH: 2.94 u[IU]/mL (ref 0.35–4.50)

## 2018-08-14 LAB — FERRITIN: Ferritin: 6.3 ng/mL — ABNORMAL LOW (ref 22.0–322.0)

## 2018-08-14 LAB — VITAMIN B12: Vitamin B-12: 378 pg/mL (ref 211–911)

## 2018-08-14 NOTE — Progress Notes (Signed)
TSH NL B12 NL Ferritin low Start ferrous sulfate 325 mg bid Consider feraheme

## 2018-08-21 ENCOUNTER — Other Ambulatory Visit: Payer: Self-pay

## 2018-08-21 ENCOUNTER — Telehealth: Payer: Self-pay

## 2018-08-21 DIAGNOSIS — K5100 Ulcerative (chronic) pancolitis without complications: Secondary | ICD-10-CM

## 2018-08-21 NOTE — Telephone Encounter (Signed)
Patient needs Feraheme x 2 for iron def anemia.  He is scheduled for 08/25/18 8:00, 09/01/18 9:00.  Patient and his wife notified

## 2018-08-25 ENCOUNTER — Ambulatory Visit (HOSPITAL_COMMUNITY)
Admission: RE | Admit: 2018-08-25 | Discharge: 2018-08-25 | Disposition: A | Discharge status: Home / Self Care | Payer: Managed Care, Other (non HMO) | Source: Ambulatory Visit | Attending: Internal Medicine | Admitting: Internal Medicine

## 2018-08-25 DIAGNOSIS — D509 Iron deficiency anemia, unspecified: Secondary | ICD-10-CM | POA: Insufficient documentation

## 2018-08-25 DIAGNOSIS — K5100 Ulcerative (chronic) pancolitis without complications: Secondary | ICD-10-CM | POA: Diagnosis present

## 2018-08-25 MED ORDER — SODIUM CHLORIDE 0.9 % IV SOLN
INTRAVENOUS | Status: DC | PRN
  Administered 2018-08-25: 250 mL via INTRAVENOUS

## 2018-08-25 MED ORDER — SODIUM CHLORIDE 0.9 % IV SOLN
510.0000 mg | INTRAVENOUS | Status: DC
  Administered 2018-08-25: 510 mg via INTRAVENOUS
  Filled 2018-08-25: qty 17

## 2018-08-25 NOTE — Discharge Instructions (Signed)

## 2018-08-25 NOTE — Progress Notes (Signed)
PATIENT CARE CENTER NOTE  Diagnosis: Iron Deficiency Anemia    Provider: Dr. Silvano Rusk   Procedure: IV Feraheme   Note: Patient received Feraheme infusion. Monitored for 30 minutes post-infusion. Tolerated infusion well with no adverse reaction. Vital signs stable. Discharge instructions given. Patient to come back next week for second Feraheme infusion. Patient alert, oriented and ambulatory at discharge.

## 2018-09-01 ENCOUNTER — Ambulatory Visit (HOSPITAL_COMMUNITY)
Admission: RE | Admit: 2018-09-01 | Discharge: 2018-09-01 | Disposition: A | Discharge status: Home / Self Care | Payer: Managed Care, Other (non HMO) | Source: Ambulatory Visit | Attending: Internal Medicine | Admitting: Internal Medicine

## 2018-09-01 DIAGNOSIS — K5100 Ulcerative (chronic) pancolitis without complications: Secondary | ICD-10-CM | POA: Diagnosis not present

## 2018-09-01 MED ORDER — SODIUM CHLORIDE 0.9 % IV SOLN
510.0000 mg | Freq: Once | INTRAVENOUS | Status: AC
  Administered 2018-09-01: 510 mg via INTRAVENOUS
  Filled 2018-09-01: qty 17

## 2018-09-01 MED ORDER — SODIUM CHLORIDE 0.9 % IV SOLN
INTRAVENOUS | Status: DC | PRN
  Administered 2018-09-01: 250 mL via INTRAVENOUS

## 2018-09-01 NOTE — Progress Notes (Signed)
PATIENT CARE CENTER NOTE  Diagnosis: Iron Deficiency Anemia    Provider: Dr. Silvano Rusk   Procedure: IV Feraheme   Note: Patient received Feraheme infusion. Monitored for 30 minutes post-infusion. Tolerated infusion well with no adverse reaction. Vital signs stable. Discharge instructions given. Patient alert, oriented and ambulatory at discharge.

## 2018-09-01 NOTE — Discharge Instructions (Signed)

## 2018-09-21 ENCOUNTER — Other Ambulatory Visit: Payer: Self-pay | Admitting: Internal Medicine

## 2018-09-21 DIAGNOSIS — D500 Iron deficiency anemia secondary to blood loss (chronic): Secondary | ICD-10-CM

## 2018-10-02 ENCOUNTER — Other Ambulatory Visit (INDEPENDENT_AMBULATORY_CARE_PROVIDER_SITE_OTHER): Payer: Managed Care, Other (non HMO)

## 2018-10-02 DIAGNOSIS — D500 Iron deficiency anemia secondary to blood loss (chronic): Secondary | ICD-10-CM | POA: Diagnosis not present

## 2018-10-02 LAB — CBC WITH DIFFERENTIAL/PLATELET
Basophils Absolute: 0 10*3/uL (ref 0.0–0.1)
Basophils Relative: 0.6 % (ref 0.0–3.0)
Eosinophils Absolute: 0.3 10*3/uL (ref 0.0–0.7)
Eosinophils Relative: 3.9 % (ref 0.0–5.0)
HCT: 39.1 % (ref 39.0–52.0)
Hemoglobin: 12.8 g/dL — ABNORMAL LOW (ref 13.0–17.0)
Lymphocytes Relative: 35.6 % (ref 12.0–46.0)
Lymphs Abs: 2.7 10*3/uL (ref 0.7–4.0)
MCHC: 32.8 g/dL (ref 30.0–36.0)
MCV: 78.5 fl (ref 78.0–100.0)
Monocytes Absolute: 0.6 10*3/uL (ref 0.1–1.0)
Monocytes Relative: 7.4 % (ref 3.0–12.0)
Neutro Abs: 4 10*3/uL (ref 1.4–7.7)
Neutrophils Relative %: 52.5 % (ref 43.0–77.0)
Platelets: 322 10*3/uL (ref 150.0–400.0)
RBC: 4.99 Mil/uL (ref 4.22–5.81)
RDW: 25.6 % — ABNORMAL HIGH (ref 11.5–15.5)
WBC: 7.6 10*3/uL (ref 4.0–10.5)

## 2018-10-02 LAB — FERRITIN: Ferritin: 61.4 ng/mL (ref 22.0–322.0)

## 2018-10-03 NOTE — Progress Notes (Signed)
Hgb better and ferritin NL (low) Stay on oral iron Need to recheck the CBC and ferritin in 1 month We need to plan to get him a Feb appt with me when that schedule opens

## 2018-10-05 ENCOUNTER — Other Ambulatory Visit: Payer: Self-pay

## 2018-10-05 DIAGNOSIS — K5100 Ulcerative (chronic) pancolitis without complications: Secondary | ICD-10-CM

## 2018-10-05 DIAGNOSIS — D500 Iron deficiency anemia secondary to blood loss (chronic): Secondary | ICD-10-CM

## 2019-01-13 ENCOUNTER — Telehealth: Payer: Self-pay

## 2019-01-13 NOTE — Telephone Encounter (Signed)
Patient would like to know if he can get a simple note for work indicating that he occasionally has a very low grade temp related to his IBD (never above 100.52F). In the case he needs to help with [employer] transport, he will need this note.

## 2019-01-14 ENCOUNTER — Encounter: Payer: Self-pay | Admitting: Internal Medicine

## 2019-01-14 NOTE — Telephone Encounter (Signed)
Letter has been given to his wife.

## 2019-08-06 ENCOUNTER — Encounter: Payer: Self-pay | Admitting: Internal Medicine

## 2019-08-06 ENCOUNTER — Other Ambulatory Visit: Payer: Self-pay

## 2019-08-06 ENCOUNTER — Ambulatory Visit: Payer: Managed Care, Other (non HMO) | Admitting: Internal Medicine

## 2019-08-06 ENCOUNTER — Other Ambulatory Visit (INDEPENDENT_AMBULATORY_CARE_PROVIDER_SITE_OTHER): Payer: Managed Care, Other (non HMO)

## 2019-08-06 VITALS — BP 150/80 | HR 92 | Temp 98.7°F | Ht 65.0 in | Wt 218.2 lb

## 2019-08-06 DIAGNOSIS — R5383 Other fatigue: Secondary | ICD-10-CM | POA: Diagnosis not present

## 2019-08-06 DIAGNOSIS — Z23 Encounter for immunization: Secondary | ICD-10-CM | POA: Diagnosis not present

## 2019-08-06 DIAGNOSIS — R0683 Snoring: Secondary | ICD-10-CM

## 2019-08-06 DIAGNOSIS — K5100 Ulcerative (chronic) pancolitis without complications: Secondary | ICD-10-CM | POA: Diagnosis not present

## 2019-08-06 DIAGNOSIS — E559 Vitamin D deficiency, unspecified: Secondary | ICD-10-CM

## 2019-08-06 LAB — CBC WITH DIFFERENTIAL/PLATELET
Basophils Absolute: 0 10*3/uL (ref 0.0–0.1)
Basophils Relative: 0.4 % (ref 0.0–3.0)
Eosinophils Absolute: 0.2 10*3/uL (ref 0.0–0.7)
Eosinophils Relative: 2.9 % (ref 0.0–5.0)
HCT: 40.6 % (ref 39.0–52.0)
Hemoglobin: 13.7 g/dL (ref 13.0–17.0)
Lymphocytes Relative: 28.4 % (ref 12.0–46.0)
Lymphs Abs: 2.3 10*3/uL (ref 0.7–4.0)
MCHC: 33.8 g/dL (ref 30.0–36.0)
MCV: 86.3 fl (ref 78.0–100.0)
Monocytes Absolute: 0.6 10*3/uL (ref 0.1–1.0)
Monocytes Relative: 7.4 % (ref 3.0–12.0)
Neutro Abs: 4.8 10*3/uL (ref 1.4–7.7)
Neutrophils Relative %: 60.9 % (ref 43.0–77.0)
Platelets: 328 10*3/uL (ref 150.0–400.0)
RBC: 4.71 Mil/uL (ref 4.22–5.81)
RDW: 13.8 % (ref 11.5–15.5)
WBC: 7.9 10*3/uL (ref 4.0–10.5)

## 2019-08-06 NOTE — Assessment & Plan Note (Signed)
entyvio - continue ?

## 2019-08-06 NOTE — Patient Instructions (Signed)
Your provider has requested that you go to the basement level for lab work before leaving today. Press "B" on the elevator. The lab is located at the first door on the left as you exit the elevator.   You have been given you flu vaccine today for the 2020-2021 flu season.    We will get your lab results from [facility] for Dr Carlean Purl to review.   We are referring you to [facility] for a sleep evaluation.   I appreciate the opportunity to care for you. Silvano Rusk, MD, [facility]

## 2019-08-06 NOTE — Progress Notes (Signed)
John [name] 38 y.o. [birth year] GS:[id number]  Assessment & Plan:   Encounter Diagnoses  Name Primary?  . Ulcerative pancolitis without complication ([place]) Yes  . Fatigue, unspecified type   . Snoring   . Vitamin D deficiency      I am going to check a CBC and a ferritin given his a history of anemia with his pancolitis.  Continue his Entyvio. Keep in mind that the fatigue and aches could be from that though sleep apnea could do it and so could hypothyroidism.  Refer to pulmonary for a sleep evaluation  Review labs done in the past year at [facility] where he gets his Entyvio.    Subjective:   Chief Complaint: Follow-up ulcerative colitis, fatigue  HPI John is here for follow-up he was seen in December 2019.  His ulcerative colitis is under good control on Entyvio.  However he feels like he is fatigued and achy a lot with this medication.  No rashes.  [facility] has done labs sometime within the last few months.  I do not have those results.  He snores a lot.  Is never had a sleep test.  In the past he had hypothyroidism but then things normalized and he came off medication.  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  Work and family life going well. Allergies  Allergen Reactions  . Amoxicillin Hives    Hives to trunk and arms Has patient had a PCN reaction causing immediate rash, facial/tongue/throat swelling, SOB or lightheadedness with hypotension: Yes Has patient had a PCN reaction causing severe rash involving mucus membranes or skin necrosis: No Has patient had a PCN reaction that required hospitalization: No Has patient had a PCN reaction occurring within the last 10 years: Yes If all of the above answers are "NO", then may proceed with Cephalosporin use.  . Remicade [Infliximab] Anaphylaxis  . Lialda [Mesalamine] Other (See Comments)    Worsening of rectal bleeding   Current Meds  Medication Sig   . calcium carbonate (TUMS) 500 MG chewable tablet Chew 1 tablet (200 mg of elemental calcium total) by mouth 3 (three) times daily.  . Cholecalciferol (VITAMIN D) 2000 units CAPS Take 1 capsule by mouth daily.  . ferrous sulfate 325 (65 FE) MG tablet Take 1 tablet by mouth daily.  Marland Kitchen loratadine (CLARITIN) 10 MG tablet Take 10 mg by mouth daily as needed for allergies.   . Multiple Vitamins-Minerals (MULTIVITAMIN PO) Take 1 tablet by mouth daily.   . vedolizumab (ENTYVIO) 300 MG injection Inject into the vein. Every eight weeks after his next injection   Past Medical History:  Diagnosis Date  . Acute anal fissure 10/27/2015  . Esophageal reflux 06/08/2014  . GERD (gastroesophageal reflux disease)   . Hemorrhoids, internal, with bleeding 06/08/2014  . Long-term use of immunosuppressant medication - Entyvio 07/31/2015   QuantiFERON due May 2019, getting Weyman Rodney at [facility]  . Perirectal abscess 07/31/2015  . 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 UC (ulcerative colitis) ([place])   . Vitamin D deficiency 04/03/2016   Past Surgical History:  Procedure Laterality Date  . BIOPSY  04/11/2018   Procedure: BIOPSY;  Surgeon: Ladene Artist, MD;  Location: [facility];  Service: Endoscopy;;  . COLONOSCOPY N/A 04/11/2018   Procedure: COLONOSCOPY;  Surgeon: Ladene Artist, MD;  Location: [facility];  Service:  Endoscopy;  Laterality: N/A;  . ESOPHAGOGASTRODUODENOSCOPY N/A 07/11/2015   Procedure: ESOPHAGOGASTRODUODENOSCOPY (EGD);  Surgeon: Jerene Bears, MD;  Location: [facility];  Service: Gastroenterology;  Laterality: N/A;  . FLEXIBLE SIGMOIDOSCOPY N/A 07/11/2015   Procedure: FLEXIBLE SIGMOIDOSCOPY;  Surgeon: Jerene Bears, MD;  Location: [facility] ENDOSCOPY;  Service: Gastroenterology;  Laterality: N/A;  . FLEXIBLE SIGMOIDOSCOPY N/A 04/17/2018   Procedure: FLEXIBLE SIGMOIDOSCOPY;  Surgeon: Gatha Mayer, MD;  Location: [facility] ENDOSCOPY;   Service: Endoscopy;  Laterality: N/A;  . INCISION AND DRAINAGE PERIRECTAL ABSCESS N/A 07/24/2015   Procedure: IRRIGATION AND DEBRIDEMENT PERIRECTAL ABSCESS;  Surgeon: Alphonsa Overall, MD;  Location: [facility] ORS;  Service: General;  Laterality: N/A;  . KNEE ARTHROSCOPY Left   . TYMPANOSTOMY TUBE PLACEMENT Bilateral    Social History   Social History Narrative   Married (Dottie Thurow CMA [facility]), 1 daughter born 2014 1 son born 2016   [employer]   Enjoys softball   family history includes Colon cancer in his paternal grandfather; Colon cancer (age of onset: 57) in his father; Diabetes in his father; Heart disease in his brother, father, maternal grandfather, maternal grandmother, mother, paternal grandfather, and paternal grandmother; Hyperlipidemia in his father and mother; Mental illness in his maternal grandfather.   Review of Systems See HPI  Objective:   Physical Exam @BP  (!) 150/80 (BP Location: Left Arm, Patient Position: Sitting, Cuff Size: Normal)   Pulse 92   Temp 98.7 F (37.1 C)   Ht 5\' 5"  (1.651 m)   Wt 218 lb 4 oz (99 kg)   BMI 36.32 kg/m @  General:  NAD Head:  Short, thick neck Eyes:   anicteric Lungs:  clear Heart::  S1S2 no rubs, murmurs or gallops Abdomen:  soft and nontender, BS+ Ext:   no edema, cyanosis or clubbing

## 2019-08-06 NOTE — Assessment & Plan Note (Signed)
Recheck.

## 2019-08-09 LAB — T3, FREE: T3, Free: 3.6 pg/mL (ref 2.3–4.2)

## 2019-08-09 LAB — T4, FREE: Free T4: 0.68 ng/dL (ref 0.60–1.60)

## 2019-08-09 LAB — TSH: TSH: 2.03 u[IU]/mL (ref 0.35–4.50)

## 2019-08-09 LAB — FERRITIN: Ferritin: 39.6 ng/mL (ref 22.0–322.0)

## 2019-08-09 LAB — VITAMIN D 25 HYDROXY (VIT D DEFICIENCY, FRACTURES): VITD: 25.52 ng/mL — ABNORMAL LOW (ref 30.00–100.00)

## 2019-08-12 ENCOUNTER — Other Ambulatory Visit: Payer: Self-pay

## 2019-08-12 ENCOUNTER — Ambulatory Visit: Payer: Managed Care, Other (non HMO) | Admitting: Pulmonary Disease

## 2019-08-12 ENCOUNTER — Encounter: Payer: Self-pay | Admitting: Pulmonary Disease

## 2019-08-12 VITALS — BP 134/66 | HR 83 | Ht 65.0 in | Wt 219.2 lb

## 2019-08-12 DIAGNOSIS — R0683 Snoring: Secondary | ICD-10-CM

## 2019-08-12 NOTE — Patient Instructions (Signed)
High likelihood of significant sleep disordered breathing Daytime fatigue  We will set you up for home sleep study Options of treatment as discussed  We will apprise you of results as they become available  I will see you back in the office in about 3 months  Call with significant concerns Sleep Apnea Sleep apnea is a condition in which breathing pauses or becomes shallow during sleep. Episodes of sleep apnea usually last 10 seconds or longer, and they may occur as many as 20 times an hour. Sleep apnea disrupts your sleep and keeps your body from getting the rest that it needs. This condition can increase your risk of certain health problems, including:  Heart attack.  Stroke.  Obesity.  Diabetes.  Heart failure.  Irregular heartbeat. What are the causes? There are three kinds of sleep apnea:  Obstructive sleep apnea. This kind is caused by a blocked or collapsed airway.  Central sleep apnea. This kind happens when the part of the brain that controls breathing does not send the correct signals to the muscles that control breathing.  Mixed sleep apnea. This is a combination of obstructive and central sleep apnea. The most common cause of this condition is a collapsed or blocked airway. An airway can collapse or become blocked if:  Your throat muscles are abnormally relaxed.  Your tongue and tonsils are larger than normal.  You are overweight.  Your airway is smaller than normal. What increases the risk? You are more likely to develop this condition if you:  Are overweight.  Smoke.  Have a smaller than normal airway.  Are elderly.  Are male.  Drink alcohol.  Take sedatives or tranquilizers.  Have a family history of sleep apnea. What are the signs or symptoms? Symptoms of this condition include:  Trouble staying asleep.  Daytime sleepiness and tiredness.  Irritability.  Loud snoring.  Morning headaches.  Trouble concentrating.  Forgetfulness.   Decreased interest in sex.  Unexplained sleepiness.  Mood swings.  Personality changes.  Feelings of depression.  Waking up often during the night to urinate.  Dry mouth.  Sore throat. How is this diagnosed? This condition may be diagnosed with:  A medical history.  A physical exam.  A series of tests that are done while you are sleeping (sleep study). These tests are usually done in a sleep lab, but they may also be done at home. How is this treated? Treatment for this condition aims to restore normal breathing and to ease symptoms during sleep. It may involve managing health issues that can affect breathing, such as high blood pressure or obesity. Treatment may include:  Sleeping on your side.  Using a decongestant if you have nasal congestion.  Avoiding the use of depressants, including alcohol, sedatives, and narcotics.  Losing weight if you are overweight.  Making changes to your diet.  Quitting smoking.  Using a device to open your airway while you sleep, such as: ? An oral appliance. This is a custom-made mouthpiece that shifts your lower jaw forward. ? A continuous positive airway pressure (CPAP) device. This device blows air through a mask when you breathe out (exhale). ? A nasal expiratory positive airway pressure (EPAP) device. This device has valves that you put into each nostril. ? A bi-level positive airway pressure (BPAP) device. This device blows air through a mask when you breathe in (inhale) and breathe out (exhale).  Having surgery if other treatments do not work. During surgery, excess tissue is removed to create a  wider airway. It is important to get treatment for sleep apnea. Without treatment, this condition can lead to:  High blood pressure.  Coronary artery disease.  In men, an inability to achieve or maintain an erection (impotence).  Reduced thinking abilities. Follow these instructions at home: Lifestyle  Make any lifestyle  changes that your health care provider recommends.  Eat a healthy, well-balanced diet.  Take steps to lose weight if you are overweight.  Avoid using depressants, including alcohol, sedatives, and narcotics.  Do not use any products that contain nicotine or tobacco, such as cigarettes, e-cigarettes, and chewing tobacco. If you need help quitting, ask your health care provider. General instructions  Take over-the-counter and prescription medicines only as told by your health care provider.  If you were given a device to open your airway while you sleep, use it only as told by your health care provider.  If you are having surgery, make sure to tell your health care provider you have sleep apnea. You may need to bring your device with you.  Keep all follow-up visits as told by your health care provider. This is important. Contact a health care provider if:  The device that you received to open your airway during sleep is uncomfortable or does not seem to be working.  Your symptoms do not improve.  Your symptoms get worse. Get help right away if:  You develop: ? Chest pain. ? Shortness of breath. ? Discomfort in your back, arms, or stomach.  You have: ? Trouble speaking. ? Weakness on one side of your body. ? Drooping in your face. These symptoms may represent a serious problem that is an emergency. Do not wait to see if the symptoms will go away. Get medical help right away. Call your local emergency services (911 in the [place] Do not drive yourself to the hospital. Summary  Sleep apnea is a condition in which breathing pauses or becomes shallow during sleep.  The most common cause is a collapsed or blocked airway.  The goal of treatment is to restore normal breathing and to ease symptoms during sleep. This information is not intended to replace advice given to you by your health care provider. Make sure you discuss any questions you have with your health care provider.  Document Released: 09/27/2002 Document Revised: 07/24/2018 Document Reviewed: 06/02/2018 Elsevier Patient Education  2020 [employer].

## 2019-08-12 NOTE — Progress Notes (Signed)
Subjective:    Patient ID: John [name], male    DOB: 18-Sep-1981, 38 y.o.   MRN: [id number]  Patient being seen for possible obstructive sleep apnea  History of snoring No witnessed apneas Daytime fatigue  Goes to sleep about 9-10 PM Falls asleep pretty quickly Wakes up between 1 and 3 times during the night Final awakening time about 6 AM Weight has been stable over the last 5 to 6 years  Previously when he was able to lose a significant amount of weight, it was told that he was not snoring as much and his breathing at night was more stable by his spouse  Denies any dryness of his mouth in the mornings, no headaches Rarely has choking episodes-usually when sleeping on a recliner He does have some sweating at night Memory is good No palpitations Dad has obstructive sleep apnea  Past Medical History:  Diagnosis Date  . Acute anal fissure 10/27/2015  . Esophageal reflux 06/08/2014  . GERD (gastroesophageal reflux disease)   . Hemorrhoids, internal, with bleeding 06/08/2014  . Long-term use of immunosuppressant medication - Entyvio 07/31/2015   QuantiFERON due May 2019, getting Weyman Rodney at [facility]  . Perirectal abscess 07/31/2015  . 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 UC (ulcerative colitis) ([place])   . Vitamin D deficiency 04/03/2016   Social History   Socioeconomic History  . Marital status: Married    Spouse name: Not on file  . Number of children: 2  . Years of education: Not on file  . Highest education level: Not on file  Occupational History  . Occupation: Designer, industrial/product: [employer]  . Financial resource strain: Not on file  . Food insecurity    Worry: Not on file    Inability: Not on file  . Transportation needs    Medical: Not on file    Non-medical: Not on file  Tobacco Use  . Smoking status: Never Smoker  . Smokeless tobacco: Never Used  Substance  and Sexual Activity  . Alcohol use: No    Alcohol/week: 0.0 standard drinks  . Drug use: No  . Sexual activity: Yes  Lifestyle  . Physical activity    Days per week: Not on file    Minutes per session: Not on file  . Stress: Not on file  Relationships  . Social Herbalist on phone: Not on file    Gets together: Not on file    Attends religious service: Not on file    Active member of club or organization: Not on file    Attends meetings of clubs or organizations: Not on file    Relationship status: Not on file  . Intimate partner violence    Fear of current or ex partner: Not on file    Emotionally abused: Not on file    Physically abused: Not on file    Forced sexual activity: Not on file  Other Topics Concern  . Not on file  Social History Narrative   Married Tajiri Sekel CMA [facility]), 1 daughter born 2014 1 son born 2016   [place]   Enjoys softball   Family History  Problem Relation Age of Onset  . Diabetes Father   . Colon cancer Father 47  . Heart disease Father   . Hyperlipidemia Father   . Heart disease  Mother   . Hyperlipidemia Mother   . Heart disease Brother   . Heart disease Maternal Grandmother   . Heart disease Maternal Grandfather   . Mental illness Maternal Grandfather   . Heart disease Paternal Grandmother   . Heart disease Paternal Grandfather   . Colon cancer Paternal Grandfather        Review of Systems  Constitutional: Negative for fever and unexpected weight change.  HENT: Negative for congestion, dental problem, ear pain, nosebleeds, postnasal drip, rhinorrhea, sinus pressure, sneezing, sore throat and trouble swallowing.   Eyes: Negative for redness and itching.  Respiratory: Positive for shortness of breath. Negative for cough, chest tightness and wheezing.   Cardiovascular: Negative for palpitations and leg swelling.  Gastrointestinal: Negative for nausea and vomiting.  Genitourinary: Negative for dysuria.   Musculoskeletal: Negative for joint swelling.  Skin: Negative for rash.  Allergic/Immunologic: Positive for environmental allergies. Negative for food allergies and immunocompromised state.  Neurological: Negative for headaches.  Hematological: Does not bruise/bleed easily.  Psychiatric/Behavioral: Negative for dysphoric mood. The patient is not nervous/anxious.       Objective:   Physical Exam Vitals:   08/12/19 1534  BP: 134/66  Pulse: 83  SpO2: 94%    Results of the Epworth flowsheet 08/12/2019  Sitting and reading 0  Watching TV 2  Sitting, inactive in a public place (e.g. a theatre or a meeting) 1  As a passenger in a car for an hour without a break 0  Lying down to rest in the afternoon when circumstances permit 2  Sitting and talking to someone 0  Sitting quietly after a lunch without alcohol 0  In a car, while stopped for a few minutes in traffic 0  Total score 5      Assessment & Plan:  Moderate to high probability of significant obstructive sleep apnea -Snoring, daytime fatigue, obesity, crowded oropharynx, thick neck  Obesity -Significant change in symptoms when he was able to lose weight  Daytime fatigue -Likely related to untreated obstructive sleep apnea  Pathophysiology of sleep disordered breathing discussed with the patient  options of treatment discussed with the patient  Plan:  We will schedule patient for home sleep study  Encouraged to continue working on weight loss efforts  I will see him back in the office in about 3 months Call with significant concerns

## 2019-08-13 NOTE — Progress Notes (Signed)
Labs all ok Let's see what slwwp tests show

## 2019-08-17 NOTE — Progress Notes (Signed)
Also increase vit D to 4k/day

## 2019-09-03 ENCOUNTER — Telehealth: Payer: Self-pay | Admitting: Pulmonary Disease

## 2019-09-03 DIAGNOSIS — Z800 Family history of malignant neoplasm of digestive organs: Secondary | ICD-10-CM | POA: Insufficient documentation

## 2019-09-03 DIAGNOSIS — R5383 Other fatigue: Secondary | ICD-10-CM | POA: Insufficient documentation

## 2019-09-03 DIAGNOSIS — M9112 Juvenile osteochondrosis of head of femur [Legg-Calve-Perthes], left leg: Secondary | ICD-10-CM | POA: Insufficient documentation

## 2019-09-03 DIAGNOSIS — E611 Iron deficiency: Secondary | ICD-10-CM | POA: Insufficient documentation

## 2019-09-03 NOTE — Telephone Encounter (Signed)
I have spoken with Mr. Mitro and he will be picking up the HST machine Tuesday 09/07/2019 @ 4:00pm

## 2019-09-07 ENCOUNTER — Other Ambulatory Visit: Payer: Self-pay

## 2019-09-07 DIAGNOSIS — R0683 Snoring: Secondary | ICD-10-CM

## 2019-09-07 DIAGNOSIS — G4733 Obstructive sleep apnea (adult) (pediatric): Secondary | ICD-10-CM

## 2019-09-08 ENCOUNTER — Ambulatory Visit: Payer: Managed Care, Other (non HMO) | Admitting: Family Medicine

## 2019-09-08 ENCOUNTER — Encounter: Payer: Self-pay | Admitting: Family Medicine

## 2019-09-08 ENCOUNTER — Ambulatory Visit: Payer: Self-pay

## 2019-09-08 VITALS — BP 130/78 | HR 91 | Ht 65.0 in | Wt 216.6 lb

## 2019-09-08 DIAGNOSIS — G5602 Carpal tunnel syndrome, left upper limb: Secondary | ICD-10-CM | POA: Diagnosis not present

## 2019-09-08 DIAGNOSIS — M79642 Pain in left hand: Secondary | ICD-10-CM

## 2019-09-08 DIAGNOSIS — G5603 Carpal tunnel syndrome, bilateral upper limbs: Secondary | ICD-10-CM

## 2019-09-08 DIAGNOSIS — G5601 Carpal tunnel syndrome, right upper limb: Secondary | ICD-10-CM | POA: Diagnosis not present

## 2019-09-08 DIAGNOSIS — M79641 Pain in right hand: Secondary | ICD-10-CM

## 2019-09-08 MED ORDER — GABAPENTIN 300 MG PO CAPS: 600.0000 mg | ORAL_CAPSULE | Freq: Every day | ORAL | 3 refills | Status: DC

## 2019-09-08 NOTE — Progress Notes (Signed)
Patient came into the office today to drop off his home sleep study machine and states that he would be late for work and needed a note explaining this.

## 2019-09-08 NOTE — Progress Notes (Signed)
I, Wendy Poet, LAT, ATC, am serving as scribe for Dr. [name].  John [name] is a 38 y.o. male who presents to [facility] today for f/u of B hand pain.  Pt was last seen by Dr. 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  He also noted N/T in B fingers 1-3.  He was treated w/ B carpal tunnel injections, prescribed gabapentin and instructed to wear wrist support braces at night.  Currently, he notes his B hand pain flared up over the past 1-2 months w/ L worse than R.  He con't to have numbness and tingling in B digits 3 and 4.  He denies any loss in strength.  Pt states that he is no longer taking the Gabapentin and is no longer wearing his braces.  He notes that pain in his hands and medial forearms will wake him up at night.    ROS:  As above  Exam:  BP 130/78 (BP Location: Left Arm, Patient Position: Sitting, Cuff Size: Large)    Pulse 91    Ht 5' 5"  (1.651 m)    Wt 216 lb 9.6 oz (98.2 kg)    SpO2 95%    BMI 36.04 kg/m  Wt Readings from Last 5 Encounters:  09/08/19 216 lb 9.6 oz (98.2 kg)  08/12/19 219 lb 3.2 oz (99.4 kg)  08/06/19 218 lb 4 oz (99 kg)  08/13/18 213 lb 6.4 oz (96.8 kg)  06/11/18 199 lb 6 oz (90.4 kg)   General: Well Developed, well nourished, and in no acute distress.  Neuro/Psych: Alert and oriented x3, extra-ocular muscles intact, able to move all 4 extremities, sensation grossly intact. Skin: Warm and dry, no rashes noted.  Respiratory: Not using accessory muscles, speaking in full sentences, trachea midline.  Cardiovascular: Pulses palpable, no extremity edema. Abdomen: Does not appear distended. MSK:  Left hand normal-appearing normal motion and strength.  Nontender.  Positive Tinel's carpal tunnel.    Lab and Radiology Results Procedure: Real-time Ultrasound Guided median nerve hydrodissection at carpal tunnel left Device: Philips Affiniti 50G Images permanently  stored and available for review in the ultrasound unit. Verbal informed consent obtained.  Discussed risks and benefits of procedure. Warned about infection bleeding damage to structures skin hypopigmentation and fat atrophy among others. Patient expresses understanding and agreement Time-out conducted.   Noted no overlying erythema, induration, or other signs of local infection.   Skin prepped in a sterile fashion.   Local anesthesia: Topical Ethyl chloride.   With sterile technique and under real time ultrasound guidance:  40 mg of Kenalog and 1 mL of lidocaine injected easily.   Completed without difficulty   Pain immediately resolved suggesting accurate placement of the medication.   Advised to call if fevers/chills, erythema, induration, drainage, or persistent bleeding.   Images permanently stored and available for review in the ultrasound unit.  Impression: Technically successful ultrasound guided injection.         Assessment and Plan: 38 y.o. male with carpal tunnel syndrome.  Bilateral left worse than right.  After discussion plan for only left-sided injection.  Plan to resume gabapentin and night splint.  Return as needed for right-sided symptoms if worsening.  Recheck in near future if not improving.  Precautions reviewed.   PDMP not reviewed this encounter. Orders Placed This Encounter  Procedures   NO CHG - [place] UPPER BILATERAL    Order Specific Question:  Reason for Exam (SYMPTOM  OR DIAGNOSIS REQUIRED)    Answer:   B hand pain    Order Specific Question:   Preferred imaging location?    Answer:   [facility] ordered this encounter  Medications   gabapentin (NEURONTIN) 300 MG capsule    Sig: Take 2 capsules (600 mg total) by mouth at bedtime.    Dispense:  180 capsule    Refill:  3    Historical information moved to improve visibility of documentation.  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   Recurrent colitis due to Clostridium difficile    UC (ulcerative colitis) ([place])    Vitamin D deficiency 04/03/2016   Past Surgical History:  Procedure Laterality Date   BIOPSY  04/11/2018   Procedure: BIOPSY;  Surgeon: Ladene Artist, MD;  Location: [facility];  Service: Endoscopy;;   COLONOSCOPY N/A 04/11/2018   Procedure: COLONOSCOPY;  Surgeon: Ladene Artist, MD;  Location: [facility];  Service: Endoscopy;  Laterality: N/A;   ESOPHAGOGASTRODUODENOSCOPY N/A 07/11/2015   Procedure: ESOPHAGOGASTRODUODENOSCOPY (EGD);  Surgeon: Jerene Bears, MD;  Location: [facility];  Service: Gastroenterology;  Laterality: N/A;   FLEXIBLE SIGMOIDOSCOPY N/A 07/11/2015   Procedure: FLEXIBLE SIGMOIDOSCOPY;  Surgeon: Jerene Bears, MD;  Location: [facility];  Service: Gastroenterology;  Laterality: N/A;   FLEXIBLE SIGMOIDOSCOPY N/A 04/17/2018   Procedure: FLEXIBLE SIGMOIDOSCOPY;  Surgeon: Gatha Mayer, MD;  Location: [facility];  Service: Endoscopy;  Laterality: N/A;   INCISION AND DRAINAGE PERIRECTAL ABSCESS N/A 07/24/2015   Procedure: IRRIGATION AND DEBRIDEMENT PERIRECTAL ABSCESS;  Surgeon: Alphonsa Overall, MD;  Location: [facility];  Service: General;  Laterality: N/A;   KNEE ARTHROSCOPY Left    TYMPANOSTOMY TUBE PLACEMENT Bilateral    Social History   Tobacco Use   Smoking status: Never Smoker   Smokeless tobacco: Never Used  Substance Use Topics   Alcohol use: No    Alcohol/week: 0.0 standard drinks   family history includes Colon cancer in his paternal grandfather; Colon cancer (age of onset: 55) in his father; Diabetes in his father; Heart disease in his  brother, father, maternal grandfather, maternal grandmother, mother, paternal grandfather, and paternal grandmother; Hyperlipidemia in his father and mother; Mental illness in his maternal grandfather.  Medications: Current Outpatient Medications  Medication Sig Dispense Refill   calcium carbonate (TUMS) 500 MG chewable tablet Chew 1 tablet (200 mg of elemental calcium total) by mouth 3 (three) times daily. 90 tablet    Cholecalciferol (VITAMIN D) 2000 units CAPS Take 1 capsule by mouth daily.     ferrous sulfate 325 (65 FE) MG tablet Take 1 tablet by mouth daily.     loratadine (CLARITIN) 10 MG tablet Take 10 mg by mouth daily as needed for allergies.      Multiple Vitamins-Minerals (MULTIVITAMIN PO) Take 1 tablet by mouth daily.      vedolizumab (ENTYVIO) 300 MG injection Inject into the vein. Every eight weeks after his next injection     gabapentin (NEURONTIN) 300 MG capsule Take 2 capsules (600 mg total) by mouth at bedtime. 180 capsule 3  No current facility-administered medications for this visit.    Allergies  Allergen Reactions   Amoxicillin Hives    Hives to trunk and arms Has patient had a PCN reaction causing immediate rash, facial/tongue/throat swelling, SOB or lightheadedness with hypotension: Yes Has patient had a PCN reaction causing severe rash involving mucus membranes or skin necrosis: No Has patient had a PCN reaction that required hospitalization: No Has patient had a PCN reaction occurring within the last 10 years: Yes If all of the above answers are "NO", then may proceed with Cephalosporin use.   Remicade [Infliximab] Anaphylaxis   Lialda [Mesalamine] Other (See Comments)    Worsening of rectal bleeding      Discussed warning signs or symptoms. Please see discharge instructions. Patient expresses understanding.  The above documentation has been reviewed and is accurate and complete [name]

## 2019-09-08 NOTE — Patient Instructions (Addendum)
Thank you for coming in today.  Use the gabapentin as needed.  Use the night splints Recheck as needed.   You had a L wrist injection today.  Call or go to the ER if you develop a large red swollen joint with extreme pain or oozing puss.

## 2019-09-15 DIAGNOSIS — G4733 Obstructive sleep apnea (adult) (pediatric): Secondary | ICD-10-CM

## 2019-09-21 ENCOUNTER — Telehealth: Payer: Self-pay

## 2019-09-21 DIAGNOSIS — G4733 Obstructive sleep apnea (adult) (pediatric): Secondary | ICD-10-CM

## 2019-09-21 NOTE — Telephone Encounter (Signed)
Call made to patient, confirmed DOB, made aware of cpap results:  AHI 34.1, Severe OSA  Recommend:  Cpap auto titrating 5-20 cm  Encourage weight loss measures. Caution against driving while sleepy and against medications with sedative side effects.   Voiced understanding. Order placed.   Nothing further needed at this time.

## 2019-10-26 NOTE — Telephone Encounter (Signed)
Beth, this is a message through my chart from Dr.Oalere's Patient.  Patient was seen 08/12/19 and has just  recently started cpap. I have a download from [employer].  We need to do something different other than the CPAP machine. I have tried 2 different masks and neither are working. I feel like I'm suffocating with them. The hose and the mask itself are fine. I think I can get used to them but when I dose off and the pressure builds in the mask it wakes me up and I feel like I'm suffocating. My chest feels really tight and I cant catch my breath. I do have sinus issues so I'm not sure if that may have something to do with it but I will not be using this machine.   Message routed to [place], NP (provider of the day)

## 2019-10-26 NOTE — Telephone Encounter (Signed)
Dr. Ander Slade is here tomorrow, please forward to him to address.

## 2019-10-26 NOTE — Telephone Encounter (Signed)
Will route message to Dr. Ander Slade to review download and make recommendations for Patient 10/27/2019.

## 2019-10-26 NOTE — Telephone Encounter (Signed)
I will be in the office tomorrow  Kindly remind me to look at this More appropriate decision following reviewing the download  Jaynee Eagles

## 2019-10-27 ENCOUNTER — Telehealth: Payer: Self-pay | Admitting: Pulmonary Disease

## 2019-10-27 DIAGNOSIS — G4733 Obstructive sleep apnea (adult) (pediatric): Secondary | ICD-10-CM

## 2019-10-27 NOTE — Telephone Encounter (Signed)
Called patient and left message on his voicemail  Will reduce your pressure settings from 5-20 down to 5-9  Use Flonase or Nasonex for nasal stuffiness/congestion  Call as needed Try and use CPAP on a regular basis   DME order for CPAP reduction to 5-9

## 2019-10-27 NOTE — Telephone Encounter (Signed)
New DME order placed to change cpap settings.  A message has been sent to patient through my chart with Dr. Judson Roch recommendations.  Nothing further at this time.

## 2019-10-27 NOTE — Telephone Encounter (Signed)
Called patient and left message on his voicemail  Will reduce pressure settings from 5-20 down 5-9  Use Flonase or Nasonex for nasal stuffiness/congestion  Call as needed Try and use CPAP on a regular basis  DME order for CPAP reduction to 5-9

## 2019-10-28 NOTE — Telephone Encounter (Signed)
See last message from pt, just FYI AO.

## 2019-12-30 ENCOUNTER — Other Ambulatory Visit: Payer: Self-pay | Admitting: Internal Medicine

## 2019-12-30 MED ORDER — CELECOXIB 200 MG PO CAPS: 200.0000 mg | ORAL_CAPSULE | Freq: Two times a day (BID) | ORAL | 1 refills | Status: DC

## 2020-01-24 DIAGNOSIS — I493 Ventricular premature depolarization: Secondary | ICD-10-CM | POA: Insufficient documentation

## 2020-02-06 DIAGNOSIS — Z7189 Other specified counseling: Secondary | ICD-10-CM | POA: Insufficient documentation

## 2020-02-06 DIAGNOSIS — R002 Palpitations: Secondary | ICD-10-CM | POA: Insufficient documentation

## 2020-02-06 DIAGNOSIS — G4730 Sleep apnea, unspecified: Secondary | ICD-10-CM | POA: Insufficient documentation

## 2020-02-06 NOTE — Progress Notes (Signed)
Cardiology Office Note   Date:  02/08/2020   ID:  John [name], DOB Jul 05, 1981, MRN [id number]  PCP:  Sueanne Margarita, DO  Cardiologist:   No primary care provider on file. Referring:  Sueanne Margarita, DO  Chief Complaint  Patient presents with  . Palpitations      History of Present Illness: John A Dermody is a 39 y.o. male who is referred by Sueanne Margarita, DO for evaluation of palpitations.  The patient has no past cardiac history.  He said that few weeks ago he was having palpitations.  He was describing skipping beats.  It would happen when he was resting or if he was doing something like walking.  It was sporadic.  He could not bring it on.  There was no activity that made it happen.  He does drink a Monster drink daily and he stopped doing this and it made no difference.  He would feel skipping beats.  These were isolated skipped beats that he might have several in a few minutes and it would go away.  He was not describing sustained tachyarrhythmias.  There were no associated symptoms such as shortness of breath.  He was not having any presyncope or syncope.  He denies any chest pressure, neck or arm discomfort.  He does have sleep apnea which was diagnosed recently but he could not wear any of the masks.  He is not overly physically active because of chronic hip problems.  He has Perthes disease.  He might ultimately need some surgery for this.  Past Medical History:  Diagnosis Date  . Acute anal fissure 10/27/2015  . Esophageal reflux 06/08/2014  . GERD (gastroesophageal reflux disease)   . Hemorrhoids, internal, with bleeding 06/08/2014  . Long-term use of immunosuppressant medication - Entyvio 07/31/2015   QuantiFERON due May 2019, getting Weyman Rodney at [facility]  . Perirectal abscess 07/31/2015  . 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 Sleep apnea   . UC (ulcerative colitis) ([place])   . Vitamin D deficiency  04/03/2016    Past Surgical History:  Procedure Laterality Date  . BIOPSY  04/11/2018   Procedure: BIOPSY;  Surgeon: Ladene Artist, MD;  Location: [facility];  Service: Endoscopy;;  . COLONOSCOPY N/A 04/11/2018   Procedure: COLONOSCOPY;  Surgeon: Ladene Artist, MD;  Location: [facility];  Service: Endoscopy;  Laterality: N/A;  . ESOPHAGOGASTRODUODENOSCOPY N/A 07/11/2015   Procedure: ESOPHAGOGASTRODUODENOSCOPY (EGD);  Surgeon: Jerene Bears, MD;  Location: [facility];  Service: Gastroenterology;  Laterality: N/A;  . FLEXIBLE SIGMOIDOSCOPY N/A 07/11/2015   Procedure: FLEXIBLE SIGMOIDOSCOPY;  Surgeon: Jerene Bears, MD;  Location: [facility];  Service: Gastroenterology;  Laterality: N/A;  . FLEXIBLE SIGMOIDOSCOPY N/A 04/17/2018   Procedure: FLEXIBLE SIGMOIDOSCOPY;  Surgeon: Gatha Mayer, MD;  Location: [facility];  Service: Endoscopy;  Laterality: N/A;  . INCISION AND DRAINAGE PERIRECTAL ABSCESS N/A 07/24/2015   Procedure: IRRIGATION AND DEBRIDEMENT PERIRECTAL ABSCESS;  Surgeon: Alphonsa Overall, MD;  Location: [facility];  Service: General;  Laterality: N/A;  . KNEE ARTHROSCOPY Left   . TYMPANOSTOMY TUBE PLACEMENT Bilateral      Current Outpatient Medications  Medication Sig Dispense Refill  . calcium carbonate (TUMS) 500 MG chewable tablet Chew 1 tablet (200 mg of elemental calcium total) by mouth 3 (three) times daily. 90 tablet   . celecoxib (CELEBREX) 200 MG capsule Take 1 capsule (200 mg total) by mouth  2 (two) times daily. 60 capsule 1  . Cholecalciferol (VITAMIN D) 2000 units CAPS Take 1 capsule by mouth daily.    . ferrous sulfate 325 (65 FE) MG tablet Take 1 tablet by mouth daily.    Marland Kitchen gabapentin (NEURONTIN) 300 MG capsule Take 2 capsules (600 mg total) by mouth at bedtime. 180 capsule 3  . loratadine (CLARITIN) 10 MG tablet Take 10 mg by mouth daily as needed for allergies.     . Multiple Vitamins-Minerals (MULTIVITAMIN PO) Take 1 tablet by mouth daily.     . vedolizumab  (ENTYVIO) 300 MG injection Inject into the vein.     No current facility-administered medications for this visit.    Allergies:   Amoxicillin, Remicade [infliximab], Clindamycin, and Lialda [mesalamine]    Social History:  The patient  reports that he has never smoked. He has never used smokeless tobacco. He reports that he does not drink alcohol or use drugs.   Family History:  The patient's family history includes Colon cancer in his paternal grandfather; Colon cancer (age of onset: 47) in his father; Diabetes in his father; Heart disease in his father, maternal grandfather, maternal grandmother, paternal grandfather, and paternal grandmother; Hyperlipidemia in his father and mother; Hypertension in his brother and mother; Mental illness in his maternal grandfather.    ROS:  Please see the history of present illness.   Otherwise, review of systems are positive for none.   All other systems are reviewed and negative.    PHYSICAL EXAM: VS:  BP 120/88   Pulse 77   Ht 5' 5"  (1.651 m)   Wt 212 lb 3.2 oz (96.3 kg)   SpO2 97%   BMI 35.31 kg/m  , BMI Body mass index is 35.31 kg/m. GENERAL:  Well appearing HEENT:  Pupils equal round and reactive, fundi not visualized, oral mucosa unremarkable NECK:  No jugular venous distention, waveform within normal limits, carotid upstroke brisk and symmetric, no bruits, no thyromegaly LYMPHATICS:  No cervical, inguinal adenopathy LUNGS:  Clear to auscultation bilaterally BACK:  No CVA tenderness CHEST:  Unremarkable HEART:  PMI not displaced or sustained,S1 and S2 within normal limits, no S3, no S4, no clicks, no rubs, no murmurs ABD:  Flat, positive bowel sounds normal in frequency in pitch, no bruits, no rebound, no guarding, no midline pulsatile mass, no hepatomegaly, no splenomegaly EXT:  2 plus pulses throughout, no edema, no cyanosis no clubbing SKIN:  No rashes no nodules NEURO:  Cranial nerves II through XII grossly intact, motor grossly  intact throughout PSYCH:  Cognitively intact, oriented to person place and time    EKG:  EKG is ordered today. The ekg ordered today demonstrates sinus rhythm, rate seventy-seven, axis within normal limits, intervals within normal limits, no acute ST-T wave changes.   Recent Labs: 08/06/2019: Hemoglobin 13.7; Platelets 328.0; TSH 2.03    Lipid Panel    Component Value Date/Time   CHOL 187 11/15/2013 1556   TRIG 243 (H) 11/15/2013 1556   HDL 33 (L) 11/15/2013 1556   CHOLHDL 5.7 11/15/2013 1556   VLDL 49 (H) 11/15/2013 1556   LDLCALC 105 (H) 11/15/2013 1556      Wt Readings from Last 3 Encounters:  02/08/20 212 lb 3.2 oz (96.3 kg)  09/08/19 216 lb 9.6 oz (98.2 kg)  08/12/19 219 lb 3.2 oz (99.4 kg)      Other studies Reviewed: Additional studies/ records that were reviewed today include: Labs. Review of the above records demonstrates:  Please  see elsewhere in the note.     ASSESSMENT AND PLAN:  PALPITATIONS: The patient has palpitations as described above.  I suspect he is describing premature atrial or ventricular contractions.  He has a normal physical exam and EKG.  I will check electrolytes and a TSH although TSH and T3-T4 in the fall were unremarkable.  At this point has not had any symptoms in a couple of weeks and so I do not think ordering a wearable monitor would be helpful.  He is instructed to get an Alive Cor we can try to record these.  If however they have abated no further work-up is needed.  SLEEP APNEA:   We talked about weight loss as a strategy for treatment of his sleep apnea.   Current medicines are reviewed at length with the patient today.  The patient does not have concerns regarding medicines.  The following changes have been made:  no change  Labs/ tests ordered today include: None  Orders Placed This Encounter  Procedures  . Basic metabolic panel  . TSH  . Magnesium  . EKG 12-Lead     Disposition:   FU with me as needed.       Signed, Minus Breeding, MD  02/08/2020 3:34 PM    [facility]

## 2020-02-07 ENCOUNTER — Ambulatory Visit: Payer: Self-pay | Admitting: Cardiology

## 2020-02-08 ENCOUNTER — Encounter: Payer: Self-pay | Admitting: Cardiology

## 2020-02-08 ENCOUNTER — Ambulatory Visit: Payer: Managed Care, Other (non HMO) | Admitting: Cardiology

## 2020-02-08 ENCOUNTER — Other Ambulatory Visit: Payer: Self-pay

## 2020-02-08 VITALS — BP 120/88 | HR 77 | Ht 65.0 in | Wt 212.2 lb

## 2020-02-08 DIAGNOSIS — Z7189 Other specified counseling: Secondary | ICD-10-CM

## 2020-02-08 DIAGNOSIS — G4730 Sleep apnea, unspecified: Secondary | ICD-10-CM

## 2020-02-08 DIAGNOSIS — R002 Palpitations: Secondary | ICD-10-CM | POA: Diagnosis not present

## 2020-02-08 NOTE — Patient Instructions (Addendum)
Medication Instructions:  NO CHANGES *If you need a refill on your cardiac medications before your next appointment, please call your pharmacy*  Lab Work: Your physician recommends that you return for lab work today (BMP, MAG, TSH) If you have labs (blood work) drawn today and your tests are completely normal, you will receive your results only by: Marland Kitchen MyChart Message (if you have MyChart) OR . A paper copy in the mail If you have any lab test that is abnormal or we need to change your treatment, we will call you to review the results.   Testing/Procedures: NONE ORDERED THIS VISIT  Follow-Up: At [facility], you and your health needs are our priority.  As part of our continuing mission to provide you with exceptional heart care, we have created designated Provider Care Teams.  These Care Teams include your primary Cardiologist (physician) and Advanced Practice Providers (APPs -  Physician Assistants and Nurse Practitioners) who all work together to provide you with the care you need, when you need it.  Other Instructions FOLLOW UP AS NEEDED  ALIVECOR BY Jodelle Red

## 2020-02-08 NOTE — Progress Notes (Signed)
Work note for pt created.

## 2020-02-09 LAB — TSH: TSH: 4.14 u[IU]/mL (ref 0.450–4.500)

## 2020-02-09 LAB — BASIC METABOLIC PANEL
BUN/Creatinine Ratio: 13 (ref 9–20)
BUN: 13 mg/dL (ref 6–24)
CO2: 24 mmol/L (ref 20–29)
Calcium: 9.8 mg/dL (ref 8.7–10.2)
Chloride: 103 mmol/L (ref 96–106)
Creatinine, Ser: 1 mg/dL (ref 0.76–1.27)
GFR calc Af Amer: 108 mL/min/{1.73_m2} (ref >59)
GFR calc non Af Amer: 94 mL/min/{1.73_m2} (ref >59)
Glucose: 71 mg/dL (ref 65–99)
Potassium: 4.1 mmol/L (ref 3.5–5.2)
Sodium: 141 mmol/L (ref 134–144)

## 2020-02-09 LAB — MAGNESIUM: Magnesium: 2 mg/dL (ref 1.6–2.3)

## 2020-03-08 ENCOUNTER — Other Ambulatory Visit: Payer: Self-pay | Admitting: Internal Medicine

## 2020-03-08 NOTE — Telephone Encounter (Signed)
Per his wife the celebrex is helping. May I refill? She said he is going to be getting a hip replacement in the fall.

## 2020-04-26 ENCOUNTER — Other Ambulatory Visit: Payer: Self-pay | Admitting: Internal Medicine

## 2020-04-26 DIAGNOSIS — K5100 Ulcerative (chronic) pancolitis without complications: Secondary | ICD-10-CM

## 2020-04-26 DIAGNOSIS — Z01818 Encounter for other preprocedural examination: Secondary | ICD-10-CM

## 2020-05-08 ENCOUNTER — Encounter: Payer: Self-pay | Admitting: Internal Medicine

## 2020-05-10 ENCOUNTER — Ambulatory Visit (INDEPENDENT_AMBULATORY_CARE_PROVIDER_SITE_OTHER): Payer: Managed Care, Other (non HMO)

## 2020-05-10 ENCOUNTER — Other Ambulatory Visit: Payer: Self-pay | Admitting: Internal Medicine

## 2020-05-10 DIAGNOSIS — Z1159 Encounter for screening for other viral diseases: Secondary | ICD-10-CM

## 2020-05-11 LAB — SARS CORONAVIRUS 2 (TAT 6-24 HRS): SARS Coronavirus 2: NEGATIVE

## 2020-05-12 ENCOUNTER — Encounter: Payer: Self-pay | Admitting: Internal Medicine

## 2020-05-12 ENCOUNTER — Other Ambulatory Visit: Payer: Self-pay

## 2020-05-12 ENCOUNTER — Ambulatory Visit (AMBULATORY_SURGERY_CENTER): Payer: Managed Care, Other (non HMO) | Admitting: Internal Medicine

## 2020-05-12 VITALS — BP 113/70 | HR 74 | Temp 98.6°F | Resp 14 | Ht 65.0 in | Wt 212.0 lb

## 2020-05-12 DIAGNOSIS — K5100 Ulcerative (chronic) pancolitis without complications: Secondary | ICD-10-CM

## 2020-05-12 DIAGNOSIS — K51419 Inflammatory polyps of colon with unspecified complications: Secondary | ICD-10-CM

## 2020-05-12 DIAGNOSIS — K5140 Inflammatory polyps of colon without complications: Secondary | ICD-10-CM

## 2020-05-12 DIAGNOSIS — R809 Proteinuria, unspecified: Secondary | ICD-10-CM | POA: Insufficient documentation

## 2020-05-12 DIAGNOSIS — K51418 Inflammatory polyps of colon with other complication: Secondary | ICD-10-CM

## 2020-05-12 DIAGNOSIS — D125 Benign neoplasm of sigmoid colon: Secondary | ICD-10-CM

## 2020-05-12 MED ORDER — SODIUM CHLORIDE 0.9 % IV SOLN: 500.0000 mL | Freq: Once | INTRAVENOUS | Status: DC

## 2020-05-12 NOTE — Patient Instructions (Addendum)
Many pseudopolyps seen today - these are scars from prior colitis flares.  Some may be inflamed but overall the colitis seems to be in remission.  I will let you know results soon.  I appreciate the opportunity to care for you.  Gatha Mayer, MD, FACG    YOU HAD AN ENDOSCOPIC PROCEDURE TODAY AT [facility]:   Refer to the procedure report that was given to you for any specific questions about what was found during the examination.  If the procedure report does not answer your questions, please call your gastroenterologist to clarify.  If you requested that your care partner not be given the details of your procedure findings, then the procedure report has been included in a sealed envelope for you to review at your convenience later.  YOU SHOULD EXPECT: Some feelings of bloating in the abdomen. Passage of more gas than usual.  Walking can help get rid of the air that was put into your GI tract during the procedure and reduce the bloating. If you had a lower endoscopy (such as a colonoscopy or flexible sigmoidoscopy) you may notice spotting of blood in your stool or on the toilet paper. If you underwent a bowel prep for your procedure, you may not have a normal bowel movement for a few days.  Please Note:  You might notice some irritation and congestion in your nose or some drainage.  This is from the oxygen used during your procedure.  There is no need for concern and it should clear up in a day or so.  SYMPTOMS TO REPORT IMMEDIATELY:   Following lower endoscopy (colonoscopy or flexible sigmoidoscopy):  Excessive amounts of blood in the stool  Significant tenderness or worsening of abdominal pains  Swelling of the abdomen that is new, acute  Fever of 100F or higher  For urgent or emergent issues, a gastroenterologist can be reached at any hour by calling [phone]. Do not use MyChart messaging for urgent concerns.    DIET:  We do recommend a small meal at  first, but then you may proceed to your regular diet.  Drink plenty of fluids but you should avoid alcoholic beverages for 24 hours.  ACTIVITY:  You should plan to take it easy for the rest of today and you should NOT DRIVE or use heavy machinery until tomorrow (because of the sedation medicines used during the test).    FOLLOW UP: Our staff will call the number listed on your records 48-72 hours following your procedure to check on you and address any questions or concerns that you may have regarding the information given to you following your procedure. If we do not reach you, we will leave a message.  We will attempt to reach you two times.  During this call, we will ask if you have developed any symptoms of COVID 19. If you develop any symptoms (ie: fever, flu-like symptoms, shortness of breath, cough etc.) before then, please call [phone].  If you test positive for Covid 19 in the 2 weeks post procedure, please call and report this information to [place].    If any biopsies were taken you will be contacted by phone or by letter within the next 1-3 weeks.  Please call us at [phone] if you have not heard about the biopsies in 3 weeks.    SIGNATURES/CONFIDENTIALITY: You and/or your care partner have signed paperwork which will be entered into your electronic medical record.  These signatures attest to the  fact that that the information above on your After Visit Summary has been reviewed and is understood.  Full responsibility of the confidentiality of this discharge information lies with you and/or your care-partner.

## 2020-05-12 NOTE — Progress Notes (Signed)
Pt's states no medical or surgical changes since previsit or office visit.  CW - vitals

## 2020-05-12 NOTE — Progress Notes (Signed)
PT taken to PACU. Monitors in place. VSS. Report given to RN. 

## 2020-05-12 NOTE — Op Note (Signed)
[facility] Patient Name: John [name] Procedure Date: 05/12/2020 2:15 PM MRN: [id number] Endoscopist: Gatha Mayer , MD Age: 39 Referring MD:  Date of Birth: 03/31/1981 Gender: Male Account #: [id number] Procedure:                Colonoscopy Indications:              Chronic ulcerative pancolitis, Follow-up of chronic                            ulcerative pancolitis, Disease activity assessment                            of chronic ulcerative pancolitis, Assess                            therapeutic response to therapy of chronic                            ulcerative pancolitis Medicines:                Propofol per Anesthesia, Monitored Anesthesia Care Procedure:                Pre-Anesthesia Assessment:                           - Prior to the procedure, a History and Physical                            was performed, and patient medications and                            allergies were reviewed. The patient's tolerance of                            previous anesthesia was also reviewed. The risks                            and benefits of the procedure and the sedation                            options and risks were discussed with the patient.                            All questions were answered, and informed consent                            was obtained. Prior Anticoagulants: The patient has                            taken no previous anticoagulant or antiplatelet                            agents. ASA Grade Assessment: II - A patient with  mild systemic disease. After reviewing the risks                            and benefits, the patient was deemed in                            satisfactory condition to undergo the procedure.                           After obtaining informed consent, the colonoscope                            was passed under direct vision. Throughout the                            procedure, the patient's blood  pressure, pulse, and                            oxygen saturations were monitored continuously. The                            Colonoscope was introduced through the anus and                            advanced to the the cecum, identified by                            appendiceal orifice and ileocecal valve. The                            colonoscopy was performed without difficulty. The                            patient tolerated the procedure well. The quality                            of the bowel preparation was good. The bowel                            preparation used was Miralax via split dose                            instruction. The ileocecal valve, appendiceal                            orifice, and rectum were photographed. Scope In: 2:29:34 PM Scope Out: 2:50:40 PM Scope Withdrawal Time: 0 hours 18 minutes 57 seconds  Total Procedure Duration: 0 hours 21 minutes 6 seconds  Findings:                 The perianal and digital rectal examinations were                            normal. Pertinent negatives include normal prostate                            (  size, shape, and consistency).                           Diffuse pseudopolyps were found in the entire                            colon. Biopsies were taken with a cold forceps for                            histology. Verification of patient identification                            for the specimen was done. Estimated blood loss was                            minimal. The polyp was removed with a cold snare.                            Resection and retrieval were complete. Verification                            of patient identification for the specimen was                            done. Estimated blood loss was minimal.                           Internal hemorrhoids were found.                           The exam was otherwise without abnormality on                            direct and retroflexion  views. Complications:            No immediate complications. Estimated Blood Loss:     Estimated blood loss was minimal. Impression:               - Pseudopolyps in the entire examined colon. One                            Resected and retrieved. Biopsied extensively                            otherwise.                           - Internal hemorrhoids.                           - The examination was otherwise normal on direct                            and retroflexion views. Recommendation:           - Patient has a contact number available for  emergencies. The signs and symptoms of potential                            delayed complications were discussed with the                            patient. Return to normal activities tomorrow.                            Written discharge instructions were provided to the                            patient.                           - Resume previous diet.                           - Continue present medications.                           - Await pathology results.                           - Repeat colonoscopy is recommended for                            surveillance. The colonoscopy date will be                            determined after pathology results from today's                            exam become available for review. Gatha Mayer, MD 05/12/2020 3:04:00 PM This report has been signed electronically.

## 2020-05-12 NOTE — Progress Notes (Signed)
Called to room to assist during endoscopic procedure.  Patient ID and intended procedure confirmed with present staff. Received instructions for my participation in the procedure from the performing physician.  

## 2020-05-13 ENCOUNTER — Other Ambulatory Visit: Payer: Self-pay | Admitting: Internal Medicine

## 2020-05-15 ENCOUNTER — Telehealth: Payer: Self-pay

## 2020-05-15 NOTE — Telephone Encounter (Signed)
Work note requested for patient to take to work. He works 3rd shift and had to leave work early on 05/11/2020 to prep for the colonoscopy on 05/12/2020.

## 2020-05-16 ENCOUNTER — Telehealth: Payer: Self-pay

## 2020-05-16 NOTE — Telephone Encounter (Signed)
NO ANSWER, MESSAGE LEFT FOR PATIENT.

## 2020-05-16 NOTE — Telephone Encounter (Signed)
2nd follow up call made.  NALM 

## 2020-05-22 ENCOUNTER — Encounter: Payer: Self-pay | Admitting: Internal Medicine

## 2020-06-02 ENCOUNTER — Encounter: Payer: Managed Care, Other (non HMO) | Admitting: Internal Medicine

## 2020-06-17 ENCOUNTER — Other Ambulatory Visit: Payer: Self-pay | Admitting: Internal Medicine

## 2020-10-17 NOTE — Progress Notes (Signed)
I, John [name], LAT, ATC acting as a scribe for John Leader, MD.  John [name] is a 39 y.o. male who presents to [facility] [facility] today for continued bilat wrist pain. 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 Pt was last seen by Dr. Georgina Snell on 09/08/19 and was advised to wear night splints and gabapentin prn. Today, pt reports numbness into 3-4th fingers and into thumb. Pt has been compliant wearing night splints and taking gabapentin prn.  Numbness/tingling: yes Grip strength: weak   Pertinent review of systems: No fevers or chills  Relevant historical information: Ulcerative colitis.  Patient works as a Engineer, building services.   Exam:  BP 140/88 (BP Location: Right Arm, Patient Position: Sitting, Cuff Size: Normal)   Pulse (!) 106   Ht 5' 5"  (1.651 m)   Wt 229 lb (103.9 kg)   SpO2 98%   BMI 38.11 kg/m  General: Well Developed, well nourished, and in no acute distress.   MSK: Hands bilaterally no atrophy.  Intact normal grip strength. Sensation pulses and cap refill are intact distally.    Lab and Radiology Results  Procedure: Real-time Ultrasound Guided hydrodissection of right median nerve at carpal tunnel Device: Philips Affiniti 50G Images permanently stored and available for review in PACS Verbal informed consent obtained.  Discussed risks and benefits of procedure. Warned about infection bleeding damage to structures skin hypopigmentation and fat atrophy among others. Patient expresses understanding and agreement Time-out conducted.   Noted no overlying erythema, induration, or other signs of local infection.   Skin prepped in a sterile fashion.   Local anesthesia: Topical Ethyl chloride.   With sterile technique and under real time ultrasound guidance:  40 mg of Kenalog and 1 mL of lidocaine injected into carpal tunnel around the median nerve. Fluid seen entering the carpal tunnel space.   Completed without difficulty    Pain immediately resolved suggesting accurate placement of the medication.   Advised to call if fevers/chills, erythema, induration, drainage, or persistent bleeding.   Images permanently stored and available for review in the ultrasound unit.  Impression: Technically successful ultrasound guided injection.   Procedure: Real-time Ultrasound Guided hydrodissection of left median nerve at carpal tunnel Device: Philips Affiniti 50G Images permanently stored and available for review in PACS Verbal informed consent obtained.  Discussed risks and benefits of procedure. Warned about infection bleeding damage to structures skin hypopigmentation and fat atrophy among others. Patient expresses understanding and agreement Time-out conducted.   Noted no overlying erythema, induration, or other signs of local infection.   Skin prepped in a sterile fashion.   Local anesthesia: Topical Ethyl chloride.   With sterile technique and under real time ultrasound guidance:  40 mg of Kenalog and 1 mL of lidocaine injected into carpal tunnel around the median nerve. Fluid seen entering the carpal tunnel.   Completed without difficulty   Pain immediately resolved suggesting accurate placement of the medication.   Advised to call if fevers/chills, erythema, induration, drainage, or persistent bleeding.   Images permanently stored and available for review in the ultrasound unit.  Impression: Technically successful ultrasound guided injection.           Assessment and Plan: 39 y.o. male with bilateral carpal tunnel syndrome.  This is now his third injection on the left and second on his right in a few years.  The injections seem to be lasting quite a long time so were continuing to do them.  Spent time discussing potential next steps including nerve conduction study and ultimately surgery.  Plan for injection today and continued conservative management.  Check back as needed.   PDMP not reviewed this  encounter. Orders Placed This Encounter  Procedures  . [place] LIMITED JOINT SPACE STRUCTURES UP BILAT(NO LINKED CHARGES)    Standing Status:   Future    Number of Occurrences:   1    Standing Expiration Date:   04/17/2021    Order Specific Question:   Reason for Exam (SYMPTOM  OR DIAGNOSIS REQUIRED)    Answer:   Bilateral wrist pain    Order Specific Question:   Preferred imaging location?    Answer:   [facility]   No orders of the defined types were placed in this encounter.    Discussed warning signs or symptoms. Please see discharge instructions. Patient expresses understanding.   The above documentation has been reviewed and is accurate and complete John [name], M.D.

## 2020-10-18 ENCOUNTER — Ambulatory Visit: Payer: Self-pay

## 2020-10-18 ENCOUNTER — Other Ambulatory Visit: Payer: Self-pay

## 2020-10-18 ENCOUNTER — Ambulatory Visit (INDEPENDENT_AMBULATORY_CARE_PROVIDER_SITE_OTHER): Payer: Managed Care, Other (non HMO) | Admitting: Family Medicine

## 2020-10-18 VITALS — BP 140/88 | HR 106 | Ht 65.0 in | Wt 229.0 lb

## 2020-10-18 DIAGNOSIS — G5603 Carpal tunnel syndrome, bilateral upper limbs: Secondary | ICD-10-CM | POA: Diagnosis not present

## 2020-10-18 NOTE — Patient Instructions (Signed)
Thank you for coming in today.  Take it easy with your wrists if able for a few weeks.   If these shots stop working let me know.   We will do a nerve study to confirm the diagnosis and plan for surgery.

## 2020-12-12 ENCOUNTER — Telehealth: Payer: Self-pay

## 2020-12-12 NOTE — Telephone Encounter (Signed)
Patient called in and needs a letter for upcoming travel to [place].

## 2020-12-12 NOTE — Telephone Encounter (Signed)
Letter created for patient to be able to access bathroom as needed per patient request.

## 2021-10-31 ENCOUNTER — Ambulatory Visit: Payer: Managed Care, Other (non HMO) | Admitting: Family Medicine

## 2021-10-31 ENCOUNTER — Ambulatory Visit (INDEPENDENT_AMBULATORY_CARE_PROVIDER_SITE_OTHER): Payer: Managed Care, Other (non HMO)

## 2021-10-31 ENCOUNTER — Ambulatory Visit: Payer: Self-pay

## 2021-10-31 ENCOUNTER — Other Ambulatory Visit: Payer: Self-pay

## 2021-10-31 VITALS — BP 168/102 | HR 83 | Ht 65.0 in | Wt 232.8 lb

## 2021-10-31 DIAGNOSIS — M25511 Pain in right shoulder: Secondary | ICD-10-CM | POA: Diagnosis not present

## 2021-10-31 NOTE — Patient Instructions (Addendum)
Thank you for coming in today.   Please get an Xray today before you leave   You received a steroid injection today. Seek immediate medical attention if the joint becomes red, extremely painful, or is oozing fluid.   I've referred you to Physical Therapy.  Let us know if you don't hear from them in one week.   Recheck back in 6 weeks

## 2021-10-31 NOTE — Progress Notes (Signed)
I, John [name], LAT, ATC acting as a scribe for John Leader, MD.  John [name] is a 41 y.o. male who presents to [place] at [facility] today for R shoulder pain. 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 Today, pt c/o R shoulder pain x 2 months, worsening over the last couple days. Pt felt a "pull" in his shoulder when rolling over in bed. Pt locates pain to upper arm to superior aspect of R shoulder. Pt is limited in shoulder ABD AROM.  Radiates: no Aggravates: shoulder ABD, when resting his arm on an arm rest Treatments tried: stretching, muscle relaxer, Tylenol  Pertinent review of systems: No fevers or chills  Relevant historical information: Patient works as a Engineer, building services.   Exam:  BP (!) 168/102    Pulse 83    Ht 5\' 5"  (1.651 m)    Wt 232 lb 12.8 oz (105.6 kg)    SpO2 96%    BMI 38.74 kg/m  General: Well Developed, well nourished, and in no acute distress.   MSK: Right shoulder normal-appearing Normal range of motion.  Pain with abduction and internal rotation. Nontender. Strength 4/5 to abduction.  Intact strength external and internal rotation. Positive Hawkins and Neer's test.  Negative Yergason's and speeds test. Ulcers capillary refill and sensation are intact distally.  Lab and Radiology Results No results found for this or any previous visit (from the past 72 hour(s)). DG Shoulder Right  Result Date: 10/31/2021 CLINICAL DATA:  Right shoulder pain. EXAM: RIGHT SHOULDER - 2+ VIEW COMPARISON:  None. FINDINGS: There is no evidence of fracture or dislocation. There is no evidence of arthropathy or other focal bone abnormality. Soft tissues are unremarkable. IMPRESSION: Negative. Electronically Signed   By: Anner Crete M.D.   On: 10/31/2021 22:10   I, John [name], personally (independently) visualized and performed the interpretation of the images attached in this note.   Procedure: Real-time Ultrasound Guided Injection  of right shoulder subacromial bursa Device: Philips Affiniti 50G Images permanently stored and available for review in PACS Ultrasound evaluation prior to injection reveals intact biceps tendon.  Intact subscapularis rotator cuff tendon. Supraspinatus tendon does have a area of hypoechoic change mid substance distal which could be a partial rotator cuff tear without retraction.  Infraspinatus tendon is intact. Moderate subacromial bursitis present. Verbal informed consent obtained.  Discussed risks and benefits of procedure. Warned about infection bleeding damage to structures skin hypopigmentation and fat atrophy among others. Patient expresses understanding and agreement Time-out conducted.   Noted no overlying erythema, induration, or other signs of local infection.   Skin prepped in a sterile fashion.   Local anesthesia: Topical Ethyl chloride.   With sterile technique and under real time ultrasound guidance: 40 mg of Kenalog and 2 mL of Marcaine injected into subacromial bursa. Fluid seen entering the bursa.   Completed without difficulty   Pain immediately resolved suggesting accurate placement of the medication.   Advised to call if fevers/chills, erythema, induration, drainage, or persistent bleeding.   Images permanently stored and available for review in the ultrasound unit.  Impression: Technically successful ultrasound guided injection.      Assessment and Plan: 41 y.o. male with right shoulder pain thought to be due to subacromial bursitis and possible partial rotator cuff tear.  Plan for steroid injection and physical therapy. Recheck in 6 week   PDMP not reviewed this encounter. Orders Placed This Encounter  Procedures   DG  Shoulder Right    Standing Status:   Future    Number of Occurrences:   1    Standing Expiration Date:   10/31/2022    Order Specific Question:   Reason for Exam (SYMPTOM  OR DIAGNOSIS REQUIRED)    Answer:   right shoulder pain    Order Specific  Question:   Preferred imaging location?    Answer:   [facility]   [place] LIMITED JOINT SPACE STRUCTURES UP RIGHT(NO LINKED CHARGES)    Standing Status:   Future    Number of Occurrences:   1    Standing Expiration Date:   04/30/2022    Order Specific Question:   Reason for Exam (SYMPTOM  OR DIAGNOSIS REQUIRED)    Answer:   right shoulder pain    Order Specific Question:   Preferred imaging location?    Answer:   [place]   Ambulatory referral to Physical Therapy    Referral Priority:   Routine    Referral Type:   Physical Medicine    Referral Reason:   Specialty Services Required    Requested Specialty:   Physical Therapy    Number of Visits Requested:   1   No orders of the defined types were placed in this encounter.    Discussed warning signs or symptoms. Please see discharge instructions. Patient expresses understanding.   The above documentation has been reviewed and is accurate and complete John [name], M.D.

## 2021-11-01 NOTE — Progress Notes (Signed)
Right shoulder x-ray looks normal to radiology

## 2021-11-02 ENCOUNTER — Other Ambulatory Visit: Payer: Self-pay | Admitting: Internal Medicine

## 2021-11-02 ENCOUNTER — Encounter: Payer: Self-pay | Admitting: Family Medicine

## 2021-11-02 ENCOUNTER — Other Ambulatory Visit: Payer: Self-pay

## 2021-11-02 ENCOUNTER — Other Ambulatory Visit: Payer: Self-pay | Admitting: Pharmacy Technician

## 2021-11-02 ENCOUNTER — Telehealth: Payer: Self-pay

## 2021-11-02 DIAGNOSIS — Z7960 Long term (current) use of unspecified immunomodulators and immunosuppressants: Secondary | ICD-10-CM

## 2021-11-02 NOTE — Progress Notes (Signed)
Change to [facility] center from [facility]  Last infusion there was 10/29/21  On Vedolizumab 300 mg every 8 weeks IV

## 2021-11-02 NOTE — Telephone Encounter (Signed)
Pt wife Earlie Server states that pt was unhappy with the care that he received from [facility] and requested that pt receive his Entyvio Infusions at [facility]. Dr Carlean Purl Notified  and plan was sent to [place]. Staff Message sent to [place]. Pt wife Earlie Server made aware.  Dorothy verbalized understanding with all questions answered. [place] Notified of the change in therapy for the pt.

## 2021-11-05 ENCOUNTER — Encounter: Payer: Self-pay | Admitting: Physical Therapy

## 2021-11-05 ENCOUNTER — Telehealth: Payer: Self-pay | Admitting: Pharmacy Technician

## 2021-11-05 NOTE — Telephone Encounter (Addendum)
Auth Submission: PENDING - CHANGE IN SITE  Payer: [employer] Medication & CPT/J Code(s) submitted: Entyvio (Vedolizumab) [id number] Route of submission (phone, fax, portal): PHONE [phone] FAX [phone] Auth type: Buy/Bill Units/visits requested: Q8WKS Reference number: [id number] - [id number]   Patient is a transfer from [place].   Will update once we receive a response.

## 2021-11-06 ENCOUNTER — Telehealth: Payer: Self-pay

## 2021-11-06 NOTE — Telephone Encounter (Signed)
[place] was notified of transfer of care for pt infusion to [place]: Terance Hart RN at [facility] verbalized understanding with all questions answered.

## 2021-11-15 ENCOUNTER — Encounter: Payer: Self-pay | Admitting: Internal Medicine

## 2021-11-15 NOTE — Telephone Encounter (Addendum)
F/u: John [name]   [facility] - [place] (referral dept) [phone] Will need to give consent for transfer of site before new PA can be processed for [facility]. Left v/m, awaiting call back.   PA still pending. EOC# [id number] [employer]: [phone]

## 2021-11-19 ENCOUNTER — Other Ambulatory Visit: Payer: Self-pay | Admitting: Pharmacy Technician

## 2021-11-20 NOTE — Telephone Encounter (Signed)
F/u entyvio - transfer of site: Spoke with patient. He would like to move forward with the transfer and will call insurance to cancel current Entyvio PA so [facility] can begin auth process.

## 2021-11-20 NOTE — Telephone Encounter (Signed)
Per [facility] ([place]) per notes from [facility], MD would like patient to remain @[facility]  until auth expires 7/23.  [facility] will reach out to patient to see he agrees or would like to move forward with transfer.   Attempted to reach patient to discuss giving consent to cancel current PA with insurance co. in order for [facility] to begin new PA. ([facility] can-not start new PA until consent is given by patient or [facility] to cancel)   Awaiting a call-back.

## 2021-11-23 NOTE — Telephone Encounter (Addendum)
F/u entyvio: Spoke with [employer], they have attempted to reach MD office and or [place] (referral dept) with no success in regards to cancelling current PA, so [facility] can move forward with change in site/new PA.  REF: [place]-T2/3/23 10:19a

## 2021-11-28 NOTE — Telephone Encounter (Signed)
F/u entyvio: Left v/m with [facility] - [place] [phone] in regards to cancelling current PA. Will f/u with response.

## 2021-12-07 ENCOUNTER — Encounter: Payer: Self-pay | Admitting: Family Medicine

## 2021-12-12 ENCOUNTER — Ambulatory Visit: Payer: Managed Care, Other (non HMO) | Admitting: Family Medicine

## 2021-12-12 NOTE — Telephone Encounter (Signed)
F/u. Hessie Dibble transfer. Had conference call with [employer] ([place]) and patient to discuss cancelling current PA with [facility].  Per Christella Scheuermann, patient can not cancel current PA on file with [facility], only MD office can cancel PA.  Patient understood and no questions at that time.   Patient will call [facility] and request cancellation (2nd request).   I have made several attempts to request cancellation with no results or f/u with [facility].

## 2021-12-12 NOTE — Telephone Encounter (Addendum)
Received call from [facility] ([facility]) PA has been cancelled. Ref# [id number].   New auth has been submitted for [facility] Auth Submission: Pending Payer: [employer] Medication & CPT/J Code(s) submitted: Weyman Rodney (Vedolizumab) [phone] Route of submission (phone, fax, portal): phone: [phone] Fax: [phone] Auth type: Buy/Bill Units/visits requested: entyvio q8wks Reference number:  Approval from:   Will f/u with response.  Patient is enrolled in [place] [employer] co-pay card. ID: [id number] BIN: [id number] PCN: [id number] GR: [id number] Active date: 09/21/18 $20,000/YR - Auto re-set and renew every year.

## 2021-12-19 ENCOUNTER — Other Ambulatory Visit: Payer: Self-pay

## 2021-12-19 ENCOUNTER — Ambulatory Visit: Payer: Self-pay

## 2021-12-19 ENCOUNTER — Ambulatory Visit: Payer: Managed Care, Other (non HMO) | Admitting: Family Medicine

## 2021-12-19 VITALS — BP 164/96 | HR 98 | Ht 65.0 in | Wt 227.4 lb

## 2021-12-19 DIAGNOSIS — M65332 Trigger finger, left middle finger: Secondary | ICD-10-CM

## 2021-12-19 DIAGNOSIS — M79642 Pain in left hand: Secondary | ICD-10-CM

## 2021-12-19 DIAGNOSIS — M65312 Trigger thumb, left thumb: Secondary | ICD-10-CM | POA: Diagnosis not present

## 2021-12-19 DIAGNOSIS — M65322 Trigger finger, left index finger: Secondary | ICD-10-CM

## 2021-12-19 DIAGNOSIS — M65342 Trigger finger, left ring finger: Secondary | ICD-10-CM | POA: Diagnosis not present

## 2021-12-19 DIAGNOSIS — M65352 Trigger finger, left little finger: Secondary | ICD-10-CM

## 2021-12-19 NOTE — Patient Instructions (Addendum)
Thank you for coming in today.  ? ?Please use Voltaren gel (Generic Diclofenac Gel) up to 4x daily for pain as needed.  This is available over-the-counter as both the name brand Voltaren gel and the generic diclofenac gel.  ? ?Try using a double Band-aid splint ? ?You received an injection today. Seek immediate medical attention if the joint becomes red, extremely painful, or is oozing fluid.  ? ?Recheck back as needed ? ? ?

## 2021-12-19 NOTE — Progress Notes (Signed)
? ?  I, Peterson Lombard, LAT, ATC acting as a scribe for [name], MD. ? ?John [name] is a 41 y.o. male who presents to [place] at [facility] today for L hand pain in the L 3rd finger. 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 Pt was previously seen by Dr. 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 Today, pt c/o L hand pain in the L 3rd finger x few weeks intermittently w/ triggering. Pt reports pain worsened after running a chainsaw on Sunday. Pt locates pain to 3rd MCP joint more on the palmar aspect. ? ?Swelling: yes ?Grip strength: weakened ?Aggravates: worse in the mornings ?Treatments tried: Tylenol, stretching ? ?Pertinent review of systems: No fevers or chills ? ?Relevant historical information: Ulcerative colitis ? ? ?Exam:  ?BP (!) 164/96   Pulse 98   Ht 5' 5"  (1.651 m)   Wt 227 lb 6.4 oz (103.1 kg)   SpO2 96%   BMI 37.84 kg/m?  ?General: Well Developed, well nourished, and in no acute distress.  ? ?MSK: Left hand normal-appearing ?Tender palpation third MCP palmar aspect. ?Triggering present with flexion of PIP joint. ? ? ? ?Lab and Radiology Results ? ?Procedure: Real-time Ultrasound Guided Injection of left third MCP palmar aspect flexor tendon sheath ?Device: Philips Affiniti 50G ?Images permanently stored and available for review in PACS ?Verbal informed consent obtained.  Discussed risks and benefits of procedure. Warned about infection bleeding damage to structures skin hypopigmentation and fat atrophy among others. ?Patient expresses understanding and agreement ?Time-out conducted.   ?Noted no overlying erythema, induration, or other signs of local infection.   ?Skin prepped in a sterile fashion.   ?Local anesthesia: Topical Ethyl chloride.   ?With sterile technique and under real time ultrasound guidance: 40 mg of Kenalog and 1 mL of lidocaine injected into tendon sheath at A1 pulley third MCP. Fluid seen entering the tendon  sheath.   ?Completed without difficulty   ?Pain immediately resolved suggesting accurate placement of the medication.   ?Advised to call if fevers/chills, erythema, induration, drainage, or persistent bleeding.   ?Images permanently stored and available for review in the ultrasound unit.  ?Impression: Technically successful ultrasound guided injection. ? ? ? ? ? ? ? ? ?Assessment and Plan: ?41 y.o. male with left hand trigger finger and third digit.  Plan to treat with injection and double Band-Aid splint.  Also recommend Voltaren gel.  Recheck back as needed. ? ? ?PDMP not reviewed this encounter. ?Orders Placed This Encounter  ?Procedures  ? [place] LIMITED JOINT SPACE STRUCTURES UP LEFT(NO LINKED CHARGES)  ?  Standing Status:   Future  ?  Number of Occurrences:   1  ?  Standing Expiration Date:   06/21/2022  ?  Order Specific Question:   Reason for Exam (SYMPTOM  OR DIAGNOSIS REQUIRED)  ?  Answer:   left hand pain  ?  Order Specific Question:   Preferred imaging location?  ?  Answer:   [place]  ? ?No orders of the defined types were placed in this encounter. ? ? ? ?Discussed warning signs or symptoms. Please see discharge instructions. Patient expresses understanding. ? ? ?The above documentation has been reviewed and is accurate and complete [name], M.D. ? ? ?

## 2021-12-21 NOTE — Telephone Encounter (Signed)
Auth Submission: APPROVED ?Payer: [employer] ?Medication & CPT/J Code(s) submitted: Entyvio (Vedolizumab) [id number] ?Route of submission (phone, fax, portal): FAX: [phone] ?PHONE: [phone] ?Auth type: Buy/Bill ?Units/visits requested: Q8WKS ?Reference number: [id number] ?EOC# [id number] ?Approval from: 12/13/21 to 12/13/22  ? ?Patient will be scheduled as soon as possible

## 2021-12-25 ENCOUNTER — Ambulatory Visit (INDEPENDENT_AMBULATORY_CARE_PROVIDER_SITE_OTHER): Payer: Managed Care, Other (non HMO)

## 2021-12-25 ENCOUNTER — Other Ambulatory Visit: Payer: Self-pay

## 2021-12-25 VITALS — BP 136/87 | HR 78 | Temp 98.5°F | Resp 20 | Ht 66.0 in | Wt 222.0 lb

## 2021-12-25 DIAGNOSIS — K5100 Ulcerative (chronic) pancolitis without complications: Secondary | ICD-10-CM

## 2021-12-25 MED ORDER — VEDOLIZUMAB 300 MG IV SOLR
300.0000 mg | Freq: Once | INTRAVENOUS | Status: AC
  Administered 2021-12-25: 300 mg via INTRAVENOUS
  Filled 2021-12-25: qty 5

## 2021-12-25 MED ORDER — ALBUTEROL SULFATE HFA 108 (90 BASE) MCG/ACT IN AERS: 2.0000 | INHALATION_SPRAY | Freq: Once | RESPIRATORY_TRACT | Status: DC | PRN

## 2021-12-25 MED ORDER — FAMOTIDINE IN NACL 20-0.9 MG/50ML-% IV SOLN: 20.0000 mg | Freq: Once | INTRAVENOUS | Status: DC | PRN

## 2021-12-25 MED ORDER — METHYLPREDNISOLONE SODIUM SUCC 125 MG IJ SOLR: 125.0000 mg | Freq: Once | INTRAMUSCULAR | Status: DC | PRN

## 2021-12-25 MED ORDER — DIPHENHYDRAMINE HCL 50 MG/ML IJ SOLN: 50.0000 mg | Freq: Once | INTRAMUSCULAR | Status: DC | PRN

## 2021-12-25 MED ORDER — SODIUM CHLORIDE 0.9 % IV SOLN: Freq: Once | INTRAVENOUS | Status: DC | PRN

## 2021-12-25 MED ORDER — EPINEPHRINE 0.3 MG/0.3ML IJ SOAJ: 0.3000 mg | Freq: Once | INTRAMUSCULAR | Status: DC | PRN

## 2021-12-25 NOTE — Progress Notes (Signed)
Diagnosis: Crohn's Disease ? ?Provider:  Marshell Garfinkel, MD ? ?Procedure: Infusion ? ?IV Type: Peripheral, IV Location: R Forearm ? ?Entyvio (Vedolizumab), Dose: 300 mg ? ?Infusion Start Time: 8921 ? ?Infusion Stop Time: 1941 ? ?Post Infusion IV Care: Peripheral IV Discontinued ? ?Discharge: Condition: Good, Destination: Home . AVS provided to patient.  ? ?Performed by:  Koren Shiver, RN  ?  ?

## 2022-02-19 ENCOUNTER — Ambulatory Visit (INDEPENDENT_AMBULATORY_CARE_PROVIDER_SITE_OTHER): Payer: Managed Care, Other (non HMO)

## 2022-02-19 VITALS — BP 138/76 | HR 78 | Temp 98.1°F | Resp 18 | Ht 65.0 in | Wt 229.0 lb

## 2022-02-19 DIAGNOSIS — K5100 Ulcerative (chronic) pancolitis without complications: Secondary | ICD-10-CM | POA: Diagnosis not present

## 2022-02-19 MED ORDER — VEDOLIZUMAB 300 MG IV SOLR
300.0000 mg | Freq: Once | INTRAVENOUS | Status: AC
  Administered 2022-02-19: 300 mg via INTRAVENOUS
  Filled 2022-02-19: qty 5

## 2022-02-19 NOTE — Progress Notes (Signed)
Diagnosis: Crohn's Disease ? ?Provider:  Marshell Garfinkel, MD ? ?Procedure: Infusion ? ?IV Type: Peripheral, IV Location: L Hand ? ?Entyvio (Vedolizumab),  Dose: 300 mg ? ?Infusion Start Time: 9359 ? ?Infusion Stop Time: 4090 ? ?Post Infusion IV Care: Peripheral IV Discontinued ? ?Discharge: Condition: Good, Destination: Home . AVS provided to patient.  ? ?Performed by:  Cleophus Molt, RN  ?  ?

## 2022-04-16 ENCOUNTER — Ambulatory Visit (INDEPENDENT_AMBULATORY_CARE_PROVIDER_SITE_OTHER): Payer: Managed Care, Other (non HMO)

## 2022-04-16 VITALS — BP 115/72 | HR 85 | Temp 98.3°F | Resp 18 | Ht 66.0 in | Wt 223.0 lb

## 2022-04-16 DIAGNOSIS — K5100 Ulcerative (chronic) pancolitis without complications: Secondary | ICD-10-CM | POA: Diagnosis not present

## 2022-04-16 MED ORDER — VEDOLIZUMAB 300 MG IV SOLR
300.0000 mg | Freq: Once | INTRAVENOUS | Status: AC
  Administered 2022-04-16: 300 mg via INTRAVENOUS
  Filled 2022-04-16: qty 5

## 2022-04-16 NOTE — Progress Notes (Signed)
Diagnosis: Crohn's Disease  Provider:  Marshell Garfinkel, MD  Procedure: Infusion  IV Type: Peripheral, IV Location: L Hand  Entyvio (Vedolizumab), Dose: 300 mg  Infusion Start Time: 1683  Infusion Stop Time: 7290  Post Infusion IV Care: Peripheral IV Discontinued  Discharge: Condition: Good, Destination: Home . AVS provided to patient.   Performed by:  Arnoldo Morale, RN

## 2022-06-11 ENCOUNTER — Ambulatory Visit: Payer: Managed Care, Other (non HMO)

## 2022-06-11 VITALS — BP 118/70 | HR 72 | Temp 98.1°F | Resp 18 | Ht 65.0 in | Wt 231.8 lb

## 2022-06-11 DIAGNOSIS — K5100 Ulcerative (chronic) pancolitis without complications: Secondary | ICD-10-CM | POA: Diagnosis not present

## 2022-06-11 MED ORDER — VEDOLIZUMAB 300 MG IV SOLR
300.0000 mg | Freq: Once | INTRAVENOUS | Status: AC
  Administered 2022-06-11: 300 mg via INTRAVENOUS
  Filled 2022-06-11: qty 5

## 2022-06-11 NOTE — Progress Notes (Signed)
Diagnosis: Crohn's Disease  Provider:  Marshell Garfinkel MD  Procedure: Infusion  IV Type: Peripheral, IV Location: L Hand  Entyvio (Vedolizumab), Dose: 300 mg  Infusion Start Time: 6789  Infusion Stop Time: 1509  Post Infusion IV Care: Peripheral IV Discontinued  Discharge: Condition: Good, Destination: Home . AVS provided to patient.   Performed by:  Adelina Mings, LPN

## 2022-07-08 NOTE — Progress Notes (Unsigned)
   I, Peterson Lombard, LAT, ATC acting as a scribe for [name], MD.  John [name] is a 41 y.o. male who presents to [place] at [facility] today for cont'd L hand finger triggering. 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 Pt was last seen by Dr. Georgina Snell on 12/19/21 for his L 3rd finger and was given a L 3rd MCP steroid injection and was advised to use Voltaren gel and double Band-aid splint. Today, pt reports   Pertinent review of systems: ***  Relevant historical information: ***   Exam:  There were no vitals taken for this visit. General: Well Developed, well nourished, and in no acute distress.   MSK: ***    Lab and Radiology Results No results found for this or any previous visit (from the past 72 hour(s)). No results found.     Assessment and Plan: 41 y.o. male with ***   PDMP not reviewed this encounter. No orders of the defined types were placed in this encounter.  No orders of the defined types were placed in this encounter.    Discussed warning signs or symptoms. Please see discharge instructions. Patient expresses understanding.   ***

## 2022-07-09 ENCOUNTER — Ambulatory Visit: Payer: Self-pay

## 2022-07-09 ENCOUNTER — Ambulatory Visit: Payer: Managed Care, Other (non HMO) | Admitting: Family Medicine

## 2022-07-09 VITALS — BP 142/88 | HR 75 | Ht 65.0 in | Wt 237.0 lb

## 2022-07-09 DIAGNOSIS — M65332 Trigger finger, left middle finger: Secondary | ICD-10-CM | POA: Diagnosis not present

## 2022-07-09 DIAGNOSIS — M79642 Pain in left hand: Secondary | ICD-10-CM

## 2022-07-09 DIAGNOSIS — M65322 Trigger finger, left index finger: Secondary | ICD-10-CM | POA: Diagnosis not present

## 2022-07-09 DIAGNOSIS — M65342 Trigger finger, left ring finger: Secondary | ICD-10-CM

## 2022-07-09 DIAGNOSIS — M65312 Trigger thumb, left thumb: Secondary | ICD-10-CM | POA: Diagnosis not present

## 2022-07-09 DIAGNOSIS — M65352 Trigger finger, left little finger: Secondary | ICD-10-CM

## 2022-07-09 NOTE — Patient Instructions (Addendum)
Thank you for coming in today.   You received an injection today. Seek immediate medical attention if the joint becomes red, extremely painful, or is oozing fluid.   Use the double Band-aid splint  Take it easy with your hand today.

## 2022-08-06 ENCOUNTER — Ambulatory Visit: Payer: Managed Care, Other (non HMO)

## 2022-08-06 VITALS — BP 147/81 | HR 74 | Temp 98.4°F | Resp 18 | Ht 65.0 in | Wt 234.4 lb

## 2022-08-06 DIAGNOSIS — K5100 Ulcerative (chronic) pancolitis without complications: Secondary | ICD-10-CM

## 2022-08-06 MED ORDER — VEDOLIZUMAB 300 MG IV SOLR
300.0000 mg | Freq: Once | INTRAVENOUS | Status: AC
  Administered 2022-08-06: 300 mg via INTRAVENOUS
  Filled 2022-08-06: qty 5

## 2022-08-06 NOTE — Progress Notes (Signed)
Diagnosis: Ulcerative Colitis  Provider:  Marshell Garfinkel MD  Procedure: Infusion  IV Type: Peripheral, IV Location: L Antecubital  Entyvio (Vedolizumab), Dose: 300 mg  Infusion Start Time: 5996  Infusion Stop Time: 1540  Post Infusion IV Care: Peripheral IV Discontinued  Discharge: Condition: Good, Destination: Home . AVS provided to patient.   Performed by:  Adelina Mings, LPN

## 2022-10-01 ENCOUNTER — Ambulatory Visit: Payer: Managed Care, Other (non HMO)

## 2022-10-01 VITALS — BP 139/75 | HR 72 | Temp 98.5°F | Resp 18 | Ht 65.0 in | Wt 235.8 lb

## 2022-10-01 DIAGNOSIS — K5100 Ulcerative (chronic) pancolitis without complications: Secondary | ICD-10-CM

## 2022-10-01 MED ORDER — VEDOLIZUMAB 300 MG IV SOLR
300.0000 mg | Freq: Once | INTRAVENOUS | Status: AC
  Administered 2022-10-01: 300 mg via INTRAVENOUS
  Filled 2022-10-01: qty 5

## 2022-10-01 NOTE — Progress Notes (Signed)
Diagnosis: Ulcerative Colitis  Provider:  Marshell Garfinkel MD  Procedure: Infusion  IV Type: Peripheral, IV Location: L Hand  Entyvio (Vedolizumab), Dose: 300 mg  Infusion Start Time: 0569  Infusion Stop Time: 7948  Post Infusion IV Care: Peripheral IV Discontinued  Discharge: Condition: Good, Destination: Home . AVS provided to patient.   Performed by:  Sabitri Javin Nong,RN

## 2022-11-22 ENCOUNTER — Other Ambulatory Visit (INDEPENDENT_AMBULATORY_CARE_PROVIDER_SITE_OTHER): Payer: Managed Care, Other (non HMO)

## 2022-11-22 ENCOUNTER — Encounter: Payer: Self-pay | Admitting: Internal Medicine

## 2022-11-22 ENCOUNTER — Ambulatory Visit: Payer: Managed Care, Other (non HMO) | Admitting: Internal Medicine

## 2022-11-22 VITALS — BP 134/80 | HR 85 | Ht 65.0 in | Wt 236.0 lb

## 2022-11-22 DIAGNOSIS — E559 Vitamin D deficiency, unspecified: Secondary | ICD-10-CM

## 2022-11-22 DIAGNOSIS — R748 Abnormal levels of other serum enzymes: Secondary | ICD-10-CM

## 2022-11-22 DIAGNOSIS — K5100 Ulcerative (chronic) pancolitis without complications: Secondary | ICD-10-CM | POA: Diagnosis not present

## 2022-11-22 DIAGNOSIS — K760 Fatty (change of) liver, not elsewhere classified: Secondary | ICD-10-CM

## 2022-11-22 DIAGNOSIS — Z7960 Long term (current) use of unspecified immunomodulators and immunosuppressants: Secondary | ICD-10-CM | POA: Diagnosis not present

## 2022-11-22 LAB — HEPATIC FUNCTION PANEL
ALT: 53 U/L (ref 0–53)
AST: 42 U/L — ABNORMAL HIGH (ref 0–37)
Albumin: 4.7 g/dL (ref 3.5–5.2)
Alkaline Phosphatase: 56 U/L (ref 39–117)
Bilirubin, Direct: 0.1 mg/dL (ref 0.0–0.3)
Total Bilirubin: 0.7 mg/dL (ref 0.2–1.2)
Total Protein: 8.3 g/dL (ref 6.0–8.3)

## 2022-11-22 LAB — FERRITIN: Ferritin: 168.9 ng/mL (ref 22.0–322.0)

## 2022-11-22 NOTE — Progress Notes (Signed)
John [name] 42 y.o. 05-Sep-1981 [id number]  Assessment & Plan:   Encounter Diagnoses  Name Primary?   Ulcerative pancolitis without complication (HCC) Yes   Long-term use of immunosuppressant medication - Entyvio    Abnormal transaminases    NAFLD (nonalcoholic fatty liver disease)    Vitamin D deficiency     Clinically in remission with ulcerative colitis on intravenous Entyvio.  I will look into transitioning to subcutaneous Entyvio.  He does run the risk of deterioration with the change but is interested in pursuing this.  Abnormal transaminases probably fatty liver disease.  Recheck LFTs.  We had a discussion about reducing sugary beverages (soda and sweet tea) and trying to eliminate those.  Also handout given with some instructions on eating a more low carbohydrate diet.  He has the classic situation of abdominal obesity he has high triglycerides and low HDL and I think would do better on a low carbohydrate diet.  Vitamin D level is 25.  I will discuss an 8-week course of 50,000 units weekly after I review labs that were ordered.  Colonoscopy planned for later this year it will be 8 years from the time of diagnosis.  June 2024 recall.  Orders Placed This Encounter  Procedures   Hepatic function panel   QuantiFERON-TB Gold Plus   Ferritin    CC: John Margarita, DO    Subjective:  Gastroenterology summary:  Universal ulcerative colitis Dx 06/2015 - prednisone Tx failed -had perianal abscess hospitalized and started Remicade 08/02/15 Remicade infusion reaction Will change to Humira has done well on Humira 2017 anal fissure Hospitalized June 2019 with severe flare Humira stopped IV steroids, Cort enema, then 6-MP plus Entyvio begun 6-MP stopped due to intolerance side effects 04/2020 colonoscopy - inflammatory polyps/pseudopolyps patchy inactive colitis   Family history of colon cancer father and paternal grandfather (father at age 73)  NAFLD-ultrasound 2016  C.  difficile colitis with recurrence 2017  GERD  Iron deficiency anemia attributed to ulcerative colitis and bleeding Resolved  Chief Complaint: Follow-up of ulcerative colitis  HPI 42 year old white man with a history of ulcerative colitis diagnosed in 2016, clinical course as outlined above in the summary, who presents feeling well on Entyvio 300 mg every 8 weeks.  He is curious about switching to subcutaneous Entyvio.  I have primary care notes from a visit of 09/24/2022.  He has had abnormal ALT in the low to mid 40 range at x 43 in November of 2023 and 44 in 2022.  Top-normal is 40.  A ST 33 and 36 respectively.  CBC normal in November 2023.  The other electrolytes and chemistries were normal as well.  Ferritin was 150 November of last year TSH 3.72 triglycerides 154 HDL 28 glucose 91 vitamin D was 25.2 Allergies  Allergen Reactions   Amoxicillin Hives    Hives to trunk and arms Has patient had a PCN reaction causing immediate rash, facial/tongue/throat swelling, SOB or lightheadedness with hypotension: Yes Has patient had a PCN reaction causing severe rash involving mucus membranes or skin necrosis: No Has patient had a PCN reaction that required hospitalization: No Has patient had a PCN reaction occurring within the last 10 years: Yes If all of the above answers are "NO", then may proceed with Cephalosporin use.   Remicade [Infliximab] Anaphylaxis   Clindamycin Other (See Comments)    c-diff   Lialda [Mesalamine] Other (See Comments)    Worsening of rectal bleeding   Current Meds  Medication Sig  calcium carbonate (TUMS) 500 MG chewable tablet Chew 1 tablet (200 mg of elemental calcium total) by mouth 3 (three) times daily.   Cholecalciferol (VITAMIN D) 2000 units CAPS Take 1 capsule by mouth daily.   ferrous sulfate 325 (65 FE) MG tablet Take 1 tablet by mouth daily.   loratadine (CLARITIN) 10 MG tablet Take 10 mg by mouth daily as needed for allergies.   Multiple  Vitamins-Minerals (MULTIVITAMIN PO) Take 1 tablet by mouth daily.    vedolizumab (ENTYVIO) 300 MG injection Inject into the vein.   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   Recurrent colitis due to Clostridium difficile    Sleep apnea    UC (ulcerative colitis) ([place])    Vitamin D deficiency 04/03/2016   Past Surgical History:  Procedure Laterality Date   BIOPSY  04/11/2018   Procedure: BIOPSY;  Surgeon: John Artist, MD;  Location: [facility];  Service: Endoscopy;;   COLONOSCOPY N/A 04/11/2018   Procedure: COLONOSCOPY;  Surgeon: John Artist, MD;  Location: [facility];  Service: Endoscopy;  Laterality: N/A;   ESOPHAGOGASTRODUODENOSCOPY N/A 07/11/2015   Procedure: ESOPHAGOGASTRODUODENOSCOPY (EGD);  Surgeon: John Bears, MD;  Location: [facility];  Service: Gastroenterology;  Laterality: N/A;   FLEXIBLE SIGMOIDOSCOPY N/A 07/11/2015   Procedure: FLEXIBLE SIGMOIDOSCOPY;  Surgeon: John Bears, MD;  Location: [facility];  Service: Gastroenterology;  Laterality: N/A;   FLEXIBLE SIGMOIDOSCOPY N/A 04/17/2018   Procedure: FLEXIBLE SIGMOIDOSCOPY;  Surgeon: John Mayer, MD;  Location: [facility];  Service: Endoscopy;  Laterality: N/A;   HIP SURGERY Left    INCISION AND DRAINAGE PERIRECTAL ABSCESS N/A 07/24/2015   Procedure: IRRIGATION AND DEBRIDEMENT PERIRECTAL ABSCESS;  Surgeon: John Overall, MD;  Location: [facility];  Service: General;  Laterality: N/A;   KNEE ARTHROSCOPY Left    TYMPANOSTOMY TUBE PLACEMENT Bilateral    Social History   Social History Narrative   Married John [name] CMA [facility]), 1 daughter born  2014 1 son born 2016   [employer]   Enjoys softball   family history includes Colon cancer in his paternal grandfather; Colon cancer (age of onset: 47) in his father; Diabetes in his father; Heart disease in his father, maternal grandfather, maternal grandmother, paternal grandfather, and paternal grandmother; Hyperlipidemia in his father and mother; Hypertension in his brother and mother; Mental illness in his maternal grandfather.   Review of Systems As per HPI  Objective:   Physical Exam '@BP'$  134/80   Pulse 85   Ht '5\' 5"'$  (1.651 m)   Wt 236 lb (107 kg)   BMI 39.27 kg/m @  General:  NAD Eyes:   anicteric Lungs:  clear Heart::  S1S2 no rubs, murmurs or gallops Abdomen:  soft and nontender, BS+, it protuberant and obese (abdominal obesity)     Data Reviewed:  See HPI

## 2022-11-22 NOTE — Patient Instructions (Signed)

## 2022-11-24 LAB — QUANTIFERON-TB GOLD PLUS
Mitogen-NIL: >10 IU/mL
NIL: 0.03 IU/mL
QuantiFERON-TB Gold Plus: NEGATIVE
TB1-NIL: 0.02 IU/mL
TB2-NIL: 0.02 IU/mL

## 2022-11-26 ENCOUNTER — Ambulatory Visit: Payer: Managed Care, Other (non HMO)

## 2022-11-26 VITALS — BP 126/75 | HR 75 | Temp 98.4°F | Resp 18 | Ht 65.0 in | Wt 237.0 lb

## 2022-11-26 DIAGNOSIS — K5100 Ulcerative (chronic) pancolitis without complications: Secondary | ICD-10-CM | POA: Diagnosis not present

## 2022-11-26 MED ORDER — VEDOLIZUMAB 300 MG IV SOLR
300.0000 mg | Freq: Once | INTRAVENOUS | Status: AC
  Administered 2022-11-26: 300 mg via INTRAVENOUS
  Filled 2022-11-26: qty 5

## 2022-11-26 NOTE — Progress Notes (Signed)
Diagnosis: Crohn's Disease  Provider:  Marshell Garfinkel MD  Procedure: Infusion  IV Type: Peripheral, IV Location: R Antecubital  Entyvio (Vedolizumab), Dose: 300 mg  Infusion Start Time: 6568  Infusion Stop Time: 1275  Post Infusion IV Care: Peripheral IV Discontinued  Discharge: Condition: Good, Destination: Home . AVS Declined  Performed by:  Adelina Mings, LPN

## 2022-11-28 ENCOUNTER — Other Ambulatory Visit: Payer: Self-pay | Admitting: Pharmacy Technician

## 2022-12-09 ENCOUNTER — Telehealth: Payer: Self-pay | Admitting: Pharmacy Technician

## 2022-12-09 NOTE — Telephone Encounter (Addendum)
PA RENEWAL:  Auth Submission: APPROVED Payer: [employer] Medication & CPT/J Code(s) submitted: Entyvio (Vedolizumab) [id number] Route of submission (phone, fax, portal):  Phone # [phone] Fax # Auth type: Buy/Bill Units/visits requested: 6 DOSES (2100 UNITS) Reference number: [id number] Approval from: 11/26/22 to 12/02/23  Rep: Trish Mage  Retro auth approved 11/26/22 - 12/02/23 Servicing provider updated: [place] updated fax.

## 2023-01-21 ENCOUNTER — Ambulatory Visit: Payer: Managed Care, Other (non HMO)

## 2023-01-21 VITALS — BP 127/75 | HR 69 | Temp 98.4°F | Resp 18 | Ht 65.0 in | Wt 236.6 lb

## 2023-01-21 DIAGNOSIS — K5100 Ulcerative (chronic) pancolitis without complications: Secondary | ICD-10-CM

## 2023-01-21 MED ORDER — VEDOLIZUMAB 300 MG IV SOLR
300.0000 mg | Freq: Once | INTRAVENOUS | Status: AC
  Administered 2023-01-21: 300 mg via INTRAVENOUS
  Filled 2023-01-21: qty 5

## 2023-01-21 NOTE — Progress Notes (Signed)
Diagnosis: Crohn's Disease  Provider:  Marshell Garfinkel MD  Procedure: Infusion  IV Type: Peripheral, IV Location: L Hand  Entyvio (Vedolizumab), Dose: 300 mg  Infusion Start Time: [id number]  Infusion Stop Time: [id number]  Post Infusion IV Care: Peripheral IV Discontinued  Discharge: Condition: Good, Destination: Home . AVS Declined  Performed by:  Adelina Mings, LPN

## 2023-03-18 ENCOUNTER — Ambulatory Visit: Payer: Managed Care, Other (non HMO)

## 2023-03-18 VITALS — BP 134/78 | HR 71 | Temp 98.1°F | Resp 18 | Ht 65.0 in | Wt 235.8 lb

## 2023-03-18 DIAGNOSIS — K5100 Ulcerative (chronic) pancolitis without complications: Secondary | ICD-10-CM | POA: Diagnosis not present

## 2023-03-18 MED ORDER — VEDOLIZUMAB 300 MG IV SOLR
300.0000 mg | Freq: Once | INTRAVENOUS | Status: AC
  Administered 2023-03-18: 300 mg via INTRAVENOUS
  Filled 2023-03-18: qty 5

## 2023-03-18 NOTE — Progress Notes (Signed)
Diagnosis: Ulcerative Colitis  Provider:  Chilton Greathouse MD  Procedure: IV Infusion  IV Type: Peripheral, IV Location: L Hand  Entyvio (Vedolizumab), Dose: 300 mg  Infusion Start Time: 1443  Infusion Stop Time: 1517  Post Infusion IV Care: Peripheral IV Discontinued  Discharge: Condition: Good, Destination: Home . AVS Declined  Performed by:  Adriana Mccallum, RN

## 2023-03-20 ENCOUNTER — Ambulatory Visit: Payer: Managed Care, Other (non HMO) | Admitting: Family Medicine

## 2023-03-31 ENCOUNTER — Other Ambulatory Visit: Payer: Self-pay

## 2023-03-31 ENCOUNTER — Ambulatory Visit: Payer: Managed Care, Other (non HMO) | Admitting: Family Medicine

## 2023-03-31 VITALS — BP 130/84 | HR 83 | Ht 65.0 in | Wt 233.0 lb

## 2023-03-31 DIAGNOSIS — G5601 Carpal tunnel syndrome, right upper limb: Secondary | ICD-10-CM | POA: Diagnosis not present

## 2023-03-31 DIAGNOSIS — M65332 Trigger finger, left middle finger: Secondary | ICD-10-CM | POA: Diagnosis not present

## 2023-03-31 DIAGNOSIS — M79642 Pain in left hand: Secondary | ICD-10-CM

## 2023-03-31 NOTE — Patient Instructions (Addendum)
Thank you for coming in today.   You received an injection today. Seek immediate medical attention if the joint becomes red, extremely painful, or is oozing fluid.   Continue wearing the carpal tunnel wrist brace  Check back as needed

## 2023-03-31 NOTE — Progress Notes (Unsigned)
Rubin Payor, PhD, LAT, ATC acting as a scribe for Clementeen Graham, MD.  John [name] is a 42 y.o. male who presents to [facility] at [facility] today for bilateral hand paresthesias.  Patient was previously seen by Dr. Denyse Amass on 07/09/2022 for triggering on the fingers of his left hand.  He was also seen in December of 2021 for bilateral carpal tunnel syndrome and was given bilateral carpal tunnel steroid injections. 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   Today, patient reports R 3rd finger triggering x 3-4 wks. He thinks playing golf exacerbated his pain/triggering. He also c/o R wrist pain and paresthesia in the 3rd-4th fingers.  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   MSK: Left hand normal appearing. Triggering present with flexion of third PIP.  Tender palpation palmar aspect third MCP.  Right wrist normal. Nontender. Positive Tinel's at carpal tunnel.    Lab and Radiology Results  Procedure: Real-time Ultrasound Guided Injection of left hand third MCP tendon sheath at A1 pulley (trigger finger injection) Device: Philips Affiniti 50G Images permanently stored and available for review in PACS Verbal informed consent obtained.  Discussed risks and benefits of procedure. Warned about infection, bleeding, hyperglycemia damage to structures among others. Patient expresses understanding and agreement Time-out conducted.   Noted no overlying erythema, induration, or other signs of local infection.   Skin prepped in a sterile fashion.   Local anesthesia: Topical Ethyl chloride.   With sterile technique and under real time  ultrasound guidance: 40 mg of Kenalog and 1 mL of lidocaine injected into tendon sheath. Fluid seen entering the tendon sheath.   Completed without difficulty   Pain immediately resolved suggesting accurate placement of the medication.   Advised to call if fevers/chills, erythema, induration, drainage, or persistent bleeding.   Images permanently stored and available for review in the ultrasound unit.  Impression: Technically successful ultrasound guided injection.    Procedure: Real-time Ultrasound Guided median nerve hydrodissection at right carpal tunnel Device: Philips Affiniti 50G Images permanently stored and available for review in PACS Verbal informed consent obtained.  Discussed risks and benefits of procedure. Warned about infection, bleeding, hyperglycemia damage to structures among others. Patient expresses understanding and agreement Time-out conducted.   Noted no overlying erythema, induration, or other signs of local infection.   Skin prepped in a sterile fashion.   Local anesthesia: Topical Ethyl chloride.   With sterile technique and under real time ultrasound guidance: 40 mg of Kenalog and 2 mL of lidocaine injected into carpal tunnel around median nerve. Fluid seen entering the carpal tunnel.   Completed without difficulty   Pain immediately resolved suggesting accurate placement of the medication.   Advised to call if fevers/chills, erythema, induration, drainage, or persistent bleeding.   Images permanently stored and available for review in the ultrasound unit.  Impression: Technically successful ultrasound guided injection.         Assessment and Plan: 42 y.o. male with left trigger finger third digit and right carpal tunnel syndrome.  These are recurrence of similar problems.  He does have a  little bit of left carpal tunnel syndrome as well today.  But not bad enough to inject.  Plan for double Band-Aid splint for trigger finger and carpal tunnel wrist brace  for carpal tunnel.  If the carpal tunnel does not improve sufficiently could proceed with nerve conduction study for surgical planning.  Additionally if the trigger finger does not get better we could refer him to hand surgery.   PDMP not reviewed this encounter. Orders Placed This Encounter  Procedures   [place] LIMITED JOINT SPACE STRUCTURES UP RIGHT(NO LINKED CHARGES)    Order Specific Question:   Reason for Exam (SYMPTOM  OR DIAGNOSIS REQUIRED)    Answer:   right wrist pain    Order Specific Question:   Preferred imaging location?    Answer:   [facility]   No orders of the defined types were placed in this encounter.    Discussed warning signs or symptoms. Please see discharge instructions. Patient expresses understanding.   The above documentation has been reviewed and is accurate and complete Clementeen Graham, M.D.

## 2023-05-13 ENCOUNTER — Ambulatory Visit: Payer: Managed Care, Other (non HMO)

## 2023-05-13 VITALS — BP 126/73 | HR 70 | Temp 98.0°F | Resp 16 | Ht 65.0 in | Wt 225.2 lb

## 2023-05-13 DIAGNOSIS — K5100 Ulcerative (chronic) pancolitis without complications: Secondary | ICD-10-CM

## 2023-05-13 MED ORDER — VEDOLIZUMAB 300 MG IV SOLR
300.0000 mg | Freq: Once | INTRAVENOUS | Status: AC
  Administered 2023-05-13: 300 mg via INTRAVENOUS
  Filled 2023-05-13: qty 5

## 2023-05-13 NOTE — Progress Notes (Signed)
Diagnosis: Ulcerative Colitis  Provider:  Chilton Greathouse MD  Procedure: IV Infusion  IV Type: Peripheral, IV Location: L wrist  Entyvio (Vedolizumab), Dose: 300 mg  Infusion Start Time: 1455  Infusion Stop Time: 1528  Post Infusion IV Care: Patient declined observation and Peripheral IV Discontinued  Discharge: Condition: Stable, Destination: Home . AVS Declined  Performed by:  Wyvonne Lenz, RN

## 2023-06-19 ENCOUNTER — Ambulatory Visit: Payer: Managed Care, Other (non HMO) | Admitting: Family Medicine

## 2023-06-19 ENCOUNTER — Other Ambulatory Visit: Payer: Self-pay

## 2023-06-19 ENCOUNTER — Encounter: Payer: Self-pay | Admitting: Family Medicine

## 2023-06-19 VITALS — BP 172/88 | HR 74 | Ht 65.0 in | Wt 224.0 lb

## 2023-06-19 DIAGNOSIS — M79672 Pain in left foot: Secondary | ICD-10-CM | POA: Diagnosis not present

## 2023-06-19 DIAGNOSIS — G8929 Other chronic pain: Secondary | ICD-10-CM | POA: Diagnosis not present

## 2023-06-19 DIAGNOSIS — M722 Plantar fascial fibromatosis: Secondary | ICD-10-CM

## 2023-06-19 NOTE — Patient Instructions (Addendum)
Thank you for coming in today.   Please complete the exercises that the athletic trainer went over with you:  View at [email or web address]-exercise-code.com using code: [id number]  Heel cushion or gel heel cup  Ice massage  Continue the night splint  If not better, I can do a shot.

## 2023-06-19 NOTE — Progress Notes (Signed)
   Rubin Payor, PhD, LAT, ATC acting as a scribe for Clementeen Graham, MD.  John [name] is a 42 y.o. male who presents to [facility] at [facility] today for L heel pain. Pt was previously seen by Dr. Denyse Amass on 03/31/23 for L trigger finger.  Today, pt c/o L heel pain ongoing intermittently over the last couple months. 5-6 years ago he fell out of a tree. He's been seen previously for this at [facility]. Pain is worse 1st thing in the morning and w/ increased activity. Pt locates pain to the medial-plantar aspect of his L calcaneous.  Aggravates: first thing in the morning Treatments tried: arch strap splint, Voltaren gel, rolling it over a tennis ball, stretching  Pertinent review of systems: No fevers or chills  Relevant historical information: Ulcerative colitis and sleep apnea.   Exam:  BP (!) 172/88   Pulse 74   Ht 5\' 5"  (1.651 m)   Wt 224 lb (101.6 kg)   SpO2 96%   BMI 37.28 kg/m  General: Well Developed, well nourished, and in no acute distress.   MSK: Left foot normal  Appearing Tender palpation plantar medial calcaneus.  Normal foot and ankle motion.  Intact strength.       Assessment and Plan: 42 y.o. male with left plantar foot pain thought to be due to plantar fasciitis.  Plan to maximize conservative management with eccentric exercises heel cushion night splints and ice massage or ice immersion.  If not improving in 1 month consider steroid injection.  Check back as needed.   PDMP not reviewed this encounter. No orders of the defined types were placed in this encounter.  No orders of the defined types were placed in this encounter.    Discussed warning signs or symptoms. Please see discharge instructions. Patient expresses understanding.   The above documentation has been reviewed and is accurate and complete Clementeen Graham, M.D.

## 2023-06-26 ENCOUNTER — Encounter: Payer: Self-pay | Admitting: Dermatology

## 2023-06-30 ENCOUNTER — Encounter: Payer: Self-pay | Admitting: Dermatology

## 2023-06-30 ENCOUNTER — Ambulatory Visit: Payer: Managed Care, Other (non HMO) | Admitting: Dermatology

## 2023-06-30 VITALS — BP 132/78 | HR 60

## 2023-06-30 DIAGNOSIS — L729 Follicular cyst of the skin and subcutaneous tissue, unspecified: Secondary | ICD-10-CM | POA: Diagnosis not present

## 2023-06-30 DIAGNOSIS — D509 Iron deficiency anemia, unspecified: Secondary | ICD-10-CM | POA: Insufficient documentation

## 2023-06-30 DIAGNOSIS — R030 Elevated blood-pressure reading, without diagnosis of hypertension: Secondary | ICD-10-CM | POA: Insufficient documentation

## 2023-06-30 DIAGNOSIS — R053 Chronic cough: Secondary | ICD-10-CM | POA: Insufficient documentation

## 2023-06-30 DIAGNOSIS — N529 Male erectile dysfunction, unspecified: Secondary | ICD-10-CM | POA: Insufficient documentation

## 2023-06-30 DIAGNOSIS — F321 Major depressive disorder, single episode, moderate: Secondary | ICD-10-CM | POA: Insufficient documentation

## 2023-06-30 DIAGNOSIS — E291 Testicular hypofunction: Secondary | ICD-10-CM | POA: Insufficient documentation

## 2023-06-30 DIAGNOSIS — Z0000 Encounter for general adult medical examination without abnormal findings: Secondary | ICD-10-CM | POA: Insufficient documentation

## 2023-06-30 DIAGNOSIS — E781 Pure hyperglyceridemia: Secondary | ICD-10-CM | POA: Insufficient documentation

## 2023-06-30 NOTE — Progress Notes (Signed)
   New Patient Visit   Subjective  John [name] is a 42 y.o. male who presents for the following: Cyst  Patient states he  has cyst located at the right shoulder that he  would like to have examined. Patient reports the areas have been there for 15-20 years. He reports the areas are not bothersome. Patient rates irritation 0 out of 10. He states that the areas have not spread. Patient reports he  has not previously been treated for these areas. Patient denies Hx of bx. Patient reports family history of skin cancer(s)(Dad SCC and BCC).  The following portions of the chart were reviewed this encounter and updated as appropriate: medications, allergies, medical history  Review of Systems:  No other skin or systemic complaints except as noted in HPI or Assessment and Plan.  Objective  Well appearing patient in no apparent distress; mood and affect are within normal limits.  A focused examination was performed of the following areas: Right Shoulder  Relevant exam findings are noted in the Assessment and Plan.       Assessment & Plan   CYST Exam: Superior Shoulder 18 mm firm Subcutaneous nodule 1. Subcutaneous Cystic Nodule on Right Posterior Shoulder    - Plan: Schedule surgical excision with Dr. Caralyn Guile in the office setting, utilizing local anesthesia with lidocaine. Provide the patient with preoperative instructions, which include avoiding Advil, Aleve, Aspirin, and alcohol. Arrange a follow-up in two weeks for suture removal and wound assessment.  2. Risk of Keloid Formation Post-Surgery    - Plan: Educate the patient about the signs of keloid formation and emphasize the importance of early intervention. Advise the patient to return to the clinic within two months post-surgery if a keloid begins to form, for treatment with Kenalog injection.  3. 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 Recommend postponing workouts until after the sutures are removed and the wound has healed. Schedule a follow-up appointment for suture removal and wound assessment two weeks after the surgery.  Follow-up as needed for any unresolved or worsening issues.   No follow-ups on file.    Documentation: I have reviewed the above documentation for accuracy and completeness, and I agree with the above.  Stasia Cavalier, am acting as scribe for Langston Reusing, DO.  Langston Reusing, DO

## 2023-06-30 NOTE — Patient Instructions (Addendum)
Hello John [name],  Thank you for visiting our clinic today. We appreciate your commitment to addressing your health concerns promptly. Below is a summary of the key instructions and next steps we discussed regarding the removal of your cyst:  - Surgical Excision: Scheduled with Dr. Rosanne Gutting to remove the cyst on your right posterior shoulder.    Pre-Surgery Instructions:   - Medications to Avoid: Do not take Advil, Aleve, Aspirin, and alcohol as per the guidelines provided. Specifically, avoid Advil, Aleve, and Aspirin one week before surgery; abstain from alcohol the day before surgery.    Post-Surgery Care:   - Sutures: Sutures will be placed and need to be removed two weeks after the surgery.   - Activity Restrictions: Avoid submerging in water and refrain from working out during the recovery period. 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    Monitoring for Keloids:   - Keloid Formation: If you notice a keloid forming within two months post-surgery, contact us immediately for potential treatment with Kenalog to prevent further growth.  Please ensure you have no upcoming vacations that might interfere with your surgery and post-operative care. We look forward to assisting you through this procedure and ensuring a smooth recovery.  Best regards,  Dr. Langston Reusing Dermatology Important Information  Due to recent changes in healthcare laws, you may see results of your pathology and/or laboratory studies on MyChart before the doctors have had a chance to review them. We understand that in some cases there may be results that are confusing or concerning to you. Please understand that not all results are received at the same time and often the doctors may need to interpret multiple results in order to provide you with the best plan of care or course of treatment. Therefore, we ask that you please give [place] 2 business days to thoroughly review all your  results before contacting the office for clarification. Should we see a critical lab result, you will be contacted sooner.   If You Need Anything After Your Visit  If you have any questions or concerns for your doctor, please call our main line at [phone] If no one answers, please leave a voicemail as directed and we will return your call as soon as possible. Messages left after 4 pm will be answered the following business day.   You may also send [place] a message via MyChart. We typically respond to MyChart messages within 1-2 business days.  For prescription refills, please ask your pharmacy to contact our office. Our fax number is [phone].  If you have an urgent issue when the clinic is closed that cannot wait until the next business day, you can page your doctor at the number below.    Please note that while we do our best to be available for urgent issues outside of office hours, we are not available 24/7.   If you have an urgent issue and are unable to reach [place], you may choose to seek medical care at your doctor's office, retail clinic, urgent care center, or emergency room.  If you have a medical emergency, please immediately call 911 or go to the emergency department. In the event of inclement weather, please call our main line at [phone] for an update on the status of any delays or closures.  Dermatology Medication Tips: Please keep the boxes that topical medications come in in order to help keep track of the instructions about where and how to  use these. Pharmacies typically print the medication instructions only on the boxes and not directly on the medication tubes.   If your medication is too expensive, please contact our office at [phone] or send [place] a message through MyChart.   We are unable to tell what your co-pay for medications will be in advance as this is different depending on your insurance coverage. However, we may be able to find a substitute medication  at lower cost or fill out paperwork to get insurance to cover a needed medication.   If a prior authorization is required to get your medication covered by your insurance company, please allow [place] 1-2 business days to complete this process.  Drug prices often vary depending on where the prescription is filled and some pharmacies may offer cheaper prices.  The website www.goodrx.com contains coupons for medications through different pharmacies. The prices here do not account for what the cost may be with help from insurance (it may be cheaper with your insurance), but the website can give you the price if you did not use any insurance.  - You can print the associated coupon and take it with your prescription to the pharmacy.  - You may also stop by our office during regular business hours and pick up a GoodRx coupon card.  - If you need your prescription sent electronically to a different pharmacy, notify our office through [facility] or by phone at [phone]

## 2023-07-08 ENCOUNTER — Ambulatory Visit: Payer: Managed Care, Other (non HMO)

## 2023-07-08 VITALS — BP 126/77 | HR 69 | Temp 98.4°F | Resp 16 | Ht 65.0 in | Wt 226.4 lb

## 2023-07-08 DIAGNOSIS — K5100 Ulcerative (chronic) pancolitis without complications: Secondary | ICD-10-CM

## 2023-07-08 MED ORDER — VEDOLIZUMAB 300 MG IV SOLR
300.0000 mg | Freq: Once | INTRAVENOUS | Status: AC
  Administered 2023-07-08: 300 mg via INTRAVENOUS
  Filled 2023-07-08: qty 5

## 2023-07-08 NOTE — Progress Notes (Signed)
Diagnosis: Ulcerative Colitis  Provider:  Chilton Greathouse MD  Procedure: IV Infusion  IV Type: Peripheral, IV Location: L Hand  Entyvio (Vedolizumab), Dose: 300 mg  Infusion Start Time: 1506  Infusion Stop Time: 1541  Post Infusion IV Care: Patient declined observation and Peripheral IV Discontinued  Discharge: Condition: Stable, Destination: Home . AVS Declined  Performed by:  Wyvonne Lenz, RN

## 2023-07-22 ENCOUNTER — Encounter: Payer: Self-pay | Admitting: Internal Medicine

## 2023-07-22 ENCOUNTER — Ambulatory Visit (AMBULATORY_SURGERY_CENTER): Payer: Managed Care, Other (non HMO) | Admitting: *Deleted

## 2023-07-22 VITALS — Ht 65.0 in | Wt 225.0 lb

## 2023-07-22 DIAGNOSIS — K51019 Ulcerative (chronic) pancolitis with unspecified complications: Secondary | ICD-10-CM

## 2023-07-22 NOTE — Progress Notes (Signed)
Pt's name and DOB verified at the beginning of the pre-visit.  Pt denies any difficulty with ambulating,sitting, laying down or rolling side to side Gave both [facility] main # and MD on call # prior to instructions.  No egg or soy allergy known to patient  No issues known to pt with past sedation with any surgeries or procedures Pt denies having issues being intubated Pt has no issues moving head neck or swallowing No FH of Malignant Hyperthermia Pt is not on diet pills Pt is not on home 02  Pt is not on blood thinners  Pt denies issues with constipation  Pt is not on dialysis Pt denise any abnormal heart rhythms  Pt denies any upcoming cardiac testing Pt encouraged to use to use Singlecare or Goodrx to reduce cost  Patient's chart reviewed by Cathlyn Parsons CNRA prior to pre-visit and patient appropriate for the [facility].  Pre-visit completed and red dot placed by patient's name on their procedure day (on provider's schedule).  . Visit by phone Pt states weight is 225 lb Instructed pt why it is important to and  to call if they have any changes in health or new medications. Directed them to the # given and on instructions.   Pt states they will.  Instructions reviewed with pt and pt states understanding. Instructed to review again prior to procedure. Pt states they will.  Instructions sent by mail with coupon and by my chart

## 2023-08-07 NOTE — Progress Notes (Signed)
[facility] History and Physical   Primary Care Physician:  Charlane Ferretti, DO   Reason for Procedure:  Ulcerative colitis  Plan:    Colonoscopy     HPI: John [name] is a 42 y.o. male with a history of ulcerative colitis here for surveillance colonoscopy, on Entyvio.   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   Recurrent colitis due to Clostridium difficile    Sleep apnea    UC (ulcerative colitis) (HCC)    Vitamin D deficiency 04/03/2016    Past Surgical History:  Procedure Laterality Date   BIOPSY  04/11/2018   Procedure: BIOPSY;  Surgeon: Meryl Dare, MD;  Location: [facility];  Service: Endoscopy;;   COLONOSCOPY N/A 04/11/2018   Procedure: COLONOSCOPY;  Surgeon: Meryl Dare, MD;  Location: [facility];  Service: Endoscopy;  Laterality: N/A;   ESOPHAGOGASTRODUODENOSCOPY N/A 07/11/2015   Procedure: ESOPHAGOGASTRODUODENOSCOPY (EGD);  Surgeon: Beverley Fiedler, MD;  Location: [facility];  Service: Gastroenterology;  Laterality: N/A;   FLEXIBLE SIGMOIDOSCOPY N/A 07/11/2015   Procedure: FLEXIBLE SIGMOIDOSCOPY;  Surgeon: Beverley Fiedler, MD;  Location: [facility];  Service: Gastroenterology;  Laterality: N/A;   FLEXIBLE SIGMOIDOSCOPY N/A 04/17/2018   Procedure: FLEXIBLE SIGMOIDOSCOPY;  Surgeon: Iva Boop, MD;  Location: [facility];  Service: Endoscopy;  Laterality: N/A;   HIP SURGERY Left    INCISION AND DRAINAGE PERIRECTAL ABSCESS N/A 07/24/2015   Procedure: IRRIGATION AND DEBRIDEMENT PERIRECTAL ABSCESS;  Surgeon: Ovidio Kin, MD;  Location: [facility];  Service:  General;  Laterality: N/A;   KNEE ARTHROSCOPY Left    TYMPANOSTOMY TUBE PLACEMENT Bilateral     Prior to Admission medications   Medication Sig Start Date End Date Taking? Authorizing Provider  calcium carbonate (TUMS) 500 MG chewable tablet Chew 1 tablet (200 mg of elemental calcium total) by mouth 3 (three) times daily. Patient not taking: Reported on 07/22/2023 05/08/18   Iva Boop, MD  Cholecalciferol (VITAMIN D) 2000 units CAPS Take 1 capsule by mouth daily.    [provider]  ferrous sulfate 325 (65 FE) MG tablet Take 1 tablet by mouth daily. Patient not taking: Reported on 07/22/2023    [provider]  loratadine (CLARITIN) 10 MG tablet Take 10 mg by mouth daily as needed for allergies. Patient not taking: Reported on 07/22/2023    [provider]  Multiple Vitamins-Minerals (MULTIVITAMIN PO) Take 1 tablet by mouth daily.  Patient not taking: Reported on 07/22/2023    [provider]  vedolizumab (ENTYVIO) 300 MG injection Inject into the vein. Q 8 weks    [provider]    Current Outpatient Medications  Medication Sig Dispense Refill   calcium carbonate (TUMS) 500 MG chewable tablet Chew 1 tablet (200 mg of elemental calcium total) by mouth 3 (three) times daily. (Patient not taking: Reported on 07/22/2023) 90 tablet    Cholecalciferol (VITAMIN D) 2000 units CAPS Take 1 capsule by mouth daily.     ferrous sulfate 325 (65 FE) MG tablet Take 1 tablet by mouth daily. (Patient not taking: Reported on 07/22/2023)  loratadine (CLARITIN) 10 MG tablet Take 10 mg by mouth daily as needed for allergies. (Patient not taking: Reported on 07/22/2023)     Multiple Vitamins-Minerals (MULTIVITAMIN PO) Take 1 tablet by mouth daily.  (Patient not taking: Reported on 07/22/2023)     vedolizumab (ENTYVIO) 300 MG injection Inject into the vein. Q 8 weks     No current facility-administered medications for this visit.    Allergies as of 08/08/2023 -  Review Complete 07/22/2023  Allergen Reaction Noted   Amoxicillin Hives 02/06/2016   Remicade [infliximab] Anaphylaxis 08/03/2015   Clindamycin Other (See Comments) 10/24/2018   Lialda [mesalamine] Other (See Comments) 02/06/2016    Family History  Problem Relation Age of Onset   Hyperlipidemia Mother    Hypertension Mother    Diabetes Father    Colon cancer Father 42   Heart disease Father        AVR   Hyperlipidemia Father    Hypertension Brother    Heart disease Maternal Grandmother    Heart disease Maternal Grandfather    Mental illness Maternal Grandfather    Heart disease Paternal Grandmother    Heart disease Paternal Grandfather    Colon cancer Paternal Grandfather    Stomach cancer Neg Hx    Rectal cancer Neg Hx    Esophageal cancer Neg Hx    Colon polyps Neg Hx     Social History   Socioeconomic History   Marital status: Married    Spouse name: Dottie   Number of children: 2   Years of education: Not on file   Highest education level: Not on file  Occupational History   Occupation: Banker: [employer]  Tobacco Use   Smoking status: Never   Smokeless tobacco: Never  Substance and Sexual Activity   Alcohol use: No    Alcohol/week: 0.0 standard drinks of alcohol   Drug use: No   Sexual activity: Yes  Other Topics Concern   Not on file  Social History Narrative   Married Bradan Coopersmith CMA [facility]), 1 daughter born 2014 1 son born 2016   [employer]   Enjoys softball   Social Determinants of Health   Financial Resource Strain: Not on file  Food Insecurity: Not on file  Transportation Needs: Not on file  Physical Activity: Not on file  Stress: Not on file  Social Connections: Not on file  Intimate Partner Violence: Not on file    Review of Systems:  All other review of systems negative except as mentioned in the HPI.  Physical Exam: Vital signs There were no vitals taken for this visit.  General:    Alert,  Well-developed, well-nourished, pleasant and cooperative in NAD Lungs:  Clear throughout to auscultation.   Heart:  Regular rate and rhythm; no murmurs, clicks, rubs,  or gallops. Abdomen:  Soft, nontender and nondistended. Normal bowel sounds.   Neuro/Psych:  Alert and cooperative. Normal mood and affect. A and O x 3   @John [name]  Sena Slate, MD, [facility] [phone] (pager) 08/07/2023 5:35 PM@

## 2023-08-08 ENCOUNTER — Ambulatory Visit: Payer: Managed Care, Other (non HMO) | Admitting: Internal Medicine

## 2023-08-08 ENCOUNTER — Encounter: Payer: Self-pay | Admitting: Internal Medicine

## 2023-08-08 VITALS — BP 111/73 | HR 73 | Temp 99.1°F | Resp 15 | Ht 65.0 in | Wt 225.0 lb

## 2023-08-08 DIAGNOSIS — K5100 Ulcerative (chronic) pancolitis without complications: Secondary | ICD-10-CM

## 2023-08-08 DIAGNOSIS — K6389 Other specified diseases of intestine: Secondary | ICD-10-CM | POA: Diagnosis not present

## 2023-08-08 MED ORDER — SODIUM CHLORIDE 0.9 % IV SOLN: 500.0000 mL | Freq: Once | INTRAVENOUS | Status: DC

## 2023-08-08 NOTE — Progress Notes (Signed)
Called to room to assist during endoscopic procedure.  Patient ID and intended procedure confirmed with present staff. Received instructions for my participation in the procedure from the performing physician.  

## 2023-08-08 NOTE — Progress Notes (Signed)
Per Dr. Leone Payor- pt has multiple pseudopolyps

## 2023-08-08 NOTE — Patient Instructions (Addendum)
   The colon mucosa (lining) looks good - no inflammation. You still have numerous pseudopolyps.  Biopsies taken as usual.  I will let you know pathology results and when to have another routine colonoscopy by mail and/or My Chart.  I appreciate the opportunity to care for you. Iva Boop, MD, [facility]   Resume usual diet   Continue present medications   Await biopsies results    YOU HAD AN ENDOSCOPIC PROCEDURE TODAY AT THE [facility]:   Refer to the procedure report that was given to you for any specific questions about what was found during the examination.  If the procedure report does not answer your questions, please call your gastroenterologist to clarify.  If you requested that your care partner not be given the details of your procedure findings, then the procedure report has been included in a sealed envelope for you to review at your convenience later.  YOU SHOULD EXPECT: Some feelings of bloating in the abdomen. Passage of more gas than usual.  Walking can help get rid of the air that was put into your GI tract during the procedure and reduce the bloating. If you had a lower endoscopy (such as a colonoscopy or flexible sigmoidoscopy) you may notice spotting of blood in your stool or on the toilet paper. If you underwent a bowel prep for your procedure, you may not have a normal bowel movement for a few days.  Please Note:  You might notice some irritation and congestion in your nose or some drainage.  This is from the oxygen used during your procedure.  There is no need for concern and it should clear up in a day or so.  SYMPTOMS TO REPORT IMMEDIATELY:  Following lower endoscopy (colonoscopy or flexible sigmoidoscopy):  Excessive amounts of blood in the stool  Significant tenderness or worsening of abdominal pains  Swelling of the abdomen that is new, acute  Fever of 100F or higher   For urgent or emergent issues, a gastroenterologist can be reached at any  hour by calling [phone]. Do not use MyChart messaging for urgent concerns.    DIET:  We do recommend a small meal at first, but then you may proceed to your regular diet.  Drink plenty of fluids but you should avoid alcoholic beverages for 24 hours.  ACTIVITY:  You should plan to take it easy for the rest of today and you should NOT DRIVE or use heavy machinery until tomorrow (because of the sedation medicines used during the test).    FOLLOW UP: Our staff will call the number listed on your records the next business day following your procedure.  We will call around 7:15- 8:00 am to check on you and address any questions or concerns that you may have regarding the information given to you following your procedure. If we do not reach you, we will leave a message.     If any biopsies were taken you will be contacted by phone or by letter within the next 1-3 weeks.  Please call us at [phone] if you have not heard about the biopsies in 3 weeks.    SIGNATURES/CONFIDENTIALITY: You and/or your care partner have signed paperwork which will be entered into your electronic medical record.  These signatures attest to the fact that that the information above on your After Visit Summary has been reviewed and is understood.  Full responsibility of the confidentiality of this discharge information lies with you and/or your care-partner.

## 2023-08-08 NOTE — Progress Notes (Signed)
To pacu, VSS. Report to [email or web address] 

## 2023-08-08 NOTE — Progress Notes (Signed)
Pt's states no medical or surgical changes since previsit or office visit. 

## 2023-08-08 NOTE — Op Note (Signed)
[facility] Patient Name: John [name] Procedure Date: 08/08/2023 10:44 AM MRN: [id number] Endoscopist: Iva Boop , MD, [id number] Age: 42 Referring MD:  Date of Birth: Apr 19, 1981 Gender: Male Account #: [id number] Procedure:                Colonoscopy Indications:              High risk colon cancer surveillance: Ulcerative                            pancolitis of 8 (or more) years duration Medicines:                Monitored Anesthesia Care Procedure:                Pre-Anesthesia Assessment:                           - Prior to the procedure, a History and Physical                            was performed, and patient medications and                            allergies were reviewed. The patient's tolerance of                            previous anesthesia was also reviewed. The risks                            and benefits of the procedure and the sedation                            options and risks were discussed with the patient.                            All questions were answered, and informed consent                            was obtained. Prior Anticoagulants: The patient has                            taken no anticoagulant or antiplatelet agents. ASA                            Grade Assessment: II - A patient with mild systemic                            disease. After reviewing the risks and benefits,                            the patient was deemed in satisfactory condition to                            undergo the procedure.  After obtaining informed consent, the colonoscope                            was passed under direct vision. Throughout the                            procedure, the patient's blood pressure, pulse, and                            oxygen saturations were monitored continuously. The                            CF [id number] #[id number] was introduced through the anus                            and advanced to the  the cecum, identified by                            appendiceal orifice and ileocecal valve. The                            colonoscopy was somewhat difficult due to                            significant looping. Successful completion of the                            procedure was aided by applying abdominal pressure.                            The patient tolerated the procedure well. The                            quality of the bowel preparation was good. The                            ileocecal valve, appendiceal orifice, and rectum                            were photographed. The bowel preparation used was                            Miralax via split dose instruction. Scope In: 10:54:59 AM Scope Out: 11:14:42 AM Scope Withdrawal Time: 0 hours 15 minutes 39 seconds  Total Procedure Duration: 0 hours 19 minutes 43 seconds  Findings:                 The perianal and digital rectal examinations were                            normal. Pertinent negatives include normal prostate                            (size, shape, and consistency).  Diffuse pseudopolyps were found in the entire colon.                           The exam was otherwise without abnormality on                            direct and retroflexion views.                           Two biopsies were taken every 10 cm with a cold                            forceps from the entire colon for ulcerative                            colitis surveillance. These biopsy specimens were                            sent to Pathology. Verification of patient                            identification for the specimen was done. Estimated                            blood loss was minimal. Complications:            No immediate complications. Estimated Blood Loss:     Estimated blood loss was minimal. Impression:               - Pseudopolyps in the entire examined colon.                           - The examination was  otherwise normal on direct                            and retroflexion views. Unable to enter ileum due                            to looping.                           - Biopsies for surveillance were taken from the                            entire colon. 4 bottles cecum + Ascending;                            transverse; descending + proxima sigmoid; distal                            sigmoid and rectum. Recommendation:           - Patient has a contact number available for                            emergencies. The  signs and symptoms of potential                            delayed complications were discussed with the                            patient. Return to normal activities tomorrow.                            Written discharge instructions were provided to the                            patient.                           - Resume previous diet.                           - Continue present medications.                           - Repeat colonoscopy is recommended for                            surveillance. The colonoscopy date will be                            determined after pathology results from today's                            exam become available for review. Iva Boop, MD 08/08/2023 11:23:41 AM This report has been signed electronically.

## 2023-08-11 ENCOUNTER — Telehealth: Payer: Self-pay

## 2023-08-11 NOTE — Telephone Encounter (Signed)
  Follow up Call-     08/08/2023    9:39 AM  Call back number  Post procedure Call Back phone  # [phone]  Permission to leave phone message Yes     Patient questions:  Do you have a fever, pain , or abdominal swelling? No. Pain Score  0 *  Have you tolerated food without any problems? Yes.    Have you been able to return to your normal activities? Yes.    Do you have any questions about your discharge instructions: Diet   No. Medications  No. Follow up visit  No.  Do you have questions or concerns about your Care? No.  Actions: * If pain score is 4 or above: No action needed, pain <4.

## 2023-08-12 ENCOUNTER — Encounter: Payer: Self-pay | Admitting: Internal Medicine

## 2023-08-12 LAB — SURGICAL PATHOLOGY

## 2023-08-14 ENCOUNTER — Encounter: Payer: Managed Care, Other (non HMO) | Admitting: Dermatology

## 2023-08-21 ENCOUNTER — Ambulatory Visit: Payer: Managed Care, Other (non HMO) | Admitting: Dermatology

## 2023-08-21 ENCOUNTER — Encounter: Payer: Self-pay | Admitting: Dermatology

## 2023-08-21 VITALS — BP 122/77 | HR 75

## 2023-08-21 DIAGNOSIS — D485 Neoplasm of uncertain behavior of skin: Secondary | ICD-10-CM

## 2023-08-21 DIAGNOSIS — D492 Neoplasm of unspecified behavior of bone, soft tissue, and skin: Secondary | ICD-10-CM | POA: Diagnosis not present

## 2023-08-21 DIAGNOSIS — L920 Granuloma annulare: Secondary | ICD-10-CM | POA: Diagnosis not present

## 2023-08-21 DIAGNOSIS — L7211 Pilar cyst: Secondary | ICD-10-CM | POA: Diagnosis not present

## 2023-08-21 NOTE — Patient Instructions (Signed)

## 2023-08-21 NOTE — Progress Notes (Signed)
Follow-Up Visit   Subjective  John [name] is a 42 y.o. male who presents for the following: Excision of neoplasm of skin of right posterior shoulder. Lesions has been there for several months, not previously treated, asymptomatic.  Lesion on right neck, present for several years, asymptomatic, not previously treated  The following portions of the chart were reviewed this encounter and updated as appropriate: medications, allergies, medical history  Review of Systems:  No other skin or systemic complaints except as noted in HPI or Assessment and Plan.  Objective  Well appearing patient in no apparent distress; mood and affect are within normal limits.  A focused examination was performed of the following areas: Right posterior shoulder Relevant physical exam findings are noted in the Assessment and Plan.   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) right posterior shoulder  Skin excision  Lesion length (cm):  2.5 Lesion width (cm):  2.4 Margin per side (cm):  0.1 Total excision diameter (cm):  2.7 Informed consent: discussed and consent obtained   Timeout: patient name, date of birth, surgical site, and procedure verified   Procedure prep:  Patient was prepped and draped in usual sterile fashion Prep type:  Chlorhexidine Anesthesia: the lesion was anesthetized in a standard fashion   Anesthetic:  1% lidocaine w/ epinephrine 1-100,000 local infiltration Instrument used: #15 blade   Hemostasis achieved with: suture, pressure and electrodesiccation   Hemostasis achieved with comment:  3.0 PDS, dermabond with steri strips Outcome: patient tolerated procedure well with no complications   Post-procedure details: wound care instructions given    Skin repair Complexity:  Intermediate Final length (cm):  3.1 Informed consent: discussed and consent obtained    Timeout: patient name, date of birth, surgical site, and procedure verified   Procedure prep:  Patient was prepped and draped in usual sterile fashion Prep type:  Chlorhexidine Anesthesia: the lesion was anesthetized in a standard fashion   Anesthetic:  1% lidocaine w/ epinephrine 1-100,000 local infiltration Reason for type of repair: reduce tension to allow closure, reduce the risk of dehiscence, infection, and necrosis, preserve normal anatomy and preserve normal anatomical and functional relationships   Undermining: edges undermined   Subcutaneous layers (deep stitches):  Suture size:  3-0 Suture type: PDS (polydioxanone)   Stitches:  Buried vertical mattress Fine/surface layer approximation (top stitches):  Suture type: cyanoacrylate tissue glue   Hemostasis achieved with: suture, pressure and electrodesiccation Outcome: patient tolerated procedure well with no complications   Post-procedure details: sterile dressing applied and wound care instructions given   Dressing type: pressure dressing and bacitracin    Specimen 1 - Surgical pathology Differential Diagnosis: R/O cyst vs lipoma vs other  Check Margins: No  right neck  Skin / nail biopsy Type of biopsy: tangential   Timeout: patient name, date of birth, surgical site, and procedure verified   Procedure prep:  Patient was prepped and draped in usual sterile fashion Prep type:  Isopropyl alcohol Anesthesia: the lesion was anesthetized in a standard fashion   Anesthetic:  1% lidocaine w/ epinephrine 1-100,000 local infiltration Instrument used: DermaBlade   Hemostasis achieved with: aluminum chloride   Outcome: patient tolerated procedure well   Post-procedure details: wound care instructions given    Specimen 2 - Surgical pathology Differential Diagnosis: R/O NMSC vs GA  Check Margins: No  Return for based on biopsy results.  I, Tillie Fantasia, CMA, am acting as scribe for Gwenith Daily, MD.   Documentation:  I have reviewed the above documentation for accuracy and completeness, and I agree with the above.  Gwenith Daily, MD

## 2023-08-25 LAB — SURGICAL PATHOLOGY

## 2023-09-02 ENCOUNTER — Ambulatory Visit: Payer: Managed Care, Other (non HMO)

## 2023-09-02 VITALS — BP 130/82 | HR 78 | Temp 98.2°F | Resp 18 | Ht 65.0 in | Wt 228.4 lb

## 2023-09-02 DIAGNOSIS — K5100 Ulcerative (chronic) pancolitis without complications: Secondary | ICD-10-CM | POA: Diagnosis not present

## 2023-09-02 MED ORDER — VEDOLIZUMAB 300 MG IV SOLR
300.0000 mg | Freq: Once | INTRAVENOUS | Status: AC
  Administered 2023-09-02: 300 mg via INTRAVENOUS
  Filled 2023-09-02: qty 5

## 2023-09-02 NOTE — Progress Notes (Signed)
Diagnosis: Ulcerative Colitis  Provider:  Chilton Greathouse MD  Procedure: IV Infusion  IV Type: Peripheral, IV Location: L Hand  Entyvio (Vedolizumab), Dose: 300 mg  Infusion Start Time: 1504  Infusion Stop Time: 1540  Post Infusion IV Care: Peripheral IV Discontinued  Discharge: Condition: Good, Destination: Home . AVS Declined  Performed by:  Wyvonne Lenz, RN

## 2023-10-12 ENCOUNTER — Encounter: Payer: Self-pay | Admitting: Family Medicine

## 2023-10-13 NOTE — Telephone Encounter (Signed)
Hi Dr. Katrinka Blazing,  Is there anything that you can recommend or prescribe for pt to help with n/t related to carpal tunnel syndrome (as Dr. Denyse Amass is out of the office this week)?   Thank you in advance,   Gearldine Bienenstock, CMA

## 2023-10-20 MED ORDER — GABAPENTIN 100 MG PO CAPS: 100.0000 mg | ORAL_CAPSULE | Freq: Three times a day (TID) | ORAL | 3 refills | Status: AC | PRN

## 2023-10-20 NOTE — Addendum Note (Signed)
Addended by: Rodolph Bong on: 10/20/2023 12:35 PM   Modules accepted: Orders

## 2023-10-20 NOTE — Telephone Encounter (Signed)
Dr. Denyse Amass,  It doesn't look like Rx was sent in. Would you like to proceed with Gabapentin 200 mg at bedtime as recommended by Dr. Katrinka Blazing?

## 2023-10-28 ENCOUNTER — Ambulatory Visit: Payer: Managed Care, Other (non HMO)

## 2023-10-28 VITALS — BP 124/80 | HR 74 | Temp 97.8°F | Resp 16 | Ht 65.0 in | Wt 236.0 lb

## 2023-10-28 DIAGNOSIS — K5100 Ulcerative (chronic) pancolitis without complications: Secondary | ICD-10-CM

## 2023-10-28 MED ORDER — SODIUM CHLORIDE 0.9 % IV SOLN
300.0000 mg | Freq: Once | INTRAVENOUS | Status: AC
  Administered 2023-10-28: 300 mg via INTRAVENOUS
  Filled 2023-10-28: qty 5

## 2023-10-28 NOTE — Progress Notes (Signed)
 Diagnosis: Ulcerative Colitis  Provider:  Lonna Coder MD  Procedure: IV Infusion  IV Type: Peripheral, IV Location: L Hand  Entyvio  (Vedolizumab ), Dose: 300 mg  Infusion Start Time: 1521  Infusion Stop Time: 1553  Post Infusion IV Care: Peripheral IV Discontinued  Discharge: Condition: Good, Destination: Home . AVS Declined  Performed by:  Leita FORBES Miles, LPN

## 2023-11-27 ENCOUNTER — Other Ambulatory Visit: Payer: Self-pay | Admitting: Internal Medicine

## 2023-11-27 ENCOUNTER — Telehealth: Payer: Self-pay | Admitting: Internal Medicine

## 2023-11-27 ENCOUNTER — Encounter: Payer: Self-pay | Admitting: Internal Medicine

## 2023-11-27 DIAGNOSIS — Z7960 Long term (current) use of unspecified immunomodulators and immunosuppressants: Secondary | ICD-10-CM

## 2023-11-27 DIAGNOSIS — Z111 Encounter for screening for respiratory tuberculosis: Secondary | ICD-10-CM

## 2023-11-27 DIAGNOSIS — K5100 Ulcerative (chronic) pancolitis without complications: Secondary | ICD-10-CM

## 2023-11-27 NOTE — Telephone Encounter (Signed)
 Spoke with patient's wife Vale Garrison. She will have patient come by at earliest convenience for labs & will have PCP fax us  over most recent labs.

## 2023-11-27 NOTE — Telephone Encounter (Signed)
 Spoke with patient's wife Dottie & have filled out referral and order form for [facility]. Order form given to Dr. Willy Harvest for review & signature. Last seen for colon 07/2023 & most recent TB test 11/2022.

## 2023-11-27 NOTE — Telephone Encounter (Signed)
 Will change Entyvio  infusion to IVIX per patient request  I have ordered a quantiferon as it is time  Please have him do that and also ask PCP to send 12/24 labs to us   Windell Hasty, DO is PCP

## 2023-11-27 NOTE — Telephone Encounter (Signed)
 Form faxed to [facility]. Will send updated TB result once completed.

## 2023-11-28 NOTE — Telephone Encounter (Signed)
 Received fax stating [facility] is processing referral & will contact patient for scheduling once complete.

## 2023-12-01 ENCOUNTER — Other Ambulatory Visit: Payer: Managed Care, Other (non HMO)

## 2023-12-01 DIAGNOSIS — K5100 Ulcerative (chronic) pancolitis without complications: Secondary | ICD-10-CM

## 2023-12-01 DIAGNOSIS — Z111 Encounter for screening for respiratory tuberculosis: Secondary | ICD-10-CM

## 2023-12-01 DIAGNOSIS — Z7960 Long term (current) use of unspecified immunomodulators and immunosuppressants: Secondary | ICD-10-CM

## 2023-12-03 ENCOUNTER — Telehealth: Payer: Self-pay

## 2023-12-03 ENCOUNTER — Encounter: Payer: Self-pay | Admitting: Internal Medicine

## 2023-12-03 LAB — QUANTIFERON-TB GOLD PLUS
Mitogen-NIL: >10 [IU]/mL
NIL: 0.02 [IU]/mL
QuantiFERON-TB Gold Plus: NEGATIVE
TB1-NIL: 0.01 [IU]/mL
TB2-NIL: 0.01 [IU]/mL

## 2023-12-03 NOTE — Telephone Encounter (Signed)
Recent TB result faxed to [facility] health [phone]

## 2023-12-23 ENCOUNTER — Ambulatory Visit: Payer: Managed Care, Other (non HMO)

## 2024-01-30 ENCOUNTER — Encounter: Payer: Self-pay | Admitting: Podiatry

## 2024-01-30 ENCOUNTER — Ambulatory Visit: Admitting: Podiatry

## 2024-01-30 DIAGNOSIS — L600 Ingrowing nail: Secondary | ICD-10-CM

## 2024-01-30 MED ORDER — SULFAMETHOXAZOLE-TRIMETHOPRIM 800-160 MG PO TABS: 1.0000 | ORAL_TABLET | Freq: Two times a day (BID) | ORAL | 0 refills | Status: DC

## 2024-01-30 NOTE — Patient Instructions (Signed)

## 2024-02-01 NOTE — Progress Notes (Signed)
 Subjective:   Patient ID: John [name], male   DOB: 43 y.o.   MRN: [id number]   HPI Chief Complaint  Patient presents with   Ingrown Toenail    RM#13 right foot big toe nail ingrown and infected causing pain.    43 year old male presents the office today with concerns of ingrown toenail right big toe present on the last several weeks.  Area is tender particular with pressure.  No recent treatment.   Review of Systems  All other systems reviewed and are negative.   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   Recurrent colitis due to Clostridium difficile    Sleep apnea    UC (ulcerative colitis) (HCC)    Vitamin D deficiency 04/03/2016    Past Surgical History:  Procedure Laterality Date   BIOPSY  04/11/2018   Procedure: BIOPSY;  Surgeon: Asencion Blacksmith, MD;  Location: [facility];  Service: Endoscopy;;   COLONOSCOPY N/A 04/11/2018   Procedure: COLONOSCOPY;  Surgeon: Asencion Blacksmith, MD;  Location: [facility];  Service: Endoscopy;  Laterality: N/A;   ESOPHAGOGASTRODUODENOSCOPY N/A 07/11/2015   Procedure: ESOPHAGOGASTRODUODENOSCOPY (EGD);  Surgeon: Nannette Babe, MD;  Location: [facility];  Service: Gastroenterology;  Laterality: N/A;   FLEXIBLE SIGMOIDOSCOPY N/A 07/11/2015   Procedure: FLEXIBLE SIGMOIDOSCOPY;  Surgeon: Nannette Babe, MD;  Location: [facility];  Service: Gastroenterology;  Laterality: N/A;   FLEXIBLE SIGMOIDOSCOPY N/A 04/17/2018   Procedure: FLEXIBLE SIGMOIDOSCOPY;  Surgeon: Kenney Peacemaker, MD;  Location: [facility];  Service: Endoscopy;  Laterality: N/A;   HIP  SURGERY Left    INCISION AND DRAINAGE PERIRECTAL ABSCESS N/A 07/24/2015   Procedure: IRRIGATION AND DEBRIDEMENT PERIRECTAL ABSCESS;  Surgeon: Juanita Norlander, MD;  Location: [facility];  Service: General;  Laterality: N/A;   KNEE ARTHROSCOPY Left    TYMPANOSTOMY TUBE PLACEMENT Bilateral      Current Outpatient Medications:    Cholecalciferol (VITAMIN D) 2000 units CAPS, Take 1 capsule by mouth daily., Disp: , Rfl:    ferrous sulfate 325 (65 FE) MG tablet, Take 1 tablet by mouth daily., Disp: , Rfl:    gabapentin (NEURONTIN) 100 MG capsule, Take 1-3 capsules (100-300 mg total) by mouth 3 (three) times daily as needed., Disp: 90 capsule, Rfl: 3   loratadine (CLARITIN) 10 MG tablet, Take 10 mg by mouth daily as needed for allergies., Disp: , Rfl:    Multiple Vitamins-Minerals (MULTIVITAMIN PO), Take 1 tablet by mouth daily., Disp: , Rfl:    sulfamethoxazole-trimethoprim (BACTRIM DS) 800-160 MG tablet, Take 1 tablet by mouth 2 (two) times daily., Disp: 14 tablet, Rfl: 0   vedolizumab (ENTYVIO) 300 MG injection, Inject into the vein. Q 8 weks, Disp: , Rfl:   Allergies  Allergen Reactions   Amoxicillin Hives    Hives to trunk and arms Has patient had a PCN reaction causing immediate rash, facial/tongue/throat swelling, SOB or lightheadedness with hypotension: Yes Has patient had a PCN reaction causing severe rash involving mucus membranes or skin necrosis: No Has patient had a PCN reaction that required hospitalization: No Has patient had a PCN reaction occurring  within the last 10 years: Yes If all of the above answers are "NO", then may proceed with Cephalosporin use.   Remicade [Infliximab] Anaphylaxis   Clindamycin Other (See Comments)    c-diff   Lialda [Mesalamine] Other (See Comments)    Worsening of rectal bleeding         Objective:  Physical Exam  General: AAO x3, NAD  Dermatological: There manage in presence of right hallux toenail with localized edema and erythema with  granulation tissue present along the nail border.  There is no purulence identified today there is no ascending cellulitis.  No open lesions.  Vascular: Dorsalis Pedis artery and Posterior Tibial artery pedal pulses are 2/4 bilateral with immedate capillary fill time. There is no pain with calf compression, swelling, warmth, erythema.   Neruologic: Grossly intact via light touch bilateral.   Musculoskeletal: Tenderness on the ingrown toenail.  No other areas of discomfort.  Gait: Unassisted, Nonantalgic.       Assessment:   Ingrown toenail right hallux     Plan:  -Treatment options discussed including all alternatives, risks, and complications -Etiology of symptoms were discussed -At this time, the patient is requesting partial nail removal with chemical matricectomy to the symptomatic portion of the nail. Risks and complications were discussed with the patient for which they understand and written consent was obtained. Under sterile conditions a total of 3 mL of a mixture of 2% lidocaine plain and 0.5% Marcaine plain was infiltrated in a hallux block fashion. Once anesthetized, the skin was prepped in sterile fashion. A tourniquet was then applied. Next the symptomatic border of the hallux nail border was then sharply excised making sure to remove the entire offending nail border. Once the nails were ensured to be removed area was debrided and the underlying skin was intact. There is no purulence identified in the procedure. Next phenol was then applied under standard conditions and copiously irrigated.  Silvadene was applied. A dry sterile dressing was applied. After application of the dressing the tourniquet was removed and there is found to be an immediate capillary refill time to the digit. The patient tolerated the procedure well any complications. Post procedure instructions were discussed the patient for which he verbally understood. Discussed signs/symptoms of infection and directed to  call the office immediately should any occur or go directly to the emergency room. In the meantime, encouraged to call the office with any questions, concerns, changes symptoms. -Bactrim    Charity Conch DPM

## 2024-02-05 ENCOUNTER — Other Ambulatory Visit: Payer: Self-pay | Admitting: Podiatry

## 2024-02-05 MED ORDER — DOXYCYCLINE HYCLATE 100 MG PO TABS: 100.0000 mg | ORAL_TABLET | Freq: Two times a day (BID) | ORAL | 0 refills | Status: DC

## 2024-02-05 MED ORDER — MUPIROCIN 2 % EX OINT: 1.0000 | TOPICAL_OINTMENT | Freq: Two times a day (BID) | CUTANEOUS | 2 refills | Status: DC

## 2024-02-26 ENCOUNTER — Ambulatory Visit: Admitting: Family Medicine

## 2024-02-26 ENCOUNTER — Other Ambulatory Visit: Payer: Self-pay

## 2024-02-26 ENCOUNTER — Encounter: Payer: Self-pay | Admitting: Family Medicine

## 2024-02-26 VITALS — BP 132/84 | HR 93 | Ht 65.0 in | Wt 228.0 lb

## 2024-02-26 DIAGNOSIS — M79642 Pain in left hand: Secondary | ICD-10-CM

## 2024-02-26 DIAGNOSIS — M65311 Trigger thumb, right thumb: Secondary | ICD-10-CM

## 2024-02-26 NOTE — Patient Instructions (Addendum)
 Thank you for coming in today.   You received an injection today. Seek immediate medical attention if the joint becomes red, extremely painful, or is oozing fluid.   Let me know how this goes.   If not better we can talk surgery with a hand surgeon.

## 2024-02-26 NOTE — Progress Notes (Signed)
   Joanna Muck, PhD, LAT, ATC acting as a scribe for Garlan Juniper, MD.  John [name] is a 43 y.o. male who presents to [facility] today for R thumb and L 3rd finger. Pt's last visit w/ Dr. Alease Hunter for his L 3rd finger, was on 03/31/23, and he was given a trigger finger injection and a R carpal tunnel injection.  Today, pt reports L 3rd finger pain and triggering never resolved. Pt notes he's just adapted how he uses it.   Pt also c/o R thumb pain x 2-3 wks. Pt locates pain to the DIP joint of the R thumb. He is RHD  He notes he is gearing up to change jobs, starting June 2nd. 30-day lag till his new insurance will kick in. This will be a less physically demanding on his hands.   Pertinent review of systems: No fevers or chills  Relevant historical information: Ulcerative colitis currently treated   Exam:  BP 132/84   Pulse 93   Ht 5\' 5"  (1.651 m)   Wt 228 lb (103.4 kg)   SpO2 95%   BMI 37.94 kg/m  General: Well Developed, well nourished, and in no acute distress.   MSK: Left hand triggering or lack of flexion present at fourth digit PIP.  Right hand triggering present with flexion of thumb IP joint.    Lab and Radiology Results  Procedure: Real-time Ultrasound Guided Injection of right hand trigger thumb injection at first MCP flexor tendon sheath at A1 pulley Device: Philips Affiniti 50G/GE Logiq Images permanently stored and available for review in PACS Verbal informed consent obtained.  Discussed risks and benefits of procedure. Warned about infection, bleeding, hyperglycemia damage to structures among others. Patient expresses understanding and agreement Time-out conducted.   Noted no overlying erythema, induration, or other signs of local infection.   Skin prepped in a sterile fashion.   Local anesthesia: Topical Ethyl chloride.   With sterile technique and under real time ultrasound guidance: 40 mg of Kenalog  and 1 mL of lidocaine   injected into A1 pulley tendon sheath. Fluid seen entering the tendon sheath.   Completed without difficulty   Pain immediately resolved suggesting accurate placement of the medication.   Advised to call if fevers/chills, erythema, induration, drainage, or persistent bleeding.   Images permanently stored and available for review in the ultrasound unit.  Impression: Technically successful ultrasound guided injection.         Assessment and Plan: 43 y.o. male with right hand trigger thumb.  Plan for injection and double Band-Aid splint.  Check back as needed.  Consider surgery if not improving.   PDMP not reviewed this encounter. Orders Placed This Encounter  Procedures   US  LIMITED JOINT SPACE STRUCTURES UP BILAT(NO LINKED CHARGES)    Reason for Exam (SYMPTOM  OR DIAGNOSIS REQUIRED):   bilat hand/finger pain    Preferred imaging location?:   [facility]   No orders of the defined types were placed in this encounter.    Discussed warning signs or symptoms. Please see discharge instructions. Patient expresses understanding.   The above documentation has been reviewed and is accurate and complete Garlan Juniper, M.D.

## 2024-03-03 ENCOUNTER — Encounter: Payer: Self-pay | Admitting: Internal Medicine

## 2024-05-04 DIAGNOSIS — K5100 Ulcerative (chronic) pancolitis without complications: Secondary | ICD-10-CM | POA: Diagnosis not present

## 2024-06-29 ENCOUNTER — Telehealth: Payer: Self-pay

## 2024-06-29 NOTE — Telephone Encounter (Signed)
 Most up to date records available faxed to [facility] for Entyvio  infusion PA.

## 2024-09-24 ENCOUNTER — Encounter: Payer: Self-pay | Admitting: Internal Medicine

## 2024-10-26 ENCOUNTER — Other Ambulatory Visit: Payer: Self-pay | Admitting: Internal Medicine

## 2024-10-26 DIAGNOSIS — R7989 Other specified abnormal findings of blood chemistry: Secondary | ICD-10-CM

## 2024-11-09 ENCOUNTER — Ambulatory Visit
Admission: RE | Admit: 2024-11-09 | Discharge: 2024-11-09 | Disposition: A | Source: Ambulatory Visit | Attending: Internal Medicine

## 2024-11-09 DIAGNOSIS — R7989 Other specified abnormal findings of blood chemistry: Secondary | ICD-10-CM

## 2024-12-03 ENCOUNTER — Encounter

## 2024-12-17 ENCOUNTER — Encounter: Admitting: Internal Medicine
